# Patient Record
Sex: Female | Born: 1945 | Race: White | Hispanic: No | Smoking: Never smoker
Health system: Southern US, Community
[De-identification: ages and names within clinical notes are randomized; demographics above are authoritative.]

## PROBLEM LIST (undated history)

## (undated) DIAGNOSIS — M8440XA Pathological fracture, unspecified site, initial encounter for fracture: Secondary | ICD-10-CM

## (undated) DIAGNOSIS — C9000 Multiple myeloma not having achieved remission: Secondary | ICD-10-CM

## (undated) DIAGNOSIS — C9030 Solitary plasmacytoma not having achieved remission: Secondary | ICD-10-CM

## (undated) HISTORY — DX: Pathological fracture, unspecified site, initial encounter for fracture: M84.40XA

## (undated) HISTORY — PX: BREAST ENHANCEMENT SURGERY: SHX7

## (undated) HISTORY — DX: Solitary plasmacytoma not having achieved remission: C90.30

## (undated) HISTORY — DX: Multiple myeloma not having achieved remission: C90.00

## (undated) HISTORY — PX: BACK SURGERY: SHX140

## (undated) HISTORY — PX: LUMBAR FUSION: SHX111

---

## 2020-04-22 ENCOUNTER — Encounter: Payer: Self-pay | Admitting: *Deleted

## 2020-04-23 ENCOUNTER — Telehealth (INDEPENDENT_AMBULATORY_CARE_PROVIDER_SITE_OTHER): Payer: Medicare Other | Admitting: Registered Nurse

## 2020-04-23 ENCOUNTER — Encounter: Payer: Self-pay | Admitting: Registered Nurse

## 2020-04-23 ENCOUNTER — Other Ambulatory Visit: Payer: Self-pay

## 2020-04-23 NOTE — Patient Instructions (Signed)
° ° ° °  If you have lab work done today you will be contacted with your lab results within the next 2 weeks.  If you have not heard from us then please contact us. The fastest way to get your results is to register for My Chart. ° ° °IF you received an x-ray today, you will receive an invoice from [facility]. Please contact [facility] at [phone] with questions or concerns regarding your invoice.  ° °IF you received labwork today, you will receive an invoice from [facility]. Please contact [facility] at [phone] with questions or concerns regarding your invoice.  ° °Our billing staff will not be able to assist you with questions regarding bills from these companies. ° °You will be contacted with the lab results as soon as they are available. The fastest way to get your results is to activate your My Chart account. Instructions are located on the last page of this paperwork. If you have not heard from us regarding the results in 2 weeks, please contact this office. °  ° ° ° °

## 2020-05-01 ENCOUNTER — Other Ambulatory Visit: Payer: Self-pay

## 2020-05-01 ENCOUNTER — Ambulatory Visit: Payer: Medicare Other | Admitting: Registered Nurse

## 2020-05-01 ENCOUNTER — Encounter: Payer: Self-pay | Admitting: Registered Nurse

## 2020-05-01 VITALS — BP 170/80 | HR 104 | Temp 98.1°F | Ht 66.0 in | Wt 166.0 lb

## 2020-05-01 DIAGNOSIS — C9000 Multiple myeloma not having achieved remission: Secondary | ICD-10-CM

## 2020-05-01 DIAGNOSIS — R52 Pain, unspecified: Secondary | ICD-10-CM | POA: Diagnosis not present

## 2020-05-01 MED ORDER — GABAPENTIN 100 MG PO CAPS: 100.0000 mg | ORAL_CAPSULE | Freq: Three times a day (TID) | ORAL | 0 refills | Status: DC

## 2020-05-01 MED ORDER — MELOXICAM 15 MG PO TABS: 15.0000 mg | ORAL_TABLET | Freq: Every day | ORAL | 0 refills | Status: DC

## 2020-05-01 MED ORDER — HYDROCODONE-ACETAMINOPHEN 10-325 MG PO TABS: 1.0000 | ORAL_TABLET | ORAL | 0 refills | Status: DC | PRN

## 2020-05-01 NOTE — Progress Notes (Signed)
New Patient Office Visit  Subjective:  Patient ID: Lisa Lester, female    DOB: 04/11/1946  Age: 74 y.o. MRN: [id number]  CC:  Chief Complaint  Patient presents with  . Transitions Of Care    referral to Onocology and Pain    HPI Lisa Lester presents for visit to est care.  She is a pleasant 74 y.o. female who has recently moved to live with her family members for further support through her treatment of multiple myeloma.   Recent work up, in brief: 04/09/20: CT CAP notes posterior surgical fusion of L2 and L4 with bilateral transpedicular screws and bridging rods. Hardware intact. Underlying lytic lesion at L3 extending into L pedicle, consistent with known hx of plasmacytoma. Numerous lytic lesions noted in chest, abdomen, and pelvis. No pathologic fractures noted. T11 has stable mild superior endplate compression deformity.  04/09/20: bone survey also notes multiple small lytic lesions in skull, question of one in mandible, other findings correlated with CT CAP. Consistent with suspicion for MM.   04/10/20: labs show WBC 6.3, hgb 13.2, MCV 91, Plt 292, hypercalcemic to 11.6, normal renal function. IgG 1130, IgA dec at 48, IgM 40. SPEP with monoclonal abn peak at 0.8, serum immunofixation with IgG kappa monoclonal paraprotein, kappa lambda ratio elevated at 59.05.  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    At this time, patient is focused on two main concerns: getting established with Onc here in [place] to explore treatment options as well as getting established with Palliative Care and Pain management given the ongoing severe pain that is worst in her ribs and back. At this time she has been taking Norco 10-325 mg PO  q6h PRN, taking just about every 6 hours. Has been intermittently taking meloxicam 10m PO qd PRN and gabapentin 1071mPO tid PRN. Reports incomplete relief. Needs refills[place] prior to establishing with pain management.   Previously had been on adderall and alprazolam for controls. She has not been taking these recently. Family members report episodes of sedation and perhaps some psychosis occurring when she has taken alprazolam in the past. Pt states that most of her anxiety was surrounding recent move and ongoing pain - feels better now that she has made the move and will be setting up care.     Past Medical History:  Diagnosis Date  . [place] myel[place] ([facility] [place]  . Pathologic fracture    L3  . [facility]    Past Surgical History:  Procedure Laterality Date  . BACK SURGERY    . BREAST ENHANCEMENT SURGERY Bilateral   . LUMBAR FUSION     L2 - L4, hx of pathologic fx of L3    Family History  Problem Relation Age of Onset  . Cancer - Other Mother   . Cancer - Other Father     Social History   Socioeconomic History  . Marital status: Widowed    Spouse name: russell Gleed  . Number of children: 0  . Years of education: 1245. Highest education level: 12th grade  Occupational History  . Not on file  Tobacco Use  . Smoking status: Never Smoker  . Smokeless tobacco: Never Used  Vaping Use  .  Vaping Use: Never used  Substance and Sexual Activity  . Alcohol use: Never  . Drug use: Never  . Sexual activity: Not Currently  Other Topics Concern  . Not on file  Social History Narrative  . Not on file   Social Determinants of Health   Financial Resource Strain:   . Difficulty of Paying Living Expenses: Not on file  Food Insecurity:   . Worried About Charity fundraiser in the Last Year: Not on file  . Ran Out of Food in the Last Year: Not on file  Transportation Needs:   . Lack of Transportation (Medical): Not on  file  . Lack of Transportation (Non-Medical): Not on file  Physical Activity:   . Days of Exercise per Week: Not on file  . Minutes of Exercise per Session: Not on file  Stress:   . Feeling of Stress : Not on file  Social Connections:   . Frequency of Communication with Friends and Family: Not on file  . Frequency of Social Gatherings with Friends and Family: Not on file  . Attends Religious Services: Not on file  . Active Member of Clubs or Organizations: Not on file  . Attends Archivist Meetings: Not on file  . Marital Status: Not on file  Intimate Partner Violence:   . Fear of Current or Ex-Partner: Not on file  . Emotionally Abused: Not on file  . Physically Abused: Not on file  . Sexually Abused: Not on file    ROS Review of Systems  Constitutional: Negative.   HENT: Negative.   Eyes: Negative.   Respiratory: Negative.   Cardiovascular: Negative.   Gastrointestinal: Negative.   Genitourinary: Negative.   Musculoskeletal: Positive for arthralgias and back pain.  Skin: Negative.   Neurological: Negative.   Psychiatric/Behavioral: Negative.     Objective:   Today's Vitals: BP (!) 170/80   Pulse (!) 104   Temp 98.1 F (36.7 C) (Temporal)   Ht '5\' 6"'  (1.676 m)   Wt 166 lb (75.3 kg)   SpO2 97%   BMI 26.79 kg/m   Physical Exam Vitals and nursing note reviewed.  Constitutional:      Appearance: Normal appearance.  Cardiovascular:     Rate and Rhythm: Normal rate and regular rhythm.     Heart sounds: Normal heart sounds.  Pulmonary:     Effort: Pulmonary effort is normal. No respiratory distress.     Breath sounds: Normal breath sounds.  Musculoskeletal:     Cervical back: Normal range of motion and neck supple.  Skin:    General: Skin is warm and dry.     Capillary Refill: Capillary refill takes less than 2 seconds.  Neurological:     General: No focal deficit present.     Mental Status: She is alert and oriented to person, place, and time.  Mental status is at baseline.  Psychiatric:        Mood and Affect: Mood normal.        Behavior: Behavior normal.        Thought Content: Thought content normal.        Judgment: Judgment normal.     Assessment & Plan:   Problem List Items Addressed This Visit    None    Visit Diagnoses    Multiple myeloma not having achieved remission ([place])    -  Primary   Relevant Medications   meloxicam (MOBIC) 15 MG tablet   gabapentin (NEURONTIN) 100 MG capsule  HYDROcodone-acetaminophen (NORCO) 10-325 MG tablet   Other Relevant Orders   Ambulatory referral to Oncology   Ambulatory referral to Pain Clinic   Amb Referral to Palliative Care   Severe pain       Relevant Medications   meloxicam (MOBIC) 15 MG tablet   gabapentin (NEURONTIN) 100 MG capsule   HYDROcodone-acetaminophen (NORCO) 10-325 MG tablet   Other Relevant Orders   Ambulatory referral to Pain Clinic   Amb Referral to Palliative Care      Outpatient Encounter Medications as of 05/01/2020  Medication Sig  . alprazolam (XANAX) 2 MG tablet Take 2 mg by mouth at bedtime as needed for sleep.  Marland Kitchen HYDROcodone-acetaminophen (NORCO) 10-325 MG tablet Take 1 tablet by mouth every 4 (four) hours as needed.  . meloxicam (MOBIC) 15 MG tablet Take 1 tablet (15 mg total) by mouth daily.  . [DISCONTINUED] HYDROcodone-acetaminophen (NORCO) 10-325 MG tablet Take 1 tablet by mouth every 6 (six) hours as needed.  . [DISCONTINUED] meloxicam (MOBIC) 15 MG tablet Take 15 mg by mouth daily.  Marland Kitchen amphetamine-dextroamphetamine (ADDERALL XR) 20 MG 24 hr capsule Take 20 mg by mouth daily. (Patient not taking: Reported on 05/01/2020)  . carvedilol (COREG) 6.25 MG tablet Take 6.25 mg by mouth 2 (two) times daily with a meal. (Patient not taking: Reported on 04/23/2020)  . cyclobenzaprine (FLEXERIL) 10 MG tablet Take 10 mg by mouth 2 (two) times daily. (Patient not taking: Reported on 05/01/2020)  . gabapentin (NEURONTIN) 100 MG capsule Take 1 capsule (100  mg total) by mouth 3 (three) times daily.  . methocarbamol (ROBAXIN) 500 MG tablet Take 250 mg by mouth in the morning and at bedtime. (Patient not taking: Reported on 05/01/2020)  . [DISCONTINUED] gabapentin (NEURONTIN) 100 MG capsule Take 100 mg by mouth 3 (three) times daily. (Patient not taking: Reported on 05/01/2020)   No facility-administered encounter medications on file as of 05/01/2020.    Follow-up: No follow-ups on file.   PLAN  Refill Norco. May increase frequency to q4h but closely monitor for sedation. Please supplement with gabapentin 163m PO tid PRN, may take up to 3014mbefore bed if needed, and meloxicam 153mO qd. Discussed efficacy of pain management is difficult to achieve given the lytic lesions in the bones, which can be extremely painful. Pain medicine will be best able to suit her needs.  Refer to pain clinic and palliative care.  Refer to Onc. Patient is looking forward to establishing with [facility] and would be happy to explore all options for treatment, [facility] Patient encouraged to call clinic with any questions, comments, or concerns.  I spent 65 minutes with this patient, more than 50% of which was spent counseling and/or educating.  RicMaximiano CossP

## 2020-05-01 NOTE — Patient Instructions (Signed)
° ° ° °  If you have lab work done today you will be contacted with your lab results within the next 2 weeks.  If you have not heard from us then please contact us. The fastest way to get your results is to register for My Chart. ° ° °IF you received an x-ray today, you will receive an invoice from [facility]. Please contact [facility] at [phone] with questions or concerns regarding your invoice.  ° °IF you received labwork today, you will receive an invoice from [facility]. Please contact [facility] at [phone] with questions or concerns regarding your invoice.  ° °Our billing staff will not be able to assist you with questions regarding bills from these companies. ° °You will be contacted with the lab results as soon as they are available. The fastest way to get your results is to activate your My Chart account. Instructions are located on the last page of this paperwork. If you have not heard from us regarding the results in 2 weeks, please contact this office. °  ° ° ° °

## 2020-05-02 ENCOUNTER — Telehealth: Payer: Self-pay | Admitting: Hematology

## 2020-05-02 ENCOUNTER — Encounter: Payer: Self-pay | Admitting: Registered Nurse

## 2020-05-02 ENCOUNTER — Telehealth: Payer: Self-pay | Admitting: Hematology and Oncology

## 2020-05-02 NOTE — Telephone Encounter (Signed)
Received a new pt referral from Dr. Orland Mustard for multiple myeloma. Ms. Lisa Lester has been cld and scheduled to see Dr. Irene Limbo on 11/9 at 3pm. Pt aware to arrive 15 minutes early.

## 2020-05-02 NOTE — Telephone Encounter (Signed)
Error

## 2020-05-03 ENCOUNTER — Telehealth: Payer: Self-pay | Admitting: Hospice

## 2020-05-03 NOTE — Telephone Encounter (Signed)
Spoke with patient regarding Palliative referral/services, all questions were answered and she was in agreement with scheduling visit.  Patient wanted to call me back when her Niece, Leroy Kennedy, returned home to finish the scheduling process.  *Patient is living with nephew and niece*

## 2020-05-06 ENCOUNTER — Telehealth: Payer: Self-pay | Admitting: Hospice

## 2020-05-06 NOTE — Telephone Encounter (Signed)
Rec'd message late Friday that Leroy Kennedy (niece) had called me back about scheduling visit.  Returned call to niece and have scheduled an [place] for 05/17/20 @ 8:30 AM.

## 2020-05-07 ENCOUNTER — Inpatient Hospital Stay: Payer: Medicare Other | Attending: Hematology | Admitting: Hematology

## 2020-05-07 ENCOUNTER — Other Ambulatory Visit: Payer: Self-pay

## 2020-05-07 VITALS — BP 145/74 | HR 96 | Temp 98.8°F | Resp 17 | Ht 66.0 in | Wt 165.4 lb

## 2020-05-07 DIAGNOSIS — C9000 Multiple myeloma not having achieved remission: Secondary | ICD-10-CM | POA: Diagnosis not present

## 2020-05-07 DIAGNOSIS — Z79899 Other long term (current) drug therapy: Secondary | ICD-10-CM | POA: Insufficient documentation

## 2020-05-07 DIAGNOSIS — Z7189 Other specified counseling: Secondary | ICD-10-CM | POA: Diagnosis not present

## 2020-05-07 DIAGNOSIS — Z5111 Encounter for antineoplastic chemotherapy: Secondary | ICD-10-CM | POA: Insufficient documentation

## 2020-05-07 DIAGNOSIS — K5900 Constipation, unspecified: Secondary | ICD-10-CM | POA: Insufficient documentation

## 2020-05-07 MED ORDER — DEXAMETHASONE 4 MG PO TABS: 20.0000 mg | ORAL_TABLET | ORAL | 2 refills | Status: DC

## 2020-05-07 MED ORDER — DULOXETINE HCL 30 MG PO CPEP: 30.0000 mg | ORAL_CAPSULE | Freq: Every day | ORAL | 3 refills | Status: DC

## 2020-05-07 MED ORDER — SENNOSIDES-DOCUSATE SODIUM 8.6-50 MG PO TABS: 2.0000 | ORAL_TABLET | Freq: Every day | ORAL | 1 refills | Status: DC

## 2020-05-07 MED ORDER — POLYETHYLENE GLYCOL 3350 17 G PO PACK: 17.0000 g | PACK | Freq: Every day | ORAL | 1 refills | Status: AC

## 2020-05-07 NOTE — Progress Notes (Signed)
HEMATOLOGY/ONCOLOGY CONSULTATION NOTE  Date of Service: 05/07/2020  Patient Care Team: [name], NP as PCP - General (Adult Health Nurse Practitioner)  CHIEF COMPLAINTS/PURPOSE OF CONSULTATION:  Multiple Myeloma  HISTORY OF PRESENTING ILLNESS:  Lisa Lester is a wonderful 74 y.o. female who has been referred to [place] by Dr. 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 Pt is accompanied today by her nephew. The pt reports that she is doing well overall.   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 To treat this the pt had a Kyphoplasty and radiation therapy. Pt also had a BM Bx, urinalysis, and a bone scan completed at the time. There was no official diagnosis of MGUS or Multiple Myeloma at this time, but they continued to monitor.   A few weeks ago ptt began having significant pain in her chest, which was thought to be a fractured sternum. Pt describes the pain as "flesh being ripped off of her bones". She was also having left-sided neck and back discomfort at this time. Pt was taken to the hospital where her pain was treated and she received a right Iliac Bx, BM Bx, and CT Chest while admitted. They did not complete any whole body imaging at this time.   She had a root canal nearly 1.5 months ago. Pt discontinued taking Thyroid replacement 5-6 years ago. Pt is also no longer taking any HTN medications. She was asked to restart Gabapentin yesterday, which she was given for pain control, but felt ill after taking it. She continues taking Xanax and Hydrocodone every 4 hours. Pt reports some constipation and takes Colace occasionally.   Pt had COVID19 in July of 2020. Her husband also had the virus which caused his passing. Pt has received two doses of the COVID19 vaccine but has not received the booster.  Of note since the patient's last visit, pt has had CT Chest ([id number]) completed on  04/09/2020 with results revealing "1. Chronic posterior surgical fusion of L2 and L4 with bilateral transpedicular screws and bridging rods. Hardware intact appearing. 2. Status post vertebroplasty of L3. There is an underlying lytic lesion at the L3 vertebral body which extends into its left pedicle. These findings correlate with the provided history of a known plasmacytoma. 3. There are numerous lytic skeletal lesions at the chest, abdomen and pelvis, more numerous at the spine and ribs. 4. Multiple lytic lesions are present at the sternum, with interval development of a pathologic fracture at the sternum without significant displacement. 5. Stable mild superior endplate compression deformity at T11. 6. 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 Similar findings were present on prior study from 03/30/12, the degree of biliary ductal dilation has worsened as compared to the prior study. No choledochal stone identified.   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  Most recent lab results (04/11/2020) of CBC w/diff is as follows: all values are WNL except for Lymphs Rel at  18.5. 04/10/2020 CMP is as follows: all values are WNL except for CO2 at 33, Glucose at 120, Calcium at 11.6, Anion gap at 3.  On review of systems, pt reports constipation, chest pain, neck pain, back pain and denies dysuria and any other symptoms.   On PMHx the pt reports  Kyphoplasty, Plasmacytoma, HTN, Thyroid disorder. On Social Hx the pt reports that she is a non-smoker and only drinks alcohol socially.   MEDICAL HISTORY:  Past Medical History:  Diagnosis Date  . Multiple myeloma ([place])   . Pathologic fracture    L3  . Plasmacytoma ([place])     SURGICAL HISTORY: Past Surgical History:  Procedure Laterality Date  . BACK SURGERY    . BREAST ENHANCEMENT SURGERY Bilateral   . LUMBAR FUSION     L2 - L4, hx of pathologic fx of L3    SOCIAL HISTORY: Social History   Socioeconomic History  . Marital status: Widowed    Spouse name: russell Klink  . Number of children: 0  . Years of education: 70  . Highest education level: 12th grade  Occupational History  . Not on file  Tobacco Use  . Smoking status: Never Smoker  . Smokeless tobacco: Never Used  Vaping Use  . Vaping Use: Never used  Substance and Sexual Activity  . Alcohol use: Never  . Drug use: Never  . Sexual activity: Not Currently  Other Topics Concern  . Not on file  Social History Narrative  . Not on file   Social Determinants of Health   Financial Resource Strain:   . Difficulty of Paying Living Expenses: Not on file  Food Insecurity:   . Worried About Charity fundraiser in the Last Year: Not on file  . Ran Out of Food in the Last Year: Not on file  Transportation Needs:   . Lack of Transportation (Medical): Not on file  . Lack of Transportation (Non-Medical): Not on file  Physical Activity:   . Days of Exercise per Week: Not on file  . Minutes of Exercise per Session: Not on file  Stress:   . Feeling of Stress : Not on file  Social Connections:   . Frequency of Communication with Friends and Family: Not on file  . Frequency of Social Gatherings with Friends and Family: Not on file  . Attends Religious Services: Not on file  . Active Member of Clubs or Organizations: Not on file  . Attends Archivist Meetings: Not on file  . Marital Status: Not on file   Intimate Partner Violence:   . Fear of Current or Ex-Partner: Not on file  . Emotionally Abused: Not on file  . Physically Abused: Not on file  . Sexually Abused: Not on file    FAMILY HISTORY: Family History  Problem Relation Age of Onset  . Cancer - Other Mother   . Cancer - Other Father     ALLERGIES:  has No Known Allergies.  MEDICATIONS:  Current Outpatient Medications  Medication Sig Dispense Refill  . alprazolam (XANAX) 2 MG tablet Take 2 mg by mouth at bedtime as needed for sleep.    Marland Kitchen amphetamine-dextroamphetamine (ADDERALL XR) 20 MG 24 hr capsule Take 20 mg by mouth daily. (Patient not taking: Reported on 05/01/2020)    . carvedilol (COREG) 6.25 MG tablet Take 6.25 mg by mouth 2 (two) times daily with a meal. (Patient not taking: Reported on 04/23/2020)    . cyclobenzaprine (FLEXERIL) 10 MG tablet Take  10 mg by mouth 2 (two) times daily. (Patient not taking: Reported on 05/01/2020)    . gabapentin (NEURONTIN) 100 MG capsule Take 1 capsule (100 mg total) by mouth 3 (three) times daily. 180 capsule 0  . HYDROcodone-acetaminophen (NORCO) 10-325 MG tablet Take 1 tablet by mouth every 4 (four) hours as needed. 120 tablet 0  . meloxicam (MOBIC) 15 MG tablet Take 1 tablet (15 mg total) by mouth daily. 90 tablet 0  . methocarbamol (ROBAXIN) 500 MG tablet Take 250 mg by mouth in the morning and at bedtime. (Patient not taking: Reported on 05/01/2020)     No current facility-administered medications for this visit.    REVIEW OF SYSTEMS:    10 Point review of Systems was done is negative except as noted above.  PHYSICAL EXAMINATION: ECOG PERFORMANCE STATUS: 2 - Symptomatic, <50% confined to bed  . Vitals:   05/07/20 1519  BP: (!) 145/74  Pulse: 96  Resp: 17  Temp: 98.8 F (37.1 C)  SpO2: 98%   Filed Weights   05/07/20 1519  Weight: 165 lb 6.4 oz (75 kg)   .Body mass index is 26.7 kg/m.  GENERAL:alert, in no acute distress and comfortable SKIN: no acute rashes, no  significant lesions EYES: conjunctiva are pink and non-injected, sclera anicteric OROPHARYNX: MMM, no exudates, no oropharyngeal erythema or ulceration NECK: supple, no JVD LYMPH:  no palpable lymphadenopathy in the cervical, axillary or inguinal regions LUNGS: clear to auscultation b/l with normal respiratory effort HEART: regular rate & rhythm ABDOMEN:  normoactive bowel sounds , non tender, not distended. Extremity: no pedal edema PSYCH: alert & oriented x 3 with fluent speech NEURO: no focal motor/sensory deficits  LABORATORY DATA:  I have reviewed the data as listed  .No flowsheet data found.  .No flowsheet data found.   RADIOGRAPHIC STUDIES: I have personally reviewed the radiological images as listed and agreed with the findings in the report. No results found.  ASSESSMENT & PLAN:   74 yo with   1) Newly diagnosed multiple myeloma Previous h/o plasmacytomas PLAN: -Discussed patient's most recent labs from 04/11/2020 & 04/12/2020, no anemia, some hypercalcemia, no apparent renal dysfunction. 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  -Advised pt that this is not multiple plasmacytomas, but is active Multiple Myeloma.  -Advised pt that although Multiple Myeloma is not curable it is treatable.  -Advised pt that genetic studies are important for risk stratification in Multiple Myelomas.  -Recommend pt be cautious of increase sedation when taking Xanax with Opioids.  -Discussed CDC guidelines regarding the COVID19 booster. Recommend pt receive it soon. -Recommend pt receive the annual flu vaccine. Wait two weeks between vaccinations if possible. -Advised pt that we typically hold bisphosphonates for at least 2 months after extensive dental procedures. -Advised pt that we would prefer to complete a w/o that includes: labs, PET/CT scan, and a repeat BM Bx.  -Advised pt that we would prefer to start on Velcade + Revlimid, provided no  concerning genetic findings.  -Recommend pt take Miralax daily and 2 tables of Senna at night time.  -Plan to begin Zometa in 1-2 weeks.  -Rx Cymbalta, Senna w/stool softener, Dexamethasone -Will get BM Bx and labs  -Will get PET/CT    FOLLOW UP: Labs within 1-2 days PET/CT ASAP in 1 weeks CT bone marrow aspiration and biopsy in 1 week Chemo-counseling for VRD and zometa Schedule to start VRD+ Zometa in 2 weeks with labs and MD visit  All of the patients questions were answered with apparent satisfaction. The patient knows to call the clinic with any problems, questions or concerns.  I spent 45 mins counseling the patient face to face. The total time spent in the appointment was 80 minutes and more than 50% was on counseling and direct patient cares, co-ordinating cares, ordering tests and treatment.    Sullivan Lone MD [place] AAHIVMS [facility] Hematology/Oncology Physician [facility]  (Office):       [phone] (Work cell):  [phone] (Fax):           [phone]  05/07/2020 11:05 AM  I, Yevette Edwards, am acting as a scribe for Dr. Sullivan Lone.   .I have reviewed the above documentation for accuracy and completeness, and I agree with the above. Brunetta Genera MD

## 2020-05-13 ENCOUNTER — Telehealth: Payer: Self-pay | Admitting: *Deleted

## 2020-05-13 DIAGNOSIS — C9000 Multiple myeloma not having achieved remission: Secondary | ICD-10-CM | POA: Insufficient documentation

## 2020-05-13 DIAGNOSIS — Z7189 Other specified counseling: Secondary | ICD-10-CM | POA: Insufficient documentation

## 2020-05-13 MED ORDER — LENALIDOMIDE 15 MG PO CAPS: 15.0000 mg | ORAL_CAPSULE | Freq: Every day | ORAL | 0 refills | Status: DC

## 2020-05-13 MED ORDER — PROCHLORPERAZINE MALEATE 10 MG PO TABS: 10.0000 mg | ORAL_TABLET | Freq: Four times a day (QID) | ORAL | 1 refills | Status: DC | PRN

## 2020-05-13 MED ORDER — DEXAMETHASONE 4 MG PO TABS: ORAL_TABLET | ORAL | 5 refills | Status: DC

## 2020-05-13 MED ORDER — ASPIRIN EC 325 MG PO TBEC: 325.0000 mg | DELAYED_RELEASE_TABLET | Freq: Every day | ORAL | 5 refills | Status: DC

## 2020-05-13 MED ORDER — ONDANSETRON HCL 8 MG PO TABS: 8.0000 mg | ORAL_TABLET | Freq: Two times a day (BID) | ORAL | 1 refills | Status: DC | PRN

## 2020-05-13 MED ORDER — ACYCLOVIR 400 MG PO TABS: 400.0000 mg | ORAL_TABLET | Freq: Two times a day (BID) | ORAL | 3 refills | Status: DC

## 2020-05-13 NOTE — Telephone Encounter (Signed)
Niece Leroy Kennedy called on patient's behalf. Patient seen by Dr. Irene Limbo 11/9. Per discussion with Dr. Irene Limbo, patient to have PET, biopsy and lab work done.  When will that be scheduled? Dr. Irene Limbo informed.  Per Dr. Irene Limbo, labs to be done in next few days (schedule message sent). PET and biopsy to be scheduled by radiology once authorized. Contacted Ms. Greer with this information - left voice mail and asked that office be contacted by mid week if they have not been contacted by scheduling

## 2020-05-13 NOTE — Progress Notes (Signed)
START ON PATHWAY REGIMEN - Multiple Myeloma and Other Plasma Cell Dyscrasias     A cycle is every 21 days:     Bortezomib      Lenalidomide      Dexamethasone   **Always confirm dose/schedule in your pharmacy ordering system**  Patient Characteristics: Multiple Myeloma, Newly Diagnosed, Transplant Eligible, Unknown or Awaiting Test Results Disease Classification: Multiple Myeloma R-ISS Staging: Unknown Therapeutic Status: Newly Diagnosed Is Patient Eligible for Transplant<= Transplant Eligible Risk Status: Awaiting Test Results Intent of Therapy: Non-Curative / Palliative Intent, Discussed with Patient 

## 2020-05-15 ENCOUNTER — Telehealth: Payer: Self-pay

## 2020-05-15 ENCOUNTER — Other Ambulatory Visit: Payer: Self-pay

## 2020-05-15 ENCOUNTER — Inpatient Hospital Stay: Payer: Medicare Other

## 2020-05-15 ENCOUNTER — Telehealth: Payer: Self-pay | Admitting: Pharmacist

## 2020-05-15 ENCOUNTER — Encounter: Payer: Self-pay | Admitting: Hematology

## 2020-05-15 DIAGNOSIS — C9000 Multiple myeloma not having achieved remission: Secondary | ICD-10-CM | POA: Diagnosis not present

## 2020-05-15 DIAGNOSIS — K5900 Constipation, unspecified: Secondary | ICD-10-CM | POA: Diagnosis not present

## 2020-05-15 DIAGNOSIS — Z79899 Other long term (current) drug therapy: Secondary | ICD-10-CM | POA: Diagnosis not present

## 2020-05-15 DIAGNOSIS — Z5111 Encounter for antineoplastic chemotherapy: Secondary | ICD-10-CM | POA: Diagnosis not present

## 2020-05-15 DIAGNOSIS — Z7189 Other specified counseling: Secondary | ICD-10-CM

## 2020-05-15 LAB — CMP (CANCER CENTER ONLY)
ALT: 8 U/L (ref 0–44)
AST: 10 U/L — ABNORMAL LOW (ref 15–41)
Albumin: 3.7 g/dL (ref 3.5–5.0)
Alkaline Phosphatase: 106 U/L (ref 38–126)
Anion gap: 9 (ref 5–15)
BUN: 15 mg/dL (ref 8–23)
CO2: 29 mmol/L (ref 22–32)
Calcium: 9.6 mg/dL (ref 8.9–10.3)
Chloride: 100 mmol/L (ref 98–111)
Creatinine: 0.98 mg/dL (ref 0.44–1.00)
GFR, Estimated: >60 mL/min (ref >60)
Glucose, Bld: 108 mg/dL — ABNORMAL HIGH (ref 70–99)
Potassium: 4 mmol/L (ref 3.5–5.1)
Sodium: 138 mmol/L (ref 135–145)
Total Bilirubin: 0.4 mg/dL (ref 0.3–1.2)
Total Protein: 7.1 g/dL (ref 6.5–8.1)

## 2020-05-15 LAB — CBC WITH DIFFERENTIAL/PLATELET
Abs Immature Granulocytes: 0.05 10*3/uL (ref 0.00–0.07)
Basophils Absolute: 0.1 10*3/uL (ref 0.0–0.1)
Basophils Relative: 1 %
Eosinophils Absolute: 0.1 10*3/uL (ref 0.0–0.5)
Eosinophils Relative: 2 %
HCT: 36.7 % (ref 36.0–46.0)
Hemoglobin: 12.1 g/dL (ref 12.0–15.0)
Immature Granulocytes: 1 %
Lymphocytes Relative: 35 %
Lymphs Abs: 1.9 10*3/uL (ref 0.7–4.0)
MCH: 30.4 pg (ref 26.0–34.0)
MCHC: 33 g/dL (ref 30.0–36.0)
MCV: 92.2 fL (ref 80.0–100.0)
Monocytes Absolute: 0.5 10*3/uL (ref 0.1–1.0)
Monocytes Relative: 9 %
Neutro Abs: 2.9 10*3/uL (ref 1.7–7.7)
Neutrophils Relative %: 52 %
Platelets: 329 10*3/uL (ref 150–400)
RBC: 3.98 MIL/uL (ref 3.87–5.11)
RDW: 12.8 % (ref 11.5–15.5)
WBC: 5.6 10*3/uL (ref 4.0–10.5)
nRBC: 0 % (ref 0.0–0.2)

## 2020-05-15 LAB — HEPATITIS C ANTIBODY: HCV Ab: NONREACTIVE

## 2020-05-15 LAB — SAMPLE TO BLOOD BANK

## 2020-05-15 LAB — HEPATITIS B SURFACE ANTIGEN: Hepatitis B Surface Ag: NONREACTIVE

## 2020-05-15 LAB — HEPATITIS B CORE ANTIBODY, TOTAL: Hep B Core Total Ab: NONREACTIVE

## 2020-05-15 LAB — SEDIMENTATION RATE: Sed Rate: 27 mm/hr — ABNORMAL HIGH (ref 0–22)

## 2020-05-15 MED ORDER — LENALIDOMIDE 15 MG PO CAPS: 15.0000 mg | ORAL_CAPSULE | Freq: Every day | ORAL | 0 refills | Status: DC

## 2020-05-15 NOTE — Progress Notes (Signed)
Met with patient at registration to introduce myself as Financial Resource Specialist and to offer available resources.  Discussed one-time $1000 [employer] grant and qualifications to assist with personal expenses while going through treatment.  Gave her my card if interested in applying and for any additional financial questions or concerns.   

## 2020-05-15 NOTE — Telephone Encounter (Signed)
Oral Oncology Pharmacist Encounter  Received new prescription for Revlimid (lenalidomide) for the treatment of newly diagnosed multiple myeloma in conjunction with Velcade and dexamethasone, planned for duration of induction therapy or unacceptable drug toxicity.  CBC w/ Diff from 04/11/20 and BMP from 04/12/20 assessed, labs overall stable. Repeat baseline labs are ordered prior to therapy initiation. Patient also scheduled for PET/CT scan and repeat BM Bx prior to planned initiation of therapy.   Current medication list in Epic reviewed, no relevant/significant DDIs with Revlimid identified.  Evaluated chart and no patient barriers to medication adherence noted.   Patient will fill Revlimid through [facility] due to insurance requirements and given that Revlimid is a limited distribution medication.   Oral Oncology Clinic will continue to follow for insurance authorization, copayment issues, initial counseling and start date.  Lisa Lester, PharmD, BCPS Hematology/Oncology Clinical Pharmacist [facility] [phone] 05/15/2020 12:19 PM

## 2020-05-15 NOTE — Telephone Encounter (Signed)
**Note Lisa-Identified via Obfuscation** Oral Oncology Patient Advocate Encounter  Received notification from [facility] that prior authorization for Revlimid is required.  PA submitted on CoverMyMeds Key [id number] Status is pending  Oral Oncology Clinic will continue to follow.  Lisa Lester Patient [place] Phone [phone] Fax [phone] 05/15/2020 10:55 AM

## 2020-05-16 ENCOUNTER — Other Ambulatory Visit: Payer: Self-pay | Admitting: Hematology

## 2020-05-16 ENCOUNTER — Encounter: Payer: Self-pay | Admitting: Hematology

## 2020-05-16 LAB — MULTIPLE MYELOMA PANEL, SERUM
Albumin SerPl Elph-Mcnc: 3.5 g/dL (ref 2.9–4.4)
Albumin/Glob SerPl: 1.2 (ref 0.7–1.7)
Alpha 1: 0.3 g/dL (ref 0.0–0.4)
Alpha2 Glob SerPl Elph-Mcnc: 0.9 g/dL (ref 0.4–1.0)
B-Globulin SerPl Elph-Mcnc: 0.9 g/dL (ref 0.7–1.3)
Gamma Glob SerPl Elph-Mcnc: 1 g/dL (ref 0.4–1.8)
Globulin, Total: 3.1 g/dL (ref 2.2–3.9)
IgA: 36 mg/dL — ABNORMAL LOW (ref 64–422)
IgG (Immunoglobin G), Serum: 1000 mg/dL (ref 586–1602)
IgM (Immunoglobulin M), Srm: 35 mg/dL (ref 26–217)
M Protein SerPl Elph-Mcnc: 0.7 g/dL — ABNORMAL HIGH
Total Protein ELP: 6.6 g/dL (ref 6.0–8.5)

## 2020-05-16 LAB — BETA 2 MICROGLOBULIN, SERUM: Beta-2 Microglobulin: 3.1 mg/L — ABNORMAL HIGH (ref 0.6–2.4)

## 2020-05-16 LAB — KAPPA/LAMBDA LIGHT CHAINS
Kappa free light chain: 865.8 mg/L — ABNORMAL HIGH (ref 3.3–19.4)
Kappa, lambda light chain ratio: 105.59 — ABNORMAL HIGH (ref 0.26–1.65)
Lambda free light chains: 8.2 mg/L (ref 5.7–26.3)

## 2020-05-16 MED ORDER — FENTANYL 12 MCG/HR TD PT72: 1.0000 | MEDICATED_PATCH | TRANSDERMAL | 0 refills | Status: DC

## 2020-05-17 ENCOUNTER — Other Ambulatory Visit: Payer: Medicare Other | Admitting: Hospice

## 2020-05-17 ENCOUNTER — Other Ambulatory Visit: Payer: Self-pay

## 2020-05-17 DIAGNOSIS — Z515 Encounter for palliative care: Secondary | ICD-10-CM

## 2020-05-17 DIAGNOSIS — C9000 Multiple myeloma not having achieved remission: Secondary | ICD-10-CM | POA: Diagnosis not present

## 2020-05-17 NOTE — Progress Notes (Signed)
[facility] Consult Note Telephone: [phone]  Fax: [phone]  PATIENT NAME: Lisa Lester [address] [place] [address] [phone] (home)  DOB: [birth year] MRN: [id number]  PRIMARY CARE PROVIDER:    [name], NP,  [place] [address] [phone]  REFERRING PROVIDER:   [name], NP [place],  [place] [address] [phone]  RESPONSIBLE PARTY: Self HCPOA/ Extended Emergency Contact Information Primary Emergency Contact: greer,doug Mobile Phone: [phone] Relation: Nephew Preferred language: English Interpreter needed? No Secondary Emergency Contact: greer,julie Mobile Phone: [phone] Relation: Niece Preferred language: English Interpreter needed? No  [name] is a Therapist, sports  I met face to face with patient and family in home/facility.  RECOMMENDATIONS/PLAN:    Visit at the request of [name], NP  for palliative consult. Visit consisted of building trust and discussions on Palliative Medicine as specialized medical care for people living with serious illness, aimed at facilitating better quality of life through symptoms relief, assisting with advance care plan and establishing goals of care.  Patient relocated from [place] to [place] to be with family as she goes through treatment for multiple myeloma.  Her niece [name] was present during visit. Visit consisted of counseling and education dealing with the complex and emotionally intense issues of symptom management and palliative care in the setting of serious and potentially life-threatening illness.  She mentioned a few things that help her to cope: Her dog Ahlee, letting go of her emotions, talking about her challenges, her spirituality, among others.  Encouraged her to draw on these coping mechanisms as she undergoes her treatment.  Therapeutic listening and ample emotional support provided.  Palliative care team  will continue to support patient, patient's family, and medical team.  Discussion on the difference between Palliative and Hospice care. Palliative care and hospice have similar goals of managing symptoms, promoting comfort, improving quality of life, and maintaining a person's dignity. However, palliative care may be offered during any phase of a serious illness, while hospice care is usually offered when a person is expected to live for 6 months or less.  Advance Care Planning: Our advance care planning conversation included a discussion about:    The value and importance of advance care planning  Exploration of goals of care in the event of a sudden injury or illness  Identification and preparation of a healthcare agent  Review and updating or creation of an  advance directive document  CODE STATUS: Ramifications and implications of CODE STATUS discussed.  Patient affirmed she is a full code.   GOALS OF CARE: Goals of care include to maximize quality of life and symptom management.  Follow up Palliative Care Visit: Palliative care will continue to follow for goals of care clarification and symptom management.  Patient requested follow-up after the holidays-in 6 weeks/as needed  Functional decline/Symptom Management:  Pain: Generalized body pain.  Continue hydrocodone, Cymbalta as ordered.  Effective.  Patient denied pain during visit, in no respiratory distress. Chemo planned to commence 11/30.2021, PET scan planned for 05/24/2020.  Education provided on likely side effects of chemo and how to manage dose.  Also, encouraged balance of rest and performance activity.  Balanced diet rich in fruits and vegetables encouraged. Anxiety: Encouraged deep breathing, meditation.  Continue Xanax as ordered. She is ambulatory without assistive device.  Palliative will continue to monitor for symptom management/decline and make recommendations as needed.   Family /Caregiver/Community Supports: Strong  family support system identified.  Patient moved from [place] to [place] for treatment.  She lives at home with her nephew [name] and his wife [name].  They are involved in patient's care. ° °I spent 1 hour and twenty-five minutes providing this initial consultation; time includes time spent with patient/family, chart review, provider coordination,  and documentation. More than 50% of the time in this consultation was spent on coordinating communication ° °CHIEF COMPLAIN/HISTORY OF PRESENT ILLNESS:  Lisa Lester is a 74 y.o. female with multiple medical problems including multiple myeloma, generalized body pain, anxiety. Palliative Care was asked to follow this patient by consultation request of Morrow, Richard, NP to help address advance care planning and goals of care. ° °CODE STATUS: Full code ° °PPS: 60% ° °HOSPICE ELIGIBILITY/DIAGNOSIS: TBD ° °PAST MEDICAL HISTORY:  °Past Medical History:  °Diagnosis Date  °• Multiple myeloma (HCC)   °• Pathologic fracture   ° L3  °• Plasmacytoma (HCC)   °  °SOCIAL HX:  °Social History  ° °Tobacco Use  °• Smoking status: Never Smoker  °• Smokeless tobacco: Never Used  °Substance Use Topics  °• Alcohol use: Never  ° °FAMILY HX:  °Family History  °Problem Relation Age of Onset  °• Cancer - Other Mother   °• Cancer - Other Father   ° ° °ALLERGIES: No Known Allergies   °PERTINENT MEDICATIONS:  °Outpatient Encounter Medications as of 05/17/2020  °Medication Sig  °• acyclovir (ZOVIRAX) 400 MG tablet Take 1 tablet (400 mg total) by mouth 2 (two) times daily.  °• alprazolam (XANAX) 2 MG tablet Take 2 mg by mouth at bedtime as needed for sleep.  °• amphetamine-dextroamphetamine (ADDERALL XR) 20 MG 24 hr capsule Take 20 mg by mouth daily. (Patient not taking: Reported on 05/01/2020)  °• aspirin EC 325 MG tablet Take 1 tablet (325 mg total) by mouth daily.  °• carvedilol (COREG) 6.25 MG tablet Take 6.25 mg by mouth 2 (two) times daily with a meal. (Patient not taking: Reported on  04/23/2020)  °• cyclobenzaprine (FLEXERIL) 10 MG tablet Take 10 mg by mouth 2 (two) times daily. (Patient not taking: Reported on 05/01/2020)  °• dexamethasone (DECADRON) 4 MG tablet Take 5 tablets (20 mg total) by mouth once a week.  °• dexamethasone (DECADRON) 4 MG tablet Take 5 tablets (20 mg) on D15 of each cycle. Take with breakfast  °• DULoxetine (CYMBALTA) 30 MG capsule Take 1 capsule (30 mg total) by mouth daily.  °• fentaNYL (DURAGESIC) 12 MCG/HR Place 1 patch onto the skin every 3 (three) days.  °• gabapentin (NEURONTIN) 100 MG capsule Take 1 capsule (100 mg total) by mouth 3 (three) times daily. (Patient not taking: Reported on 05/07/2020)  °• HYDROcodone-acetaminophen (NORCO) 10-325 MG tablet Take 1 tablet by mouth every 4 (four) hours as needed.  °• lenalidomide (REVLIMID) 15 MG capsule Take 1 capsule (15 mg total) by mouth daily. Take for 14 days on, 7 days off. Repeat every 21 days. [employer] Auth# [id number]; Date obtained 05/15/20  °• meloxicam (MOBIC) 15 MG tablet Take 1 tablet (15 mg total) by mouth daily.  °• methocarbamol (ROBAXIN) 500 MG tablet Take 250 mg by mouth in the morning and at bedtime. (Patient not taking: Reported on 05/01/2020)  °• ondansetron (ZOFRAN) 8 MG tablet Take 1 tablet (8 mg total) by mouth 2 (two) times daily as needed (Nausea or vomiting).  °• polyethylene glycol (MIRALAX) 17 g packet Take 17 g by mouth daily.  °• prochlorperazine (COMPAZINE) 10 MG tablet Take 1 tablet (10   mg total) by mouth every 6 (six) hours as needed (Nausea or vomiting).   senna-docusate (SENNA S) 8.6-50 MG tablet Take 2 tablets by mouth at bedtime.   No facility-administered encounter medications on file as of 05/17/2020.    PHYSICAL EXAM / ROS: BP 132/82 P 92 02 99% R 18 Height:    5 feet 5 inches  Weight: 155Lbs General: In no acute distress, pleasant, cooperative Cardiovascular: regular rate and rhythm, no chest pain reported Pulmonary: no cough, no shortness of breath, clear ant/post fields,  normal respiratory effort Abdomen: soft, non tender, positive bowel sounds in all quadrants GU: denies dysuria, no suprapubic tenderness Extremities: no edema, no joint deformities Skin: no rashes to exposed skin Neurological: Weakness but otherwise non focal  Teodoro Spray, NP

## 2020-05-20 ENCOUNTER — Other Ambulatory Visit: Payer: Self-pay | Admitting: Radiology

## 2020-05-20 ENCOUNTER — Encounter: Payer: Self-pay | Admitting: Hematology

## 2020-05-20 NOTE — Telephone Encounter (Signed)
Oral Chemotherapy Pharmacist Encounter  I spoke with patient for overview of: Revlimid for the treatment of newly diagnosed multiple myeloma in conjunction with Velcade and dexamethasone, planned for duration of induction therapy or unacceptable drug toxicity   Counseled patient on administration, dosing, side effects, monitoring, drug-food interactions, safe handling, storage, and disposal.  Patient will take Revlimid 65m capsules, 1 capsule by mouth once daily, without regard to food, with a full glass of water.  Revlimid will be given 14 days on, 7 days off, repeat every 21 days.  Patient will take dexamethasone 443mtablets, 5 tablets (2063mby mouth once weekly with breakfast.  Revlimid start date: 05/28/20  Adverse effects of Revlimid include but are not limited to: nausea, constipation, diarrhea, abdominal pain, rash, fatigue, drug fever, and decreased blood counts.    Reviewed with patient importance of keeping a medication schedule and plan for any missed doses. No barriers to medication adherence identified.  Medication reconciliation performed and medication/allergy list updated.  Patient has picked up acyclovir and dexamethasone prescriptions. Patient stated she started dexamethasone on 05/07/20 - MD notified.  Patient counseled on importance of daily aspirin 325m43mr VTE prophylaxis.  Insurance authorization for Revlimid has been obtained.  [facility] as it is a limited distribution medication. Patient provided phone number to set up shipment with their pharmacy (phone number: [phone] #3). Patient verbalized understanding that she will not start taking the Revlimid until 05/28/20.  All questions answered.  Ms. LuecMclucasced understanding and appreciation.   Medication education handout placed in mail for patient. Patient knows to call the office with questions or concerns. Oral Chemotherapy Clinic  phone number provided to patient.   RebeLeron CroakarmD, BCPS Hematology/Oncology Clinical Pharmacist [facility]-[phone] 511222/2021 2:15 PM

## 2020-05-21 ENCOUNTER — Telehealth: Payer: Self-pay | Admitting: *Deleted

## 2020-05-21 ENCOUNTER — Other Ambulatory Visit: Payer: Self-pay | Admitting: Hematology

## 2020-05-21 LAB — UPEP/UIFE/LIGHT CHAINS/TP, 24-HR UR
% BETA, Urine: 0 %
ALPHA 1 URINE: 0 %
Albumin, U: 0 %
Alpha 2, Urine: 0 %
Free Kappa Lt Chains,Ur: 25.51 mg/L (ref 0.63–113.79)
Free Kappa/Lambda Ratio: 16.04 (ref 1.03–31.76)
Free Lambda Lt Chains,Ur: 1.59 mg/L (ref 0.47–11.77)
GAMMA GLOBULIN URINE: 0 %
Total Protein, Urine-Ur/day: 75 mg/24 hr (ref 30–150)
Total Protein, Urine: 5.8 mg/dL
Total Volume: 1300

## 2020-05-21 MED ORDER — OXYCODONE HCL 5 MG PO TABS
5.0000 mg | ORAL_TABLET | ORAL | 0 refills | Status: DC | PRN
Start: 2020-05-21 — End: 2020-05-28

## 2020-05-21 NOTE — Progress Notes (Signed)
Pharmacist Chemotherapy Monitoring - Initial Assessment    Anticipated start date: 05/28/2020   Regimen:  . Are orders appropriate based on the patient's diagnosis, regimen, and cycle? Yes . Does the plan date match the patient's scheduled date? Yes . Is the sequencing of drugs appropriate? Yes . Are the premedications appropriate for the patient's regimen? Yes . Prior Authorization for treatment is: Approved o If applicable, is the correct biosimilar selected based on the patient's insurance? not applicable  Organ Function and Labs: Marland Kitchen Are dose adjustments needed based on the patient's renal function, hepatic function, or hematologic function? Yes . Are appropriate labs ordered prior to the start of patient's treatment? Yes . Other organ system assessment, if indicated: N/A . The following baseline labs, if indicated, have been ordered: N/A  Dose Assessment: . Are the drug doses appropriate? Yes . Are the following correct: o Drug concentrations Yes o IV fluid compatible with drug Yes o Administration routes Yes o Timing of therapy Yes . If applicable, does the patient have documented access for treatment and/or plans for port-a-cath placement? not applicable . If applicable, have lifetime cumulative doses been properly documented and assessed? not applicable Lifetime Dose Tracking  No doses have been documented on this patient for the following tracked chemicals: Doxorubicin, Epirubicin, Idarubicin, Daunorubicin, Mitoxantrone, Bleomycin, Oxaliplatin, Carboplatin, Liposomal Doxorubicin  o   Toxicity Monitoring/Prevention: . The patient has the following take home antiemetics prescribed: Ondansetron, Prochlorperazine and Dexamethasone . The patient has the following take home medications prescribed: N/A . Medication allergies and previous infusion related reactions, if applicable, have been reviewed and addressed. No . The patient's current medication list has been assessed for  drug-drug interactions with their chemotherapy regimen. no significant drug-drug interactions were identified on review.  Order Review: . Are the treatment plan orders signed? Yes . Is the patient scheduled to see a provider prior to their treatment? Yes  I verify that I have reviewed each item in the above checklist and answered each question accordingly.  Kammi Hechler D 05/21/2020 4:18 PM

## 2020-05-21 NOTE — Telephone Encounter (Signed)
Wearing Fentanyl patch, says it does not decrease pain very much. Still 8/10. Ran out of hydrocodone (Norco 10-324 q4 PRN), was taking for breakthrough pain. Norco was initally prescribed by primary provider. Dr. Irene Limbo informed Contacted patient to let her know Dr. Irene Limbo sent in prescription for oxycodone IR for breakthrough pain. Advised patient to update office on effectiveness of pain medicine. Patient verbalized understanding.

## 2020-05-24 ENCOUNTER — Ambulatory Visit (HOSPITAL_COMMUNITY)
Admission: RE | Admit: 2020-05-24 | Discharge: 2020-05-24 | Disposition: A | Discharge status: Home / Self Care | Payer: Medicare Other | Source: Ambulatory Visit | Attending: Hematology | Admitting: Hematology

## 2020-05-24 ENCOUNTER — Other Ambulatory Visit: Payer: Self-pay

## 2020-05-24 DIAGNOSIS — C9000 Multiple myeloma not having achieved remission: Secondary | ICD-10-CM | POA: Insufficient documentation

## 2020-05-24 LAB — GLUCOSE, CAPILLARY: Glucose-Capillary: 110 mg/dL — ABNORMAL HIGH (ref 70–99)

## 2020-05-24 MED ORDER — FLUDEOXYGLUCOSE F - 18 (FDG) INJECTION
7.8000 | Freq: Once | INTRAVENOUS | Status: AC | PRN
  Administered 2020-05-24: 7.8 via INTRAVENOUS

## 2020-05-27 ENCOUNTER — Other Ambulatory Visit: Payer: Self-pay

## 2020-05-27 ENCOUNTER — Ambulatory Visit (HOSPITAL_COMMUNITY)
Admission: RE | Admit: 2020-05-27 | Discharge: 2020-05-27 | Disposition: A | Discharge status: Home / Self Care | Payer: Medicare Other | Source: Ambulatory Visit | Attending: Hematology | Admitting: Hematology

## 2020-05-27 ENCOUNTER — Other Ambulatory Visit: Payer: Self-pay | Admitting: Hematology

## 2020-05-27 ENCOUNTER — Encounter (HOSPITAL_COMMUNITY): Payer: Self-pay

## 2020-05-27 DIAGNOSIS — C9000 Multiple myeloma not having achieved remission: Secondary | ICD-10-CM

## 2020-05-27 DIAGNOSIS — Z79899 Other long term (current) drug therapy: Secondary | ICD-10-CM | POA: Diagnosis not present

## 2020-05-27 DIAGNOSIS — Z7901 Long term (current) use of anticoagulants: Secondary | ICD-10-CM | POA: Insufficient documentation

## 2020-05-27 LAB — PROTIME-INR
INR: 0.9 (ref 0.8–1.2)
Prothrombin Time: 11.9 seconds (ref 11.4–15.2)

## 2020-05-27 LAB — CBC WITH DIFFERENTIAL/PLATELET
Abs Immature Granulocytes: 0.02 10*3/uL (ref 0.00–0.07)
Basophils Absolute: 0 10*3/uL (ref 0.0–0.1)
Basophils Relative: 1 %
Eosinophils Absolute: 0.1 10*3/uL (ref 0.0–0.5)
Eosinophils Relative: 2 %
HCT: 38.8 % (ref 36.0–46.0)
Hemoglobin: 12.7 g/dL (ref 12.0–15.0)
Immature Granulocytes: 0 %
Lymphocytes Relative: 28 %
Lymphs Abs: 1.4 10*3/uL (ref 0.7–4.0)
MCH: 30.5 pg (ref 26.0–34.0)
MCHC: 32.7 g/dL (ref 30.0–36.0)
MCV: 93 fL (ref 80.0–100.0)
Monocytes Absolute: 0.4 10*3/uL (ref 0.1–1.0)
Monocytes Relative: 8 %
Neutro Abs: 2.9 10*3/uL (ref 1.7–7.7)
Neutrophils Relative %: 61 %
Platelets: 271 10*3/uL (ref 150–400)
RBC: 4.17 MIL/uL (ref 3.87–5.11)
RDW: 13.2 % (ref 11.5–15.5)
WBC: 4.8 10*3/uL (ref 4.0–10.5)
nRBC: 0 % (ref 0.0–0.2)

## 2020-05-27 MED ORDER — MIDAZOLAM HCL 2 MG/2ML IJ SOLN
INTRAMUSCULAR | Status: AC
  Filled 2020-05-27: qty 4

## 2020-05-27 MED ORDER — DIPHENHYDRAMINE HCL 50 MG/ML IJ SOLN
INTRAMUSCULAR | Status: AC
  Filled 2020-05-27: qty 1

## 2020-05-27 MED ORDER — FENTANYL CITRATE (PF) 100 MCG/2ML IJ SOLN
INTRAMUSCULAR | Status: AC
  Filled 2020-05-27: qty 2

## 2020-05-27 MED ORDER — DIPHENHYDRAMINE HCL 50 MG/ML IJ SOLN
INTRAMUSCULAR | Status: AC | PRN
  Administered 2020-05-27: 25 mg via INTRAVENOUS

## 2020-05-27 MED ORDER — SODIUM CHLORIDE 0.9 % IV SOLN: INTRAVENOUS | Status: DC

## 2020-05-27 MED ORDER — FENTANYL CITRATE (PF) 100 MCG/2ML IJ SOLN
INTRAMUSCULAR | Status: AC | PRN
Start: 2020-05-27 — End: 2020-05-27
  Administered 2020-05-27 (×2): 50 ug via INTRAVENOUS

## 2020-05-27 MED ORDER — MIDAZOLAM HCL 2 MG/2ML IJ SOLN
INTRAMUSCULAR | Status: AC | PRN
  Administered 2020-05-27 (×2): 1 mg via INTRAVENOUS

## 2020-05-27 MED ORDER — FLUMAZENIL 0.5 MG/5ML IV SOLN
INTRAVENOUS | Status: AC
  Filled 2020-05-27: qty 5

## 2020-05-27 MED ORDER — LIDOCAINE-EPINEPHRINE (PF) 1 %-1:200000 IJ SOLN
INTRAMUSCULAR | Status: AC | PRN
  Administered 2020-05-27: 10 mL via INTRADERMAL

## 2020-05-27 MED ORDER — NALOXONE HCL 0.4 MG/ML IJ SOLN
INTRAMUSCULAR | Status: AC
  Filled 2020-05-27: qty 1

## 2020-05-27 NOTE — Discharge Instructions (Addendum)
Please call Interventional Radiology clinic [phone] with any questions or concerns.  You may remove your dressing and shower tomorrow.   Bone Marrow Aspiration and Bone Marrow Biopsy, Adult, Care After This sheet gives you information about how to care for yourself after your procedure. Your health care provider may also give you more specific instructions. If you have problems or questions, contact your health care provider. What can I expect after the procedure? After the procedure, it is common to have:  Mild pain and tenderness.  Swelling.  Bruising. Follow these instructions at home: Puncture site care   Follow instructions from your health care provider about how to take care of the puncture site. Make sure you: ? Wash your hands with soap and water before and after you change your bandage (dressing). If soap and water are not available, use hand sanitizer. ? Change your dressing as told by your health care provider.  Check your puncture site every day for signs of infection. Check for: ? More redness, swelling, or pain. ? Fluid or blood. ? Warmth. ? Pus or a bad smell. Activity  Return to your normal activities as told by your health care provider. Ask your health care provider what activities are safe for you.  Do not lift anything that is heavier than 10 lb (4.5 kg), or the limit that you are told, until your health care provider says that it is safe.  Do not drive for 24 hours if you were given a sedative during your procedure. General instructions   Take over-the-counter and prescription medicines only as told by your health care provider.  Do not take baths, swim, or use a hot tub until your health care provider approves. Ask your health care provider if you may take showers. You may only be allowed to take sponge baths.  If directed, put ice on the affected area. To do this: ? Put ice in a plastic bag. ? Place a towel between your skin and the  bag. ? Leave the ice on for 20 minutes, 2-3 times a day.  Keep all follow-up visits as told by your health care provider. This is important. Contact a health care provider if:  Your pain is not controlled with medicine.  You have a fever.  You have more redness, swelling, or pain around the puncture site.  You have fluid or blood coming from the puncture site.  Your puncture site feels warm to the touch.  You have pus or a bad smell coming from the puncture site. Summary  After the procedure, it is common to have mild pain, tenderness, swelling, and bruising.  Follow instructions from your health care provider about how to take care of the puncture site and what activities are safe for you.  Take over-the-counter and prescription medicines only as told by your health care provider.  Contact a health care provider if you have any signs of infection, such as fluid or blood coming from the puncture site. This information is not intended to replace advice given to you by your health care provider. Make sure you discuss any questions you have with your health care provider. Document Revised: 11/01/2018 Document Reviewed: 11/01/2018 Elsevier Patient Education  2020 [employer]   Moderate Conscious Sedation, Adult, Care After These instructions provide you with information about caring for yourself after your procedure. Your health care provider may also give you more specific instructions. Your treatment has been planned according to current medical practices, but problems sometimes occur. Call   your health care provider if you have any problems or questions after your procedure. What can I expect after the procedure? After your procedure, it is common:  To feel sleepy for several hours.  To feel clumsy and have poor balance for several hours.  To have poor judgment for several hours.  To vomit if you eat too soon. Follow these instructions at home: For at least 24 hours after  the procedure:   Do not: ? Participate in activities where you could fall or become injured. ? Drive. ? Use heavy machinery. ? Drink alcohol. ? Take sleeping pills or medicines that cause drowsiness. ? Make important decisions or sign legal documents. ? Take care of children on your own.  Rest. Eating and drinking  Follow the diet recommended by your health care provider.  If you vomit: ? Drink water, juice, or soup when you can drink without vomiting. ? Make sure you have little or no nausea before eating solid foods. General instructions  Have a responsible adult stay with you until you are awake and alert.  Take over-the-counter and prescription medicines only as told by your health care provider.  If you smoke, do not smoke without supervision.  Keep all follow-up visits as told by your health care provider. This is important. Contact a health care provider if:  You keep feeling nauseous or you keep vomiting.  You feel light-headed.  You develop a rash.  You have a fever. Get help right away if:  You have trouble breathing. This information is not intended to replace advice given to you by your health care provider. Make sure you discuss any questions you have with your health care provider. Document Revised: 05/28/2017 Document Reviewed: 10/05/2015 Elsevier Patient Education  2020 [employer].  

## 2020-05-27 NOTE — Progress Notes (Signed)
HEMATOLOGY/ONCOLOGY CONSULTATION NOTE  Date of Service: 05/28/2020  Patient Care Team: [name], NP as PCP - General (Adult Health Nurse Practitioner)  CHIEF COMPLAINTS/PURPOSE OF CONSULTATION:  Multiple Myeloma  HISTORY OF PRESENTING ILLNESS:  Lisa Lester is a wonderful 74 y.o. female who has been referred to [place] by Dr. 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 Pt is accompanied today by her nephew. The pt reports that she is doing well overall.   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 To treat this the pt had a Kyphoplasty and radiation therapy. Pt also had a BM Bx, urinalysis, and a bone scan completed at the time. There was no official diagnosis of MGUS or Multiple Myeloma at this time, but they continued to monitor.   A few weeks ago ptt began having significant pain in her chest, which was thought to be a fractured sternum. Pt describes the pain as "flesh being ripped off of her bones". She was also having left-sided neck and back discomfort at this time. Pt was taken to the hospital where her pain was treated and she received a right Iliac Bx, BM Bx, and CT Chest while admitted. They did not complete any whole body imaging at this time.   She had a root canal nearly 1.5 months ago. Pt discontinued taking Thyroid replacement 5-6 years ago. Pt is also no longer taking any HTN medications. She was asked to restart Gabapentin yesterday, which she was given for pain control, but felt ill after taking it. She continues taking Xanax and Hydrocodone every 4 hours. Pt reports some constipation and takes Colace occasionally.   Pt had COVID19 in July of 2020. Her husband also had the virus which caused his passing. Pt has received two doses of the COVID19 vaccine but has not received the booster.  Of note since the patient's last visit, pt has had CT Chest ([id number]) completed on  04/09/2020 with results revealing "1. Chronic posterior surgical fusion of L2 and L4 with bilateral transpedicular screws and bridging rods. Hardware intact appearing. 2. Status post vertebroplasty of L3. There is an underlying lytic lesion at the L3 vertebral body which extends into its left pedicle. These findings correlate with the provided history of a known plasmacytoma. 3. There are numerous lytic skeletal lesions at the chest, abdomen and pelvis, more numerous at the spine and ribs. 4. Multiple lytic lesions are present at the sternum, with interval development of a pathologic fracture at the sternum without significant displacement. 5. Stable mild superior endplate compression deformity at T11. 6. 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 Similar findings were present on prior study from 03/30/12, the degree of biliary ductal dilation has worsened as compared to the prior study. No choledochal stone identified.   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  Most recent lab results (04/11/2020) of CBC w/diff is as follows: all values are WNL except for Lymphs Rel at  18.5. 04/10/2020 CMP is as follows: all values are WNL except for CO2 at 33, Glucose at 120, Calcium at 11.6, Anion gap at 3.  On review of systems, pt reports constipation, chest pain, neck pain, back pain and denies dysuria and any other symptoms.   On PMHx the pt reports  Kyphoplasty, Plasmacytoma, HTN, Thyroid disorder. On Social Hx the pt reports that she is a non-smoker and only drinks alcohol socially.   INTERVAL HISTORY: Lisa DASSOW is a wonderful 74 y.o. 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 The patient's last visit with [place] was on 05/07/2020. The pt reports that she is doing well overall.  The pt reports that she has continues to experience left-sided neck pain, as well as some discomfort over her left shoulder. The pt has received her Revlimid, but has not started this or Dexamethasone. Pt has started taking Aspirin daily and Acyclovir twice per day.   Of note since the patient's last visit, pt has had PET/CT ([id number]) completed on 05/24/2020 with results revealing "1. There are multiple hypermetabolic lytic osseous lesions consistent with multiple myeloma. The most prominent lesion is within the left lateral mass of C1 and this likely involves the foramen transversarium. Probable T3 and sternal pathologic fractures. 2. No evidence of solid visceral organ involvement. 3. Small hypermetabolic subcutaneous nodule anteriorly in the mid right thigh may reflect an incidental soft tissue neoplasm."  Lab results today (05/27/20) of CBC w/diff and CMP is as follows: all values are WNL except for Creatinine at 1.12, Calcium at 10.8, AST at 13, GFR Est at 52. 05/28/2020 K/L light chains is in progress 05/28/2020 MMP is in progress  On review of systems, pt reports neck pain, left shoulder pain and denies abdominal pain, leg swelling and any other symptoms.   MEDICAL HISTORY:  Past Medical History:  Diagnosis Date  . Multiple myeloma ([place])   . Pathologic fracture    L3  . Plasmacytoma ([place])     SURGICAL HISTORY: Past Surgical History:  Procedure Laterality Date  . BACK SURGERY    . BREAST ENHANCEMENT SURGERY Bilateral   . LUMBAR FUSION     L2 - L4, hx of pathologic fx of L3    SOCIAL HISTORY: Social History    Socioeconomic History  . Marital status: Widowed    Spouse name: russell Danner  . Number of children: 0  . Years of education: 65  . Highest education level: 12th grade  Occupational History  . Not on file  Tobacco Use  . Smoking status: Never Smoker  . Smokeless tobacco: Never Used  Vaping Use  . Vaping Use: Never used  Substance and Sexual Activity  . Alcohol use: Never  . Drug use: Never  . Sexual activity: Not Currently  Other Topics Concern  . Not on file  Social History Narrative  . Not on file   Social Determinants of Health   Financial Resource Strain:   . Difficulty of Paying Living Expenses: Not on file  Food Insecurity:   . Worried About Charity fundraiser in the Last Year: Not on file  . Ran Out of Food in the Last Year: Not on file  Transportation Needs:   . Lack of Transportation (Medical): Not on file  . Lack of Transportation (Non-Medical): Not on file  Physical Activity:   . Days of Exercise per Week: Not on file  . Minutes of Exercise per Session: Not on file  Stress:   .  Feeling of Stress : Not on file  Social Connections:   . Frequency of Communication with Friends and Family: Not on file  . Frequency of Social Gatherings with Friends and Family: Not on file  . Attends Religious Services: Not on file  . Active Member of Clubs or Organizations: Not on file  . Attends Archivist Meetings: Not on file  . Marital Status: Not on file  Intimate Partner Violence:   . Fear of Current or Ex-Partner: Not on file  . Emotionally Abused: Not on file  . Physically Abused: Not on file  . Sexually Abused: Not on file    FAMILY HISTORY: Family History  Problem Relation Age of Onset  . Cancer - Other Mother   . Cancer - Other Father     ALLERGIES:  has No Known Allergies.  MEDICATIONS:  Current Outpatient Medications  Medication Sig Dispense Refill  . acyclovir (ZOVIRAX) 400 MG tablet Take 1 tablet (400 mg total) by mouth 2 (two) times  daily. 60 tablet 3  . alprazolam (XANAX) 2 MG tablet Take 2 mg by mouth at bedtime as needed for sleep.    Marland Kitchen aspirin EC 325 MG tablet Take 1 tablet (325 mg total) by mouth daily. 30 tablet 5  . carvedilol (COREG) 6.25 MG tablet Take 6.25 mg by mouth 2 (two) times daily with a meal. (Patient not taking: Reported on 04/23/2020)    . dexamethasone (DECADRON) 4 MG tablet Take 5 tablets (20 mg total) by mouth once a week. 20 tablet 2  . dexamethasone (DECADRON) 4 MG tablet Take 5 tablets (20 mg) on D15 of each cycle. Take with breakfast 40 tablet 5  . DULoxetine (CYMBALTA) 30 MG capsule Take 1 capsule (30 mg total) by mouth daily. 30 capsule 3  . gabapentin (NEURONTIN) 100 MG capsule Take 1 capsule (100 mg total) by mouth 3 (three) times daily. (Patient not taking: Reported on 05/07/2020) 180 capsule 0  . lenalidomide (REVLIMID) 15 MG capsule Take 1 capsule (15 mg total) by mouth daily. Take for 14 days on, 7 days off. Repeat every 21 days. [employer] Auth# [id number]; Date obtained 05/15/20 14 capsule 0  . ondansetron (ZOFRAN) 8 MG tablet Take 1 tablet (8 mg total) by mouth 2 (two) times daily as needed (Nausea or vomiting). 30 tablet 1  . polyethylene glycol (MIRALAX) 17 g packet Take 17 g by mouth daily. 30 each 1  . prochlorperazine (COMPAZINE) 10 MG tablet Take 1 tablet (10 mg total) by mouth every 6 (six) hours as needed (Nausea or vomiting). 30 tablet 1  . senna-docusate (SENNA S) 8.6-50 MG tablet Take 2 tablets by mouth at bedtime. 60 tablet 1   No current facility-administered medications for this visit.    REVIEW OF SYSTEMS:   A 10+ POINT REVIEW OF SYSTEMS WAS OBTAINED including neurology, dermatology, psychiatry, cardiac, respiratory, lymph, extremities, GI, GU, Musculoskeletal, constitutional, breasts, reproductive, HEENT.  All pertinent positives are noted in the HPI.  All others are negative.   PHYSICAL EXAMINATION: ECOG PERFORMANCE STATUS: 2 - Symptomatic, <50% confined to bed  . Vitals:    05/28/20 0920  BP: (!) 146/66  Pulse: 96  Resp: 18  Temp: (!) 97.3 F (36.3 C)  SpO2: 97%   Filed Weights   05/28/20 0920  Weight: 158 lb 14.4 oz (72.1 kg)   .Body mass index is 25.65 kg/m.  Exam was given in a chair   GENERAL:alert, in no acute distress and comfortable SKIN: no acute  rashes, no significant lesions EYES: conjunctiva are pink and non-injected, sclera anicteric OROPHARYNX: MMM, no exudates, no oropharyngeal erythema or ulceration NECK: no JVD LYMPH:  no palpable lymphadenopathy in the cervical, axillary or inguinal regions LUNGS: clear to auscultation b/l with normal respiratory effort HEART: regular rate & rhythm ABDOMEN:  normoactive bowel sounds , non tender, not distended. No palpable hepatosplenomegaly.  Extremity: no pedal edema PSYCH: alert & oriented x 3 with fluent speech NEURO: no focal motor/sensory deficits  LABORATORY DATA:  I have reviewed the data as listed  . CBC Latest Ref Rng & Units 05/28/2020 05/27/2020 05/15/2020  WBC 4.0 - 10.5 K/uL 5.2 4.8 5.6  Hemoglobin 12.0 - 15.0 g/dL 12.3 12.7 12.1  Hematocrit 36 - 46 % 38.4 38.8 36.7  Platelets 150 - 400 K/uL 265 271 329    . CMP Latest Ref Rng & Units 05/28/2020 05/15/2020  Glucose 70 - 99 mg/dL 90 108(H)  BUN 8 - 23 mg/dL 19 15  Creatinine 0.44 - 1.00 mg/dL 1.12(H) 0.98  Sodium 135 - 145 mmol/L 140 138  Potassium 3.5 - 5.1 mmol/L 4.2 4.0  Chloride 98 - 111 mmol/L 105 100  CO2 22 - 32 mmol/L 26 29  Calcium 8.9 - 10.3 mg/dL 10.8(H) 9.6  Total Protein 6.5 - 8.1 g/dL 7.0 7.1  Total Bilirubin 0.3 - 1.2 mg/dL 0.4 0.4  Alkaline Phos 38 - 126 U/L 104 106  AST 15 - 41 U/L 13(L) 10(L)  ALT 0 - 44 U/L 16 8     RADIOGRAPHIC STUDIES: I have personally reviewed the radiological images as listed and agreed with the findings in the report. NM PET Image Initial (PI) Whole Body  Result Date: 05/24/2020 CLINICAL DATA:  Initial treatment strategy for multiple myeloma. EXAM: NUCLEAR MEDICINE  PET WHOLE BODY TECHNIQUE: 7.8 mCi F-18 FDG was injected intravenously. Full-ring PET imaging was performed from the head to foot after the radiotracer. CT data was obtained and used for attenuation correction and anatomic localization. Fasting blood glucose: 110 mg/dl COMPARISON:  None. FINDINGS: Mediastinal blood pool activity: SUV max 2.9 HEAD/ NECK: No hypermetabolic cervical lymph nodes are identified.There are no lesions of the pharyngeal mucosal space. Incidental CT findings: none CHEST: There are no hypermetabolic mediastinal, hilar or axillary lymph nodes. No hypermetabolic pulmonary activity or suspicious pulmonary nodularity. Incidental CT findings: Mild dependent atelectasis or scarring in both lungs. Mild atherosclerosis of the aorta, great vessels and coronary arteries. Bilateral breast implants are noted. ABDOMEN/PELVIS: There is no hypermetabolic activity within the liver, adrenal glands, spleen or pancreas. There is no hypermetabolic nodal activity. Incidental CT findings: Central 1.9 cm cyst in the right hepatic lobe. Mild aortic and branch vessel atherosclerosis. SKELETON: There are multiple hypermetabolic lytic osseous lesions within the axial skeleton. Within the left lateral mass of C1, there is a 2.2 cm lytic lesion involving the foramen transversarium which is significantly hypermetabolic (SUV max 23.7). There is a lytic lesion in the left aspect of the C4 vertebral body with an SUV max of 6.4. Multiple small lesions are present within the thoracolumbar spine. There are multiple hypermetabolic rib and sternal lesions, including a lower sternal lesion with an SUV max of 6.7 which is associated with an anteriorly displaced fracture, and a lesion involving the left 5th rib laterally with an SUV max of 9.4. Relatively limited involvement of the bony pelvis. Incidental CT findings: Status post L3 corpectomy and L2-4 posterior fusion. Probable T3 pathologic fracture. EXTREMITIES: There are multiple  small hypermetabolic lesions  within the scapula and clavicle bilaterally. In the distal right humeral shaft, there is a hypermetabolic lesion within SUV max of 3.9. There is a possible lesion in the anterior intertrochanteric region of the left femur versus adjacent soft tissue activity. No other significant osseous lesions in the lower extremities. There is a small hypermetabolic soft tissue mass anteriorly in the subcutaneous fat of the mid right thigh. This measures 1.3 x 1.0 cm on image 239/4 and has an SUV max of 3.4. Incidental CT findings: none IMPRESSION: 1. There are multiple hypermetabolic lytic osseous lesions consistent with multiple myeloma. The most prominent lesion is within the left lateral mass of C1 and this likely involves the foramen transversarium. Probable T3 and sternal pathologic fractures. 2. No evidence of solid visceral organ involvement. 3. Small hypermetabolic subcutaneous nodule anteriorly in the mid right thigh may reflect an incidental soft tissue neoplasm. Electronically Signed   By: Richardean Sale M.D.   On: 05/24/2020 09:22   CT Biopsy  Result Date: 05/27/2020 INDICATION: Multiple myeloma please perform CT-guided bone marrow biopsy for tissue diagnostic purposes. EXAM: CT-GUIDED BONE MARROW BIOPSY AND ASPIRATION MEDICATIONS: None ANESTHESIA/SEDATION: Fentanyl 100 mcg IV; Versed 2 mg IV Sedation Time: 10 Minutes; The patient was continuously monitored during the procedure by the interventional radiology nurse under my direct supervision. COMPLICATIONS: None immediate. PROCEDURE: Informed consent was obtained from the patient following an explanation of the procedure, risks, benefits and alternatives. The patient understands, agrees and consents for the procedure. All questions were addressed. A time out was performed prior to the initiation of the procedure. The patient was positioned prone and non-contrast localization CT was performed of the pelvis to demonstrate the iliac  marrow spaces. The operative site was prepped and draped in the usual sterile fashion. Under sterile conditions and local anesthesia, a 22 gauge spinal needle was utilized for procedural planning. Next, an 11 gauge coaxial bone biopsy needle was advanced into the left iliac marrow space. Needle position was confirmed with CT imaging. Initially, a bone marrow aspiration was performed. Next, a bone marrow biopsy was obtained with the 11 gauge outer bone marrow device. Samples were prepared with the cytotechnologist and deemed adequate. The needle was removed and superficial hemostasis was obtained with manual compression. A dressing was applied. The patient tolerated the procedure well without immediate post procedural complication. IMPRESSION: Successful CT guided left iliac bone marrow aspiration and core biopsy. Electronically Signed   By: Sandi Mariscal M.D.   On: 05/27/2020 11:31   CT BONE MARROW BIOPSY & ASPIRATION  Result Date: 05/27/2020 INDICATION: Multiple myeloma please perform CT-guided bone marrow biopsy for tissue diagnostic purposes. EXAM: CT-GUIDED BONE MARROW BIOPSY AND ASPIRATION MEDICATIONS: None ANESTHESIA/SEDATION: Fentanyl 100 mcg IV; Versed 2 mg IV Sedation Time: 10 Minutes; The patient was continuously monitored during the procedure by the interventional radiology nurse under my direct supervision. COMPLICATIONS: None immediate. PROCEDURE: Informed consent was obtained from the patient following an explanation of the procedure, risks, benefits and alternatives. The patient understands, agrees and consents for the procedure. All questions were addressed. A time out was performed prior to the initiation of the procedure. The patient was positioned prone and non-contrast localization CT was performed of the pelvis to demonstrate the iliac marrow spaces. The operative site was prepped and draped in the usual sterile fashion. Under sterile conditions and local anesthesia, a 22 gauge spinal needle  was utilized for procedural planning. Next, an 11 gauge coaxial bone biopsy needle was advanced into the left  iliac marrow space. Needle position was confirmed with CT imaging. Initially, a bone marrow aspiration was performed. Next, a bone marrow biopsy was obtained with the 11 gauge outer bone marrow device. Samples were prepared with the cytotechnologist and deemed adequate. The needle was removed and superficial hemostasis was obtained with manual compression. A dressing was applied. The patient tolerated the procedure well without immediate post procedural complication. IMPRESSION: Successful CT guided left iliac bone marrow aspiration and core biopsy. Electronically Signed   By: Sandi Mariscal M.D.   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  Previous h/o plasmacytomas PLAN: -Discussed pt labwork today, 05/27/20; no anemia, hypercalcemia, kidney numbers are up slightly, K/L light chains & MMP are in progress. -Discussed 05/15/2020 MMP shows M Protein at 0.7 g/dL, K/L light chain ratio at 105.59, Beta-2 Microglobulin at 3.1, Sed Rate at 27. -Discussed 05/24/2020 PET/CT ([id number]) which revealed "1. There are multiple hypermetabolic lytic osseous lesions consistent with multiple myeloma. The most prominent lesion is within the left lateral mass of C1 and this likely involves the foramen transversarium. Probable T3 and sternal pathologic fractures." -Currently awaiting results of 05/27/20 BM Bx. -Advised pt again that myeloma is treatable, but is not curable. -Advised pt that genetic studies are important for risk stratification in Multiple Myelomas. -Advised pt that she will need an urgent referral to RadOnc for C1 lesion as it is concerning.  -The pt has no prohibitive toxicities from beginning VRD at this time. -Advised pt to begin Revlimid two weeks on/one week off and  Dexamethasone on D1,8,15. -Advised pt that use of bisphosphonates can increase the risk of osteonecrosis of the jaw in patients with significant dental issues.  -The pt denies any current significant dental issues.   -Recommend soft collar neck brace for neck pain. -Will begin Zometa today, continue qweeks -Will increase Fentanyl Patch to 25 mcg/hr -Advised pt to not exceed 8 Norco per day. -Will increase Hydrocodone content in Norco - cancel Oxycodone -Continue daily baby ASA and Acyclovir BID -Will see back in 10 days for a toxicity check  -Urgent referral to RadOnc   FOLLOW UP: -Urgent referral to radiation oncology for consideration of palliative radiation for painful cervical spine and other bone lesions for patient with newly diagnosed multiple myeloma. -Please schedule remaining cycle 1 of VRD and also all of cycle 2 of VRD as per orders. -Labs on day 1 and day 8 of each cycle. -MD visit on cycle 1 day 11 for toxicity check -MD visit on cycle 2-day 1. -Zometa starting today and every 4 weeks please schedule next 4 cycles.   The total time spent in the appt was 30 minutes and more than 50% was on counseling and direct patient cares.  All of the patient's questions were answered with apparent satisfaction. The patient knows to call the clinic with any problems, questions or concerns.    Sullivan Lone MD [place] [facility]AHIVMS [facility] Hematology/Oncology Physician [facility]  (Office):       [phone] (Work cell):  [phone] (Fax):           [phone]  05/28/2020 10:19 AM  I, Yevette Edwards, am acting as a scribe for Dr. Sullivan Lone.   .I have reviewed the above documentation for accuracy and completeness, and I agree with the above. Brunetta Genera MD

## 2020-05-27 NOTE — Consult Note (Signed)
Chief Complaint: Patient was seen in consultation today for CT guided bone marrow biopsy  Referring Physician(s): Brunetta Genera  Supervising Physician: Sandi Mariscal  Patient Status: [facility] - Out-pt  History of Present Illness: Lisa Lester is a 74 y.o. female with history of recently diagnosed multiple myeloma who presents today for CT-guided bone marrow biopsy for further evaluation.   Past Medical History:  Diagnosis Date  . Multiple myeloma ([place])   . Pathologic fracture    L3  . 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 BACK SURGERY    . BREAST ENHANCEMENT SURGERY Bilateral   . LUMBAR FUSION     L2 - L4, hx of pathologic fx of L3    Allergies: Patient has no known allergies.  Medications: Prior to Admission medications   Medication Sig Start Date End Date Taking? Authorizing Provider  alprazolam Duanne Moron) 2 MG tablet Take 2 mg by mouth at bedtime as needed for sleep.   Yes [provider]  aspirin EC 325 MG tablet Take 1 tablet (325 mg total) by mouth daily. 05/13/20  Yes Brunetta Genera, MD  DULoxetine (CYMBALTA) 30 MG capsule Take 1 capsule (30 mg total) by mouth daily. 05/07/20  Yes Brunetta Genera, MD  HYDROcodone-acetaminophen (NORCO/VICODIN) 5-325 MG tablet Take 1-2 tablets by mouth every 6 (six) hours as needed for moderate pain.   Yes [provider]  ondansetron (ZOFRAN) 8 MG tablet Take 1 tablet (8 mg total) by mouth 2 (two) times daily as needed (Nausea or vomiting). 05/13/20  Yes Brunetta Genera, MD  oxyCODONE (OXY IR/ROXICODONE) 5 MG immediate release tablet Take 1-2 tablets (5-10 mg total) by mouth every 4 (four) hours as needed for severe pain. 05/21/20  Yes Brunetta Genera, MD  polyethylene glycol (MIRALAX) 17 g packet Take 17 g by mouth daily. 05/07/20  Yes Brunetta Genera, MD  prochlorperazine (COMPAZINE) 10 MG tablet Take 1 tablet (10 mg total) by mouth every 6 (six)  hours as needed (Nausea or vomiting). 05/13/20  Yes Brunetta Genera, MD  senna-docusate (SENNA S) 8.6-50 MG tablet Take 2 tablets by mouth at bedtime. 05/07/20  Yes Brunetta Genera, MD  acyclovir (ZOVIRAX) 400 MG tablet Take 1 tablet (400 mg total) by mouth 2 (two) times daily. 05/13/20   Brunetta Genera, MD  carvedilol (COREG) 6.25 MG tablet Take 6.25 mg by mouth 2 (two) times daily with a meal. Patient not taking: Reported on 04/23/2020    [provider]  dexamethasone (DECADRON) 4 MG tablet Take 5 tablets (20 mg total) by mouth once a week. 05/07/20   Brunetta Genera, MD  dexamethasone (DECADRON) 4 MG tablet Take 5 tablets (20 mg) on D15 of each cycle. Take with breakfast 05/13/20   Brunetta Genera, MD  fentaNYL (DURAGESIC) 12 MCG/HR Place 1 patch onto the skin every 3 (three) days. 05/16/20   Brunetta Genera, MD  gabapentin (NEURONTIN) 100 MG capsule Take 1 capsule (100 mg total) by mouth 3 (three) times daily. Patient not taking: Reported on 05/07/2020 05/01/20   [name], NP  lenalidomide (REVLIMID) 15 MG capsule Take 1 capsule (15 mg total) by mouth daily. Take for 14 days on, 7 days off. Repeat every 21 days. [employer] Auth# [id number]; Date obtained 05/15/20 05/15/20   Brunetta Genera, MD     Family History  Problem Relation Age of Onset  . Cancer - Other Mother   .  Cancer - Other Father     Social History   Socioeconomic History  . Marital status: Widowed    Spouse name: russell Jernigan  . Number of children: 0  . Years of education: 55  . Highest education level: 12th grade  Occupational History  . Not on file  Tobacco Use  . Smoking status: Never Smoker  . Smokeless tobacco: Never Used  Vaping Use  . Vaping Use: Never used  Substance and Sexual Activity  . Alcohol use: Never  . Drug use: Never  . Sexual activity: Not Currently  Other Topics Concern  . Not on file  Social History Narrative  . Not on file   Social  Determinants of Health   Financial Resource Strain:   . Difficulty of Paying Living Expenses: Not on file  Food Insecurity:   . Worried About Charity fundraiser in the Last Year: Not on file  . Ran Out of Food in the Last Year: Not on file  Transportation Needs:   . Lack of Transportation (Medical): Not on file  . Lack of Transportation (Non-Medical): Not on file  Physical Activity:   . Days of Exercise per Week: Not on file  . Minutes of Exercise per Session: Not on file  Stress:   . Feeling of Stress : Not on file  Social Connections:   . Frequency of Communication with Friends and Family: Not on file  . Frequency of Social Gatherings with Friends and Family: Not on file  . Attends Religious Services: Not on file  . Active Member of Clubs or Organizations: Not on file  . Attends Archivist Meetings: Not on file  . Marital Status: Not on file      Review of Systems currently denies fever, headache, chest pain, dyspnea, cough, abdominal pain, nausea, vomiting or bleeding.  She does have scapular and neck pain  Vital Signs: BP 134/65   Pulse 89   Temp 98.9 F (37.2 C) (Oral)   Resp 16   SpO2 96%   Physical Exam awake, alert.  Chest with slightly diminished breath sounds bases.  Heart with regular rate and rhythm.  Abdomen soft, positive bowel sounds, nontender.  No significant lower extremity edema.  Imaging: NM PET Image Initial (PI) Whole Body  Result Date: 05/24/2020 CLINICAL DATA:  Initial treatment strategy for multiple myeloma. EXAM: NUCLEAR MEDICINE PET WHOLE BODY TECHNIQUE: 7.8 mCi F-18 FDG was injected intravenously. Full-ring PET imaging was performed from the head to foot after the radiotracer. CT data was obtained and used for attenuation correction and anatomic localization. Fasting blood glucose: 110 mg/dl COMPARISON:  None. FINDINGS: Mediastinal blood pool activity: SUV max 2.9 HEAD/ NECK: No hypermetabolic cervical lymph nodes are identified.There  are no lesions of the pharyngeal mucosal space. Incidental CT findings: none CHEST: There are no hypermetabolic mediastinal, hilar or axillary lymph nodes. No hypermetabolic pulmonary activity or suspicious pulmonary nodularity. Incidental CT findings: Mild dependent atelectasis or scarring in both lungs. Mild atherosclerosis of the aorta, great vessels and coronary arteries. Bilateral breast implants are noted. ABDOMEN/PELVIS: There is no hypermetabolic activity within the liver, adrenal glands, spleen or pancreas. There is no hypermetabolic nodal activity. Incidental CT findings: Central 1.9 cm cyst in the right hepatic lobe. Mild aortic and branch vessel atherosclerosis. SKELETON: There are multiple hypermetabolic lytic osseous lesions within the axial skeleton. Within the left lateral mass of C1, there is a 2.2 cm lytic lesion involving the foramen transversarium which is significantly hypermetabolic (SUV  max 15.6). There is a lytic lesion in the left aspect of the C4 vertebral body with an SUV max of 6.4. Multiple small lesions are present within the thoracolumbar spine. There are multiple hypermetabolic rib and sternal lesions, including a lower sternal lesion with an SUV max of 6.7 which is associated with an anteriorly displaced fracture, and a lesion involving the left 5th rib laterally with an SUV max of 9.4. Relatively limited involvement of the bony pelvis. Incidental CT findings: Status post L3 corpectomy and L2-4 posterior fusion. Probable T3 pathologic fracture. EXTREMITIES: There are multiple small hypermetabolic lesions within the scapula and clavicle bilaterally. In the distal right humeral shaft, there is a hypermetabolic lesion within SUV max of 3.9. There is a possible lesion in the anterior intertrochanteric region of the left femur versus adjacent soft tissue activity. No other significant osseous lesions in the lower extremities. There is a small hypermetabolic soft tissue mass anteriorly in  the subcutaneous fat of the mid right thigh. This measures 1.3 x 1.0 cm on image 239/4 and has an SUV max of 3.4. Incidental CT findings: none IMPRESSION: 1. There are multiple hypermetabolic lytic osseous lesions consistent with multiple myeloma. The most prominent lesion is within the left lateral mass of C1 and this likely involves the foramen transversarium. Probable T3 and sternal pathologic fractures. 2. No evidence of solid visceral organ involvement. 3. Small hypermetabolic subcutaneous nodule anteriorly in the mid right thigh may reflect an incidental soft tissue neoplasm. Electronically Signed   By: Richardean Sale M.D.   On: 05/24/2020 09:22    Labs:  CBC: Recent Labs    05/15/20 1122 05/27/20 0730  WBC 5.6 4.8  HGB 12.1 12.7  HCT 36.7 38.8  PLT 329 271    COAGS: Recent Labs    05/27/20 0730  INR 0.9    BMP: Recent Labs    05/15/20 1122  NA 138  K 4.0  CL 100  CO2 29  GLUCOSE 108*  BUN 15  CALCIUM 9.6  CREATININE 0.98  GFRNONAA >60    LIVER FUNCTION TESTS: Recent Labs    05/15/20 1122  BILITOT 0.4  AST 10*  ALT 8  ALKPHOS 106  PROT 7.1  ALBUMIN 3.7    TUMOR MARKERS: No results for input(s): AFPTM, CEA, CA199, CHROMGRNA in the last 8760 hours.  Assessment and Plan: 74 y.o. female with history of recently diagnosed multiple myeloma who presents today for CT-guided bone marrow biopsy for further evaluation.Risks and benefits of procedure was discussed with the patient  including, but not limited to bleeding, infection, damage to adjacent structures or low yield requiring additional tests.  All of the questions were answered and there is agreement to proceed.  Consent signed and in chart.     Thank you for this interesting consult.  I greatly enjoyed meeting Lisa Lester and look forward to participating in their care.  A copy of this report was sent to the requesting provider on this date.  Electronically Signed: D. Rowe Robert,  PA-C 05/27/2020, 8:25 AM   I spent a total of 20 minutes   in face to face in clinical consultation, greater than 50% of which was counseling/coordinating care for CT guided bone marrow biopsy

## 2020-05-27 NOTE — Procedures (Signed)
Pre-procedure Diagnosis: Multiple myeloma Post-procedure Diagnosis: Same  Technically successful CT guided bone marrow aspiration and biopsy of left iliac crest.   Complications: None Immediate  EBL: None  Signed: Sandi Mariscal Pager: [phone] 05/27/2020, 9:50 AM

## 2020-05-28 ENCOUNTER — Other Ambulatory Visit: Payer: Self-pay

## 2020-05-28 ENCOUNTER — Inpatient Hospital Stay: Payer: Medicare Other

## 2020-05-28 ENCOUNTER — Inpatient Hospital Stay (HOSPITAL_BASED_OUTPATIENT_CLINIC_OR_DEPARTMENT_OTHER): Payer: Medicare Other | Admitting: Hematology

## 2020-05-28 VITALS — BP 146/66 | HR 96 | Temp 97.3°F | Resp 18 | Ht 66.0 in | Wt 158.9 lb

## 2020-05-28 DIAGNOSIS — C9000 Multiple myeloma not having achieved remission: Secondary | ICD-10-CM

## 2020-05-28 DIAGNOSIS — Z7189 Other specified counseling: Secondary | ICD-10-CM

## 2020-05-28 LAB — CMP (CANCER CENTER ONLY)
ALT: 16 U/L (ref 0–44)
AST: 13 U/L — ABNORMAL LOW (ref 15–41)
Albumin: 3.8 g/dL (ref 3.5–5.0)
Alkaline Phosphatase: 104 U/L (ref 38–126)
Anion gap: 9 (ref 5–15)
BUN: 19 mg/dL (ref 8–23)
CO2: 26 mmol/L (ref 22–32)
Calcium: 10.8 mg/dL — ABNORMAL HIGH (ref 8.9–10.3)
Chloride: 105 mmol/L (ref 98–111)
Creatinine: 1.12 mg/dL — ABNORMAL HIGH (ref 0.44–1.00)
GFR, Estimated: 52 mL/min — ABNORMAL LOW (ref >60)
Glucose, Bld: 90 mg/dL (ref 70–99)
Potassium: 4.2 mmol/L (ref 3.5–5.1)
Sodium: 140 mmol/L (ref 135–145)
Total Bilirubin: 0.4 mg/dL (ref 0.3–1.2)
Total Protein: 7 g/dL (ref 6.5–8.1)

## 2020-05-28 LAB — CBC WITH DIFFERENTIAL/PLATELET
Abs Immature Granulocytes: 0.02 10*3/uL (ref 0.00–0.07)
Basophils Absolute: 0.1 10*3/uL (ref 0.0–0.1)
Basophils Relative: 1 %
Eosinophils Absolute: 0.1 10*3/uL (ref 0.0–0.5)
Eosinophils Relative: 2 %
HCT: 38.4 % (ref 36.0–46.0)
Hemoglobin: 12.3 g/dL (ref 12.0–15.0)
Immature Granulocytes: 0 %
Lymphocytes Relative: 33 %
Lymphs Abs: 1.7 10*3/uL (ref 0.7–4.0)
MCH: 30.1 pg (ref 26.0–34.0)
MCHC: 32 g/dL (ref 30.0–36.0)
MCV: 93.9 fL (ref 80.0–100.0)
Monocytes Absolute: 0.5 10*3/uL (ref 0.1–1.0)
Monocytes Relative: 9 %
Neutro Abs: 2.8 10*3/uL (ref 1.7–7.7)
Neutrophils Relative %: 55 %
Platelets: 265 10*3/uL (ref 150–400)
RBC: 4.09 MIL/uL (ref 3.87–5.11)
RDW: 13.2 % (ref 11.5–15.5)
WBC: 5.2 10*3/uL (ref 4.0–10.5)
nRBC: 0 % (ref 0.0–0.2)

## 2020-05-28 MED ORDER — DEXAMETHASONE 4 MG PO TABS
ORAL_TABLET | ORAL | Status: AC
  Filled 2020-05-28: qty 5

## 2020-05-28 MED ORDER — ZOLEDRONIC ACID 4 MG/100ML IV SOLN
4.0000 mg | Freq: Once | INTRAVENOUS | Status: AC
  Administered 2020-05-28: 4 mg via INTRAVENOUS

## 2020-05-28 MED ORDER — ZOLEDRONIC ACID 4 MG/100ML IV SOLN
INTRAVENOUS | Status: AC
  Filled 2020-05-28: qty 100

## 2020-05-28 MED ORDER — BORTEZOMIB CHEMO SQ INJECTION 3.5 MG (2.5MG/ML)
1.3000 mg/m2 | Freq: Once | INTRAMUSCULAR | Status: AC
  Administered 2020-05-28: 2.5 mg via SUBCUTANEOUS
  Filled 2020-05-28: qty 1

## 2020-05-28 MED ORDER — DEXAMETHASONE 4 MG PO TABS
20.0000 mg | ORAL_TABLET | Freq: Once | ORAL | Status: AC
  Administered 2020-05-28: 20 mg via ORAL

## 2020-05-28 MED ORDER — HYDROCODONE-ACETAMINOPHEN 10-325 MG PO TABS
1.0000 | ORAL_TABLET | Freq: Four times a day (QID) | ORAL | 0 refills | Status: DC | PRN
Start: 2020-05-28 — End: 2020-06-18

## 2020-05-28 MED ORDER — SODIUM CHLORIDE 0.9 % IV SOLN
Freq: Once | INTRAVENOUS | Status: AC
  Filled 2020-05-28: qty 250

## 2020-05-28 MED ORDER — FENTANYL 25 MCG/HR TD PT72
1.0000 | MEDICATED_PATCH | TRANSDERMAL | 0 refills | Status: DC
Start: 2020-05-28 — End: 2020-11-08

## 2020-05-28 NOTE — Patient Instructions (Addendum)
[place] Discharge Instructions for Patients Receiving Chemotherapy  Today you received the following chemotherapy agents velcade/zometa   To help prevent nausea and vomiting after your treatment, we encourage you to take your nausea medication as directed   If you develop nausea and vomiting that is not controlled by your nausea medication, call the clinic.   BELOW ARE SYMPTOMS THAT SHOULD BE REPORTED IMMEDIATELY:  *FEVER GREATER THAN 100.5 F  *CHILLS WITH OR WITHOUT FEVER  NAUSEA AND VOMITING THAT IS NOT CONTROLLED WITH YOUR NAUSEA MEDICATION  *UNUSUAL SHORTNESS OF BREATH  *UNUSUAL BRUISING OR BLEEDING  TENDERNESS IN MOUTH AND THROAT WITH OR WITHOUT PRESENCE OF ULCERS  *URINARY PROBLEMS  *BOWEL PROBLEMS  UNUSUAL RASH Items with * indicate a potential emergency and should be followed up as soon as possible.  Feel free to call the clinic you have any questions or concerns. The clinic phone number is [phone].  Bortezomib injection What is this medicine? BORTEZOMIB (bor TEZ oh mib) is a medicine that targets proteins in cancer cells and stops the cancer cells from growing. It is used to treat multiple myeloma and mantle-cell lymphoma. This medicine may be used for other purposes; ask your health care provider or pharmacist if you have questions. COMMON BRAND NAME(S): Velcade What should I tell my health care provider before I take this medicine? They need to know if you have any of these conditions:  diabetes  heart disease  irregular heartbeat  liver disease  on hemodialysis  low blood counts, like low white blood cells, platelets, or hemoglobin  peripheral neuropathy  taking medicine for blood pressure  an unusual or allergic reaction to bortezomib, mannitol, boron, other medicines, foods, dyes, or preservatives  pregnant or trying to get pregnant  breast-feeding How should I use this medicine? This medicine is for injection into a  vein or for injection under the skin. It is given by a health care professional in a hospital or clinic setting. Talk to your pediatrician regarding the use of this medicine in children. Special care may be needed. Overdosage: If you think you have taken too much of this medicine contact a poison control center or emergency room at once. NOTE: This medicine is only for you. Do not share this medicine with others. What if I miss a dose? It is important not to miss your dose. Call your doctor or health care professional if you are unable to keep an appointment. What may interact with this medicine? This medicine may interact with the following medications:  ketoconazole  rifampin  ritonavir  St. John's Wort This list may not describe all possible interactions. Give your health care provider a list of all the medicines, herbs, non-prescription drugs, or dietary supplements you use. Also tell them if you smoke, drink alcohol, or use illegal drugs. Some items may interact with your medicine. What should I watch for while using this medicine? You may get drowsy or dizzy. Do not drive, use machinery, or do anything that needs mental alertness until you know how this medicine affects you. Do not stand or sit up quickly, especially if you are an older patient. This reduces the risk of dizzy or fainting spells. In some cases, you may be given additional medicines to help with side effects. Follow all directions for their use. Call your doctor or health care professional for advice if you get a fever, chills or sore throat, or other symptoms of a cold or flu. Do not treat yourself.  This drug decreases your body's ability to fight infections. Try to avoid being around people who are sick. This medicine may increase your risk to bruise or bleed. Call your doctor or health care professional if you notice any unusual bleeding. You may need blood work done while you are taking this medicine. In some patients,  this medicine may cause a serious brain infection that may cause death. If you have any problems seeing, thinking, speaking, walking, or standing, tell your doctor right away. If you cannot reach your doctor, urgently seek other source of medical care. Check with your doctor or health care professional if you get an attack of severe diarrhea, nausea and vomiting, or if you sweat a lot. The loss of too much body fluid can make it dangerous for you to take this medicine. Do not become pregnant while taking this medicine or for at least 7 months after stopping it. Women should inform their doctor if they wish to become pregnant or think they might be pregnant. Men should not father a child while taking this medicine and for at least 4 months after stopping it. There is a potential for serious side effects to an unborn child. Talk to your health care professional or pharmacist for more information. Do not breast-feed an infant while taking this medicine or for 2 months after stopping it. This medicine may interfere with the ability to have a child. You should talk with your doctor or health care professional if you are concerned about your fertility. What side effects may I notice from receiving this medicine? Side effects that you should report to your doctor or health care professional as soon as possible:  allergic reactions like skin rash, itching or hives, swelling of the face, lips, or tongue  breathing problems  changes in hearing  changes in vision  fast, irregular heartbeat  feeling faint or lightheaded, falls  pain, tingling, numbness in the hands or feet  right upper belly pain  seizures  swelling of the ankles, feet, hands  unusual bleeding or bruising  unusually weak or tired  vomiting  yellowing of the eyes or skin Side effects that usually do not require medical attention (report to your doctor or health care professional if they continue or are bothersome):  changes in  emotions or moods  constipation  diarrhea  loss of appetite  headache  irritation at site where injected  nausea This list may not describe all possible side effects. Call your doctor for medical advice about side effects. You may report side effects to FDA at 1-800-FDA-1088. Where should I keep my medicine? This drug is given in a hospital or clinic and will not be stored at home. NOTE: This sheet is a summary. It may not cover all possible information. If you have questions about this medicine, talk to your doctor, pharmacist, or health care provider.  2020 Elsevier/Gold Standard (2017-10-25 16:29:31)  Zoledronic Acid injection (Hypercalcemia, Oncology) What is this medicine? ZOLEDRONIC ACID (ZOE le dron ik AS id) lowers the amount of calcium loss from bone. It is used to treat too much calcium in your blood from cancer. It is also used to prevent complications of cancer that has spread to the bone. This medicine may be used for other purposes; ask your health care provider or pharmacist if you have questions. COMMON BRAND NAME(S): Zometa What should I tell my health care provider before I take this medicine? They need to know if you have any of these conditions:  aspirin-sensitive asthma  cancer, especially if you are receiving medicines used to treat cancer  dental disease or wear dentures  infection  kidney disease  receiving corticosteroids like dexamethasone or prednisone  an unusual or allergic reaction to zoledronic acid, other medicines, foods, dyes, or preservatives  pregnant or trying to get pregnant  breast-feeding How should I use this medicine? This medicine is for infusion into a vein. It is given by a health care professional in a hospital or clinic setting. Talk to your pediatrician regarding the use of this medicine in children. Special care may be needed. Overdosage: If you think you have taken too much of this medicine contact a poison control  center or emergency room at once. NOTE: This medicine is only for you. Do not share this medicine with others. What if I miss a dose? It is important not to miss your dose. Call your doctor or health care professional if you are unable to keep an appointment. What may interact with this medicine?  certain antibiotics given by injection  NSAIDs, medicines for pain and inflammation, like ibuprofen or naproxen  some diuretics like bumetanide, furosemide  teriparatide  thalidomide This list may not describe all possible interactions. Give your health care provider a list of all the medicines, herbs, non-prescription drugs, or dietary supplements you use. Also tell them if you smoke, drink alcohol, or use illegal drugs. Some items may interact with your medicine. What should I watch for while using this medicine? Visit your doctor or health care professional for regular checkups. It may be some time before you see the benefit from this medicine. Do not stop taking your medicine unless your doctor tells you to. Your doctor may order blood tests or other tests to see how you are doing. Women should inform their doctor if they wish to become pregnant or think they might be pregnant. There is a potential for serious side effects to an unborn child. Talk to your health care professional or pharmacist for more information. You should make sure that you get enough calcium and vitamin D while you are taking this medicine. Discuss the foods you eat and the vitamins you take with your health care professional. Some people who take this medicine have severe bone, joint, and/or muscle pain. This medicine may also increase your risk for jaw problems or a broken thigh bone. Tell your doctor right away if you have severe pain in your jaw, bones, joints, or muscles. Tell your doctor if you have any pain that does not go away or that gets worse. Tell your dentist and dental surgeon that you are taking this medicine.  You should not have major dental surgery while on this medicine. See your dentist to have a dental exam and fix any dental problems before starting this medicine. Take good care of your teeth while on this medicine. Make sure you see your dentist for regular follow-up appointments. What side effects may I notice from receiving this medicine? Side effects that you should report to your doctor or health care professional as soon as possible:  allergic reactions like skin rash, itching or hives, swelling of the face, lips, or tongue  anxiety, confusion, or depression  breathing problems  changes in vision  eye pain  feeling faint or lightheaded, falls  jaw pain, especially after dental work  mouth sores  muscle cramps, stiffness, or weakness  redness, blistering, peeling or loosening of the skin, including inside the mouth  trouble passing urine or change  in the amount of urine Side effects that usually do not require medical attention (report to your doctor or health care professional if they continue or are bothersome):  bone, joint, or muscle pain  constipation  diarrhea  fever  hair loss  irritation at site where injected  loss of appetite  nausea, vomiting  stomach upset  trouble sleeping  trouble swallowing  weak or tired This list may not describe all possible side effects. Call your doctor for medical advice about side effects. You may report side effects to FDA at 1-800-FDA-1088. Where should I keep my medicine? This drug is given in a hospital or clinic and will not be stored at home. NOTE: This sheet is a summary. It may not cover all possible information. If you have questions about this medicine, talk to your doctor, pharmacist, or health care provider.  2020 Elsevier/Gold Standard (2013-11-11 14:19:39)

## 2020-05-28 NOTE — Progress Notes (Signed)
Pt stable at discharge. Ambulatory to lobby  

## 2020-05-29 LAB — KAPPA/LAMBDA LIGHT CHAINS
Kappa free light chain: 720.8 mg/L — ABNORMAL HIGH (ref 3.3–19.4)
Kappa, lambda light chain ratio: 97.41 — ABNORMAL HIGH (ref 0.26–1.65)
Lambda free light chains: 7.4 mg/L (ref 5.7–26.3)

## 2020-05-29 LAB — SURGICAL PATHOLOGY

## 2020-05-30 ENCOUNTER — Other Ambulatory Visit: Payer: Self-pay | Admitting: Hematology

## 2020-05-30 LAB — MULTIPLE MYELOMA PANEL, SERUM
Albumin SerPl Elph-Mcnc: 3.7 g/dL (ref 2.9–4.4)
Albumin/Glob SerPl: 1.4 (ref 0.7–1.7)
Alpha 1: 0.2 g/dL (ref 0.0–0.4)
Alpha2 Glob SerPl Elph-Mcnc: 0.8 g/dL (ref 0.4–1.0)
B-Globulin SerPl Elph-Mcnc: 0.9 g/dL (ref 0.7–1.3)
Gamma Glob SerPl Elph-Mcnc: 0.9 g/dL (ref 0.4–1.8)
Globulin, Total: 2.7 g/dL (ref 2.2–3.9)
IgA: 35 mg/dL — ABNORMAL LOW (ref 64–422)
IgG (Immunoglobin G), Serum: 987 mg/dL (ref 586–1602)
IgM (Immunoglobulin M), Srm: 31 mg/dL (ref 26–217)
M Protein SerPl Elph-Mcnc: 0.6 g/dL — ABNORMAL HIGH
Total Protein ELP: 6.4 g/dL (ref 6.0–8.5)

## 2020-05-30 NOTE — Telephone Encounter (Signed)
Please review request for 90 day prescription

## 2020-05-31 ENCOUNTER — Inpatient Hospital Stay: Payer: Medicare Other | Attending: Hematology

## 2020-05-31 ENCOUNTER — Other Ambulatory Visit: Payer: Self-pay

## 2020-05-31 VITALS — BP 115/59 | HR 82 | Temp 98.5°F | Resp 16 | Wt 161.0 lb

## 2020-05-31 DIAGNOSIS — Z5111 Encounter for antineoplastic chemotherapy: Secondary | ICD-10-CM | POA: Diagnosis not present

## 2020-05-31 DIAGNOSIS — C9000 Multiple myeloma not having achieved remission: Secondary | ICD-10-CM | POA: Diagnosis present

## 2020-05-31 DIAGNOSIS — Z7189 Other specified counseling: Secondary | ICD-10-CM

## 2020-05-31 MED ORDER — DEXAMETHASONE 4 MG PO TABS
20.0000 mg | ORAL_TABLET | Freq: Once | ORAL | Status: AC
  Administered 2020-05-31: 20 mg via ORAL

## 2020-05-31 MED ORDER — DEXAMETHASONE 4 MG PO TABS
ORAL_TABLET | ORAL | Status: AC
  Filled 2020-05-31: qty 5

## 2020-05-31 MED ORDER — BORTEZOMIB CHEMO SQ INJECTION 3.5 MG (2.5MG/ML)
1.3000 mg/m2 | Freq: Once | INTRAMUSCULAR | Status: AC
  Administered 2020-05-31: 2.5 mg via SUBCUTANEOUS
  Filled 2020-05-31: qty 1

## 2020-05-31 NOTE — Patient Instructions (Signed)
[facility] Discharge Instructions for Patients Receiving Chemotherapy  Today you received the following chemotherapy agent: Bortezomib (Velcade)  To help prevent nausea and vomiting after your treatment, we encourage you to take your nausea medication as directed by your MD.   If you develop nausea and vomiting that is not controlled by your nausea medication, call the clinic.   BELOW ARE SYMPTOMS THAT SHOULD BE REPORTED IMMEDIATELY:  *FEVER GREATER THAN 100.5 F  *CHILLS WITH OR WITHOUT FEVER  NAUSEA AND VOMITING THAT IS NOT CONTROLLED WITH YOUR NAUSEA MEDICATION  *UNUSUAL SHORTNESS OF BREATH  *UNUSUAL BRUISING OR BLEEDING  TENDERNESS IN MOUTH AND THROAT WITH OR WITHOUT PRESENCE OF ULCERS  *URINARY PROBLEMS  *BOWEL PROBLEMS  UNUSUAL RASH Items with * indicate a potential emergency and should be followed up as soon as possible.  Feel free to call the clinic should you have any questions or concerns. The clinic phone number is [phone].  Please show the CHEMO ALERT CARD at check-in to the Emergency Department and triage nurse.   

## 2020-05-31 NOTE — Progress Notes (Signed)
Pt. stable for discharge. Left via ambulation, no respiratory distress noted. 

## 2020-06-03 ENCOUNTER — Other Ambulatory Visit: Payer: Self-pay | Admitting: *Deleted

## 2020-06-03 ENCOUNTER — Encounter (HOSPITAL_COMMUNITY): Payer: Self-pay

## 2020-06-03 ENCOUNTER — Inpatient Hospital Stay: Payer: Medicare Other

## 2020-06-03 ENCOUNTER — Other Ambulatory Visit: Payer: Self-pay | Admitting: Medical

## 2020-06-03 ENCOUNTER — Telehealth: Payer: Self-pay | Admitting: *Deleted

## 2020-06-03 ENCOUNTER — Emergency Department (HOSPITAL_COMMUNITY)
Admission: EM | Admit: 2020-06-03 | Discharge: 2020-06-03 | Disposition: A | Discharge status: Home / Self Care | Payer: Medicare Other | Source: Home / Self Care

## 2020-06-03 ENCOUNTER — Other Ambulatory Visit: Payer: Self-pay

## 2020-06-03 ENCOUNTER — Encounter (HOSPITAL_COMMUNITY): Payer: Self-pay | Admitting: Hematology

## 2020-06-03 VITALS — BP 153/77 | HR 89 | Temp 98.4°F | Resp 18

## 2020-06-03 DIAGNOSIS — R410 Disorientation, unspecified: Secondary | ICD-10-CM | POA: Diagnosis not present

## 2020-06-03 DIAGNOSIS — C9000 Multiple myeloma not having achieved remission: Secondary | ICD-10-CM

## 2020-06-03 DIAGNOSIS — R112 Nausea with vomiting, unspecified: Secondary | ICD-10-CM | POA: Insufficient documentation

## 2020-06-03 DIAGNOSIS — F23 Brief psychotic disorder: Secondary | ICD-10-CM | POA: Diagnosis not present

## 2020-06-03 DIAGNOSIS — R110 Nausea: Secondary | ICD-10-CM

## 2020-06-03 DIAGNOSIS — Z5321 Procedure and treatment not carried out due to patient leaving prior to being seen by health care provider: Secondary | ICD-10-CM | POA: Insufficient documentation

## 2020-06-03 DIAGNOSIS — Z7189 Other specified counseling: Secondary | ICD-10-CM

## 2020-06-03 LAB — CBC WITH DIFFERENTIAL (CANCER CENTER ONLY)
Abs Immature Granulocytes: 0.15 10*3/uL — ABNORMAL HIGH (ref 0.00–0.07)
Basophils Absolute: 0.1 10*3/uL (ref 0.0–0.1)
Basophils Relative: 1 %
Eosinophils Absolute: 0 10*3/uL (ref 0.0–0.5)
Eosinophils Relative: 0 %
HCT: 38.1 % (ref 36.0–46.0)
Hemoglobin: 12.7 g/dL (ref 12.0–15.0)
Immature Granulocytes: 3 %
Lymphocytes Relative: 9 %
Lymphs Abs: 0.5 10*3/uL — ABNORMAL LOW (ref 0.7–4.0)
MCH: 30.2 pg (ref 26.0–34.0)
MCHC: 33.3 g/dL (ref 30.0–36.0)
MCV: 90.5 fL (ref 80.0–100.0)
Monocytes Absolute: 0.5 10*3/uL (ref 0.1–1.0)
Monocytes Relative: 8 %
Neutro Abs: 4.2 10*3/uL (ref 1.7–7.7)
Neutrophils Relative %: 79 %
Platelet Count: 229 10*3/uL (ref 150–400)
RBC: 4.21 MIL/uL (ref 3.87–5.11)
RDW: 13.7 % (ref 11.5–15.5)
WBC Count: 5.3 10*3/uL (ref 4.0–10.5)
nRBC: 0 % (ref 0.0–0.2)

## 2020-06-03 LAB — CMP (CANCER CENTER ONLY)
ALT: 15 U/L (ref 0–44)
AST: 12 U/L — ABNORMAL LOW (ref 15–41)
Albumin: 4 g/dL (ref 3.5–5.0)
Alkaline Phosphatase: 89 U/L (ref 38–126)
Anion gap: 9 (ref 5–15)
BUN: 6 mg/dL — ABNORMAL LOW (ref 8–23)
CO2: 27 mmol/L (ref 22–32)
Calcium: 9.2 mg/dL (ref 8.9–10.3)
Chloride: 103 mmol/L (ref 98–111)
Creatinine: 0.92 mg/dL (ref 0.44–1.00)
GFR, Estimated: >60 mL/min (ref >60)
Glucose, Bld: 127 mg/dL — ABNORMAL HIGH (ref 70–99)
Potassium: 4.6 mmol/L (ref 3.5–5.1)
Sodium: 139 mmol/L (ref 135–145)
Total Bilirubin: 0.3 mg/dL (ref 0.3–1.2)
Total Protein: 7.2 g/dL (ref 6.5–8.1)

## 2020-06-03 MED ORDER — BORTEZOMIB CHEMO SQ INJECTION 3.5 MG (2.5MG/ML): 1.3000 mg/m2 | Freq: Once | INTRAMUSCULAR | Status: DC

## 2020-06-03 MED ORDER — ONDANSETRON HCL 4 MG/2ML IJ SOLN: 8.0000 mg | Freq: Once | INTRAMUSCULAR | Status: DC

## 2020-06-03 MED ORDER — DEXAMETHASONE 4 MG PO TABS
ORAL_TABLET | ORAL | Status: AC
  Filled 2020-06-03: qty 5

## 2020-06-03 MED ORDER — ONDANSETRON HCL 8 MG PO TABS: 8.0000 mg | ORAL_TABLET | Freq: Once | ORAL | Status: DC

## 2020-06-03 MED ORDER — ONDANSETRON HCL 8 MG PO TABS
ORAL_TABLET | ORAL | Status: AC
  Filled 2020-06-03: qty 1

## 2020-06-03 MED ORDER — DEXAMETHASONE 4 MG PO TABS: 20.0000 mg | ORAL_TABLET | Freq: Once | ORAL | Status: DC

## 2020-06-03 NOTE — Progress Notes (Signed)
Labs not drawn prior to treatment as indicated by the LOS. When it was discovered that labs were needed, patient had already been waiting over an hour since arrival and declined to wait any longer for labs to result before starting treatment. Pt agreed to have labs drawn before discharge and patient will return tomorrow morning for treatment. MD aware.

## 2020-06-03 NOTE — Progress Notes (Signed)
Patient stable upon discharge. Ambulated to exit independently.

## 2020-06-03 NOTE — ED Triage Notes (Signed)
Patient's nephew states that the patient was talking about playing pickle ball prior to her oncology appointment. Once at the appointment, patient told staff that she drove herself and didn't, talking about people like they were in close proximity, but actually are in [place]. Nephew says, "making odd statements." Patient c/o nausea yesterday and emesis x 1 today. No deficits. No slurred speech.

## 2020-06-03 NOTE — Telephone Encounter (Signed)
Notified by Janifer Adie, RN that patient was in lobby - she had spoken with patient at request of staff who stated patient seemed confused.  Patient informed her that she had treatment and was waiting on family. It was confirmed that patient did not receive any medications or chemotherapy in infusion area this morning. After checking in to infusion, she had labs drawn - only. She did not want to wait for labs to result (lab appt was delayed) and rescheduled her chemotherapy for tomorrow. This Probation officer went to lobby and spoke with patient - patient's niece also present. Niece reported she had come in to [place] looking for Ms. Fosberg when she did not call for ride home. Niece [name]) reported patient seemed very confused. Patient oriented to self and knew her neice, stated she was at [facility] clinic in [place]. She stated she had completed treatment and was waiting. Patient said they had given her medication this morning in infusion - she thought it was benadryl. Reine Just, RN from infusion room. She came to lobby to speak with patient and to inform patient that no treatment or medication was given in infusion this morning. Patient confirmed with Benjie Karvonen that she knew her infusion/chemotherapy appt had been rescheduled for 10am tomorrow. Niece very concerned as she stated patient was not confused earlier today.  Dr. Irene Limbo informed of all events - he advised that patient should go to ED for evaluation. Patient's niece in agreement. Patient stated that she did not think it was necessary, but once niece expressed her concerns to her, she agreed to go. Niece asked for wheelchair to take aunt to ED.

## 2020-06-04 ENCOUNTER — Other Ambulatory Visit: Payer: Self-pay | Admitting: Registered Nurse

## 2020-06-04 ENCOUNTER — Emergency Department (HOSPITAL_COMMUNITY): Payer: Medicare Other

## 2020-06-04 ENCOUNTER — Ambulatory Visit (HOSPITAL_BASED_OUTPATIENT_CLINIC_OR_DEPARTMENT_OTHER): Payer: Medicare Other | Admitting: Medical

## 2020-06-04 ENCOUNTER — Inpatient Hospital Stay: Payer: Medicare Other

## 2020-06-04 ENCOUNTER — Other Ambulatory Visit: Payer: Self-pay

## 2020-06-04 ENCOUNTER — Encounter (HOSPITAL_COMMUNITY): Payer: Self-pay | Admitting: Student

## 2020-06-04 ENCOUNTER — Inpatient Hospital Stay (HOSPITAL_COMMUNITY)
Admission: EM | Admit: 2020-06-04 | Discharge: 2020-06-18 | DRG: 885 | Disposition: A | Discharge status: Home / Self Care | Payer: Medicare Other | Attending: Internal Medicine | Admitting: Internal Medicine

## 2020-06-04 VITALS — BP 149/66 | HR 90 | Temp 98.7°F | Resp 18

## 2020-06-04 DIAGNOSIS — Z79899 Other long term (current) drug therapy: Secondary | ICD-10-CM

## 2020-06-04 DIAGNOSIS — Z8616 Personal history of COVID-19: Secondary | ICD-10-CM

## 2020-06-04 DIAGNOSIS — T380X5A Adverse effect of glucocorticoids and synthetic analogues, initial encounter: Secondary | ICD-10-CM | POA: Diagnosis present

## 2020-06-04 DIAGNOSIS — T148XXA Other injury of unspecified body region, initial encounter: Secondary | ICD-10-CM

## 2020-06-04 DIAGNOSIS — Z7189 Other specified counseling: Secondary | ICD-10-CM

## 2020-06-04 DIAGNOSIS — C9000 Multiple myeloma not having achieved remission: Secondary | ICD-10-CM | POA: Diagnosis present

## 2020-06-04 DIAGNOSIS — Z981 Arthrodesis status: Secondary | ICD-10-CM

## 2020-06-04 DIAGNOSIS — R410 Disorientation, unspecified: Secondary | ICD-10-CM

## 2020-06-04 DIAGNOSIS — E876 Hypokalemia: Secondary | ICD-10-CM | POA: Diagnosis not present

## 2020-06-04 DIAGNOSIS — R4182 Altered mental status, unspecified: Secondary | ICD-10-CM | POA: Diagnosis not present

## 2020-06-04 DIAGNOSIS — Z20822 Contact with and (suspected) exposure to covid-19: Secondary | ICD-10-CM | POA: Diagnosis present

## 2020-06-04 DIAGNOSIS — G3184 Mild cognitive impairment, so stated: Secondary | ICD-10-CM | POA: Diagnosis present

## 2020-06-04 DIAGNOSIS — F23 Brief psychotic disorder: Principal | ICD-10-CM | POA: Diagnosis present

## 2020-06-04 DIAGNOSIS — G9340 Encephalopathy, unspecified: Secondary | ICD-10-CM | POA: Diagnosis present

## 2020-06-04 DIAGNOSIS — M542 Cervicalgia: Secondary | ICD-10-CM | POA: Diagnosis present

## 2020-06-04 LAB — CBG MONITORING, ED: Glucose-Capillary: 108 mg/dL — ABNORMAL HIGH (ref 70–99)

## 2020-06-04 LAB — URINALYSIS, COMPLETE (UACMP) WITH MICROSCOPIC
Bacteria, UA: NONE SEEN
Bilirubin Urine: NEGATIVE
Glucose, UA: NEGATIVE mg/dL
Hgb urine dipstick: NEGATIVE
Ketones, ur: NEGATIVE mg/dL
Leukocytes,Ua: NEGATIVE
Nitrite: NEGATIVE
Protein, ur: NEGATIVE mg/dL
Specific Gravity, Urine: 1.003 — ABNORMAL LOW (ref 1.005–1.030)
pH: 6 (ref 5.0–8.0)

## 2020-06-04 LAB — TSH: TSH: 0.531 u[IU]/mL (ref 0.350–4.500)

## 2020-06-04 LAB — COMPREHENSIVE METABOLIC PANEL
ALT: 18 U/L (ref 0–44)
AST: 16 U/L (ref 15–41)
Albumin: 4.5 g/dL (ref 3.5–5.0)
Alkaline Phosphatase: 87 U/L (ref 38–126)
Anion gap: 8 (ref 5–15)
BUN: 5 mg/dL — ABNORMAL LOW (ref 8–23)
CO2: 26 mmol/L (ref 22–32)
Calcium: 8.9 mg/dL (ref 8.9–10.3)
Chloride: 100 mmol/L (ref 98–111)
Creatinine, Ser: 0.87 mg/dL (ref 0.44–1.00)
GFR, Estimated: >60 mL/min (ref >60)
Glucose, Bld: 106 mg/dL — ABNORMAL HIGH (ref 70–99)
Potassium: 3.5 mmol/L (ref 3.5–5.1)
Sodium: 134 mmol/L — ABNORMAL LOW (ref 135–145)
Total Bilirubin: 0.7 mg/dL (ref 0.3–1.2)
Total Protein: 7.3 g/dL (ref 6.5–8.1)

## 2020-06-04 LAB — CBC WITH DIFFERENTIAL/PLATELET
Abs Immature Granulocytes: 0.22 10*3/uL — ABNORMAL HIGH (ref 0.00–0.07)
Basophils Absolute: 0.1 10*3/uL (ref 0.0–0.1)
Basophils Relative: 1 %
Eosinophils Absolute: 0.1 10*3/uL (ref 0.0–0.5)
Eosinophils Relative: 1 %
HCT: 38.7 % (ref 36.0–46.0)
Hemoglobin: 13 g/dL (ref 12.0–15.0)
Immature Granulocytes: 4 %
Lymphocytes Relative: 12 %
Lymphs Abs: 0.7 10*3/uL (ref 0.7–4.0)
MCH: 30.9 pg (ref 26.0–34.0)
MCHC: 33.6 g/dL (ref 30.0–36.0)
MCV: 91.9 fL (ref 80.0–100.0)
Monocytes Absolute: 0.6 10*3/uL (ref 0.1–1.0)
Monocytes Relative: 10 %
Neutro Abs: 4.1 10*3/uL (ref 1.7–7.7)
Neutrophils Relative %: 72 %
Platelets: 239 10*3/uL (ref 150–400)
RBC: 4.21 MIL/uL (ref 3.87–5.11)
RDW: 13.8 % (ref 11.5–15.5)
WBC: 5.7 10*3/uL (ref 4.0–10.5)
nRBC: 0 % (ref 0.0–0.2)

## 2020-06-04 LAB — RAPID URINE DRUG SCREEN, HOSP PERFORMED
Amphetamines: NOT DETECTED
Barbiturates: NOT DETECTED
Benzodiazepines: NOT DETECTED
Cocaine: NOT DETECTED
Opiates: POSITIVE — AB
Tetrahydrocannabinol: NOT DETECTED

## 2020-06-04 LAB — RESP PANEL BY RT-PCR (FLU A&B, COVID) ARPGX2
Influenza A by PCR: NEGATIVE
Influenza B by PCR: NEGATIVE
SARS Coronavirus 2 by RT PCR: NEGATIVE

## 2020-06-04 LAB — VITAMIN B12: Vitamin B-12: 198 pg/mL (ref 180–914)

## 2020-06-04 LAB — AMMONIA: Ammonia: 29 umol/L (ref 9–35)

## 2020-06-04 MED ORDER — SODIUM CHLORIDE 0.9 % IV SOLN: INTRAVENOUS | Status: AC

## 2020-06-04 MED ORDER — ACETAMINOPHEN 650 MG RE SUPP: 650.0000 mg | Freq: Four times a day (QID) | RECTAL | Status: DC | PRN

## 2020-06-04 MED ORDER — ONDANSETRON HCL 4 MG PO TABS
4.0000 mg | ORAL_TABLET | Freq: Four times a day (QID) | ORAL | Status: DC | PRN
  Administered 2020-06-17: 15:00:00 4 mg via ORAL
  Filled 2020-06-04: qty 1

## 2020-06-04 MED ORDER — ACETAMINOPHEN 325 MG PO TABS
650.0000 mg | ORAL_TABLET | Freq: Four times a day (QID) | ORAL | Status: DC | PRN
  Administered 2020-06-05 – 2020-06-11 (×13): 650 mg via ORAL
  Filled 2020-06-04 (×14): qty 2

## 2020-06-04 MED ORDER — ONDANSETRON HCL 4 MG/2ML IJ SOLN
4.0000 mg | Freq: Four times a day (QID) | INTRAMUSCULAR | Status: DC | PRN
  Administered 2020-06-14 – 2020-06-18 (×8): 4 mg via INTRAVENOUS
  Filled 2020-06-04 (×8): qty 2

## 2020-06-04 MED ORDER — ENOXAPARIN SODIUM 40 MG/0.4ML ~~LOC~~ SOLN
40.0000 mg | SUBCUTANEOUS | Status: DC
  Administered 2020-06-06 – 2020-06-16 (×10): 40 mg via SUBCUTANEOUS
  Filled 2020-06-04 (×12): qty 0.4

## 2020-06-04 MED ORDER — DEXAMETHASONE 4 MG PO TABS
ORAL_TABLET | ORAL | Status: AC
  Filled 2020-06-04: qty 5

## 2020-06-04 MED ORDER — BORTEZOMIB CHEMO SQ INJECTION 3.5 MG (2.5MG/ML): 1.3000 mg/m2 | Freq: Once | INTRAMUSCULAR | Status: DC

## 2020-06-04 MED ORDER — DEXAMETHASONE 4 MG PO TABS: 20.0000 mg | ORAL_TABLET | Freq: Once | ORAL | Status: DC

## 2020-06-04 NOTE — H&P (Signed)
History and Physical    Lisa Lester WCB:[id number] DOB: 11/01/1945 DOA: 06/04/2020  PCP: [name], NP   Patient coming from: Home  Chief Complaint  Patient presents with  . Altered Mental Status      HPI: Lisa Lester is a 74 y.o. female with medical history significant for Multiple Myeloma currently getting infusion, coming from Home with altered mental status for 3 days worse today. Patient is a poor historian.  No family at the bedside.  As per the report she is having hallucination, seeing dog in bathroom, increasing agitation. She has hs of Covid in 01/22/19 and her husband died from Covid and she has received 2 days of Covid vaccine.  She was newly diagnosed with multiple myeloma 04/11/2020 seen by Dr. Irene Limbo on 05/28/20,started on Revlimid 2 weeks on/one week off and dexamethasone on day 1, 8, 15, Zometa Q 4 weeks starting 05/28/20. Also her pain management increased with increasing fentanyl, hydrocodone referral to radio once was made for her cervical pain. Patient had a second infusion of her multiple myeloma, she has been having dizziness and having increased confusion onset about 3 days ago.  Is on multiple pain medication including fentanyl and opiates.    She had PET scan- 05/24/20 "1. There are multiple hypermetabolic lytic osseous lesions consistent with multiple myeloma. The most prominent lesion is within the left lateral mass of C1 and this likely involves the foramen transversarium. Probable T3 and sternal pathologic fractures. 2. No evidence of solid visceral organ involvement. 3. Small hypermetabolic subcutaneous nodule anteriorly in the mid right thigh may reflect an incidental soft tissue neoplasm."  ED Course:  VSS, Negative FOR UTI, CT head UDS and COVID pending.  Patient is alert awake oriented but reports that" something is going in my vein in this hospital" one-to-one sitter at the bedside.  She is able to interact follows commands appropriately, nonfocal, no  neck stiffness.  Review of Systems: All systems were reviewed and were negative except as mentioned in HPI above. Negative for fever Negative for chest pain Negative for shortness of breath  Past Medical History:  Diagnosis Date  . Multiple myeloma ([facility])   . Pathologic fracture    L3  . 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 BACK SURGERY    . BREAST ENHANCEMENT SURGERY Bilateral   . LUMBAR FUSION     L2 - L4, hx of pathologic fx of L3     reports that she has never smoked. She has never used smokeless tobacco. She reports that she does not drink alcohol and does not use drugs.  No Known Allergies  Family History  Problem Relation Age of Onset  . Cancer - Other Mother   . Cancer - Other Father      Prior to Admission medications   Medication Sig Start Date End Date Taking? Authorizing Provider  alprazolam Duanne Moron) 2 MG tablet Take 2 mg by mouth at bedtime as needed for sleep.    [provider]  carvedilol (COREG) 6.25 MG tablet Take 6.25 mg by mouth 2 (two) times daily with a meal. Patient not taking: Reported on 04/23/2020    [provider]  dexamethasone (DECADRON) 4 MG tablet Take 5 tablets (20 mg total) by mouth once a week. 05/07/20   Brunetta Genera, MD  DULoxetine (CYMBALTA) 30 MG capsule Take 1 capsule (30 mg total) by mouth daily. 05/07/20   Brunetta Genera, MD  fentaNYL (DURAGESIC) 25 MCG/HR Place 1 patch onto the skin every 3 (three) days. 05/28/20   Brunetta Genera, MD  gabapentin (NEURONTIN) 100 MG capsule Take 1 capsule (100 mg total) by mouth 3 (three) times daily. Patient not taking: Reported on 05/07/2020 05/01/20   [name], NP  HYDROcodone-acetaminophen [facility]) 10-325 MG tablet Take 1-2 tablets by mouth every 6 (six) hours as needed. For moderate to severe pain.  Do not exceed 8 tablets a day. 05/28/20   Brunetta Genera, MD  polyethylene glycol (MIRALAX) 17 g packet Take 17 g  by mouth daily. 05/07/20   Brunetta Genera, MD  senna-docusate (SENNA S) 8.6-50 MG tablet Take 2 tablets by mouth at bedtime. 05/07/20   Brunetta Genera, MD  prochlorperazine (COMPAZINE) 10 MG tablet Take 1 tablet (10 mg total) by mouth every 6 (six) hours as needed (Nausea or vomiting). 05/13/20 06/04/20  Brunetta Genera, MD    Physical Exam: Vitals:   06/04/20 1430 06/04/20 1445 06/04/20 1500 06/04/20 1730  BP: (!) 167/82  (!) 160/137 (!) 148/76  Pulse: 97 97 88 (!) 106  Resp:   18 20  Temp:      TempSrc:      SpO2: 98% 99% 97% 96%  Weight:      Height:        General exam: AAO to place, current month year, current president,weak appearing. HEENT:Oral mucosa moist, Ear/Nose WNL grossly, dentition normal. Respiratory system: bilaterally clear,no wheezing or crackles,no use of accessory muscle Cardiovascular system: S1 & S2 +, No JVD,. Gastrointestinal system: Abdomen soft, NT,ND, BS+ Nervous System:Alert, awake, moving extremities and grossly nonfocal, no neck stiffness Extremities: No edema, distal peripheral pulses palpable.  Skin: No rashes,no icterus. MSK: Normal muscle bulk,tone, power   Labs on Admission: I have personally reviewed following labs and imaging studies  CBC: Recent Labs  Lab 06/03/20 1035 06/04/20 1234  WBC 5.3 5.7  NEUTROABS 4.2 4.1  HGB 12.7 13.0  HCT 38.1 38.7  MCV 90.5 91.9  PLT 229 109   Basic Metabolic Panel: Recent Labs  Lab 06/03/20 1035 06/04/20 1234  NA 139 134*  K 4.6 3.5  CL 103 100  CO2 27 26  GLUCOSE 127* 106*  BUN 6* 5*  CREATININE 0.92 0.87  CALCIUM 9.2 8.9   GFR: Estimated Creatinine Clearance: 59.3 mL/min (by C-G formula based on SCr of 0.87 mg/dL). Liver Function Tests: Recent Labs  Lab 06/03/20 1035 06/04/20 1234  AST 12* 16  ALT 15 18  ALKPHOS 89 87  BILITOT 0.3 0.7  PROT 7.2 7.3  ALBUMIN 4.0 4.5   No results for input(s): LIPASE, AMYLASE in the last 168 hours. Recent Labs  Lab 06/04/20 1224   AMMONIA 29   Coagulation Profile: No results for input(s): INR, PROTIME in the last 168 hours. Cardiac Enzymes: No results for input(s): CKTOTAL, CKMB, CKMBINDEX, TROPONINI in the last 168 hours. BNP (last 3 results) No results for input(s): PROBNP in the last 8760 hours. HbA1C: No results for input(s): HGBA1C in the last 72 hours. CBG: Recent Labs  Lab 06/04/20 1256  GLUCAP 108*   Lipid Profile: No results for input(s): CHOL, HDL, LDLCALC, TRIG, CHOLHDL, LDLDIRECT in the last 72 hours. Thyroid Function Tests: No results for input(s): TSH, T4TOTAL, FREET4, T3FREE, THYROIDAB in the last 72 hours. Anemia Panel: No results for input(s): VITAMINB12, FOLATE, FERRITIN, TIBC, IRON, RETICCTPCT in the last 72 hours. Urine analysis:    Component Value Date/Time   COLORURINE  STRAW (A) 06/04/2020 1311   APPEARANCEUR CLEAR 06/04/2020 1311   LABSPEC 1.003 (L) 06/04/2020 1311   PHURINE 6.0 06/04/2020 1311   GLUCOSEU NEGATIVE 06/04/2020 1311   HGBUR NEGATIVE 06/04/2020 1311   BILIRUBINUR NEGATIVE 06/04/2020 1311   KETONESUR NEGATIVE 06/04/2020 1311   PROTEINUR NEGATIVE 06/04/2020 1311   NITRITE NEGATIVE 06/04/2020 1311   LEUKOCYTESUR NEGATIVE 06/04/2020 1311    Radiological Exams on Admission: CT HEAD WO CONTRAST  Result Date: 06/04/2020 CLINICAL DATA:  Delirium, increased confusion for 2 days. Hallucinations. EXAM: CT HEAD WITHOUT CONTRAST TECHNIQUE: Contiguous axial images were obtained from the base of the skull through the vertex without intravenous contrast. COMPARISON:  None. FINDINGS: Brain: No evidence of large-territorial acute infarction. No parenchymal hemorrhage. No mass lesion. No extra-axial collection. No mass effect or midline shift. No hydrocephalus. Basilar cisterns are patent. Vascular: No hyperdense vessel. Skull: No acute fracture or focal lesion. Sinuses/Orbits: Paranasal sinuses and mastoid air cells are clear. The orbits are unremarkable. Other: None. IMPRESSION: No  acute intracranial abnormality. Electronically Signed   By: Iven Finn M.D.   On: 06/04/2020 15:57     Assessment/Plan  Acute encephalopathy, likely polypharmacy, rule out other etiology.  No evidence of infection or UTI, CT head no acute finding.  Ammonia level  normal.?  Secondary to steroid use/multiple psychotropic medication versus benzo withdrawal , ? psych Issues. She was placed on decadron too. Also on benzo and opiates-recently increased,nephew found empty bottle of ?lorazepam. Apparently on benzo prn but not taking it since 6 days-patient reports her psychiatrist abruptly stopped it. We will placed under observation PT OT eval, continue one-to-one sitter, cont on fall precaution, frequent reorientation.hold psychotropic medications.  We'll hydrate overnight.  Check B12, TSH. if doesn't improve consider neuro/psych evaluation  Multiple myeloma recently diagnosed, has received second infusion.  Seen by Dr. Irene Limbo on 05/28/20 he started on Revlimid 2 weeks on/one week off and dexamethasone on day 1, 8, 15, Zometa Q 4 weeks starting 05/28/20  Polypharmacy with benzo/opiates. Hold meds, reassess in am.  Body mass index is 25.5 kg/m.   Severity of Illness:  * I certify that at the point of admission it is my clinical judgment that the patient will require observation level of care spanning less than 2 midnights from the point of admission due to high intensity of service, high risk for further deterioration and high frequency of surveillance required.*   DVT prophylaxis: lovenox Code Status:   Code Status: Full Code  Family Communication: Admission, patients condition and plan of care including tests being ordered have been discussed with the patient and ED provider.  Consults called: None  Antonieta Pert MD [facility]  If 7PM-7AM, please contact night-coverage [email or web address]  06/04/2020, 6:19 PM

## 2020-06-04 NOTE — ED Notes (Signed)
Per Lucianne Lei, states history of multiple myeloma-family states patient becoming confused and disoriented-came to ED yesterday for eval but left due to wait-followed up at cancer center this am-states she needs to be evaluated

## 2020-06-04 NOTE — Progress Notes (Signed)
No treatment today Per Sandi Mealy, PA and MD Irene Limbo. Pt taken to symptom management to wait for bed in ED

## 2020-06-04 NOTE — ED Notes (Signed)
Patient's nephew at bedside.

## 2020-06-04 NOTE — Progress Notes (Signed)
Tx cancelled today per Doristine Section, RN. Pt seen in infusion by Sandi Mealy, PA.  Kennith Center, Pharm.D., CPP 06/04/2020@11 :02 AM

## 2020-06-04 NOTE — ED Notes (Signed)
Ambulated patient to bathroom slow steady gate

## 2020-06-04 NOTE — ED Notes (Signed)
Door open

## 2020-06-04 NOTE — ED Notes (Signed)
Patient trying to get out of bed

## 2020-06-04 NOTE — ED Notes (Signed)
Patient paranoid and hallucinating at times.
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
## 2020-06-04 NOTE — ED Notes (Signed)
Pt trying to leave bed and hallucinating. [facility] EMT pulled from the floor to sit with her.

## 2020-06-04 NOTE — ED Notes (Signed)
Patient stated as she came out of the bathroom that she saw a dog on the other side of the room

## 2020-06-04 NOTE — Progress Notes (Signed)
Received report from [place], [place] from the Ed at [phone] and pt arrived to unit at 1854, noted.

## 2020-06-04 NOTE — Plan of Care (Signed)
  Problem: Education: Goal: Knowledge of General Education information will improve Description: Including pain rating scale, medication(s)/side effects and non-pharmacologic comfort measures 06/04/2020 2308 by Vivianne Spence, RN Outcome: Progressing 06/04/2020 2308 by Vivianne Spence, RN Outcome: Progressing   Problem: Health Behavior/Discharge Planning: Goal: Ability to manage health-related needs will improve 06/04/2020 2308 by Vivianne Spence, RN Outcome: Progressing 06/04/2020 2308 by Vivianne Spence, RN Outcome: Progressing   Problem: Clinical Measurements: Goal: Ability to maintain clinical measurements within normal limits will improve 06/04/2020 2308 by Vivianne Spence, RN Outcome: Progressing 06/04/2020 2308 by Vivianne Spence, RN Outcome: Progressing Goal: Will remain free from infection 06/04/2020 2308 by Vivianne Spence, RN Outcome: Progressing 06/04/2020 2308 by Vivianne Spence, RN Outcome: Progressing Goal: Diagnostic test results will improve 06/04/2020 2308 by Vivianne Spence, RN Outcome: Progressing 06/04/2020 2308 by Vivianne Spence, RN Outcome: Progressing Goal: Cardiovascular complication will be avoided 06/04/2020 2308 by Vivianne Spence, RN Outcome: Progressing 06/04/2020 2308 by Vivianne Spence, RN Outcome: Progressing   Problem: Coping: Goal: Level of anxiety will decrease 06/04/2020 2308 by Vivianne Spence, RN Outcome: Progressing 06/04/2020 2308 by Vivianne Spence, RN Outcome: Progressing   Problem: Safety: Goal: Ability to remain free from injury will improve 06/04/2020 2308 by Vivianne Spence, RN Outcome: Progressing 06/04/2020 2308 by Vivianne Spence, RN Outcome: Progressing

## 2020-06-04 NOTE — ED Provider Notes (Signed)
[facility] DEPT Provider Note   CSN: [id number] Arrival date & time: 06/04/20  1159     History Chief Complaint  Patient presents with  . Altered Mental Status    Lisa Lester is a 74 y.o. female with pertinent past medical history of multiple myeloma, currently being treated with chemo that presents emergency department today for altered mental status.  Nephew is in the room and is able to provide supplemental history.  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 Recently started chemo infusions for multiple myeloma 2 weeks ago. Per patient she states that she thinks she is here because she is having some dizziness, states that it is not truly dizziness but she feels as if her head is" wavy."  Nephew states that she had her second infusion for her multiple myeloma that she was diagnosed with multiple months ago last week.  He states that for the past couple of days she has been having increased confusion, states she has been coming up with stories that are never occurred with increased agitation.  Nephew states that this is new, for the past 3 days. Patient is on pain medication, opioids for her lytic lesions, also on Benzos, however when speaking to nephew alone, as he states that he thinks that she hasn't taken her benzos in the past 7 days. Nephew states that she has been making innpaorirate statements, saying that she is going to play pickle ball, however he states that she never did play pickle ball.  Nursing states that she came out of the bathroom and she states that she saw her dog on the other side of the room.  No head trauma.  Patient not complaining of anything.  No drug or alcohol use.  No pain anywhere.  No chest pain, shortness of breath, nausea.  1 episode of vomiting yesterday, patient states that she is unsure why this happened.  Not nauseous today.  No fevers or chills at home.  No dysuria or hematuria.  No  abdominal pain.  No sick contacts, no URI symptoms.  No other complaints.   HPI     Past Medical History:  Diagnosis Date  . Multiple myeloma ([place])   . Pathologic fracture    L3  . Plasmacytoma [facility])     Patient Active Problem List   Diagnosis Date Noted  . Altered mental status, unspecified 06/04/2020  . Encephalopathy acute 06/04/2020  . Multiple myeloma not having achieved remission ([place]) 05/13/2020  . Counseling regarding advance care planning and goals of care 05/13/2020    Past Surgical History:  Procedure Laterality Date  . BACK SURGERY    . BREAST ENHANCEMENT SURGERY Bilateral   . LUMBAR FUSION     L2 - L4, hx of pathologic fx of L3     OB History   No obstetric history on file.     Family History  Problem Relation Age of Onset  . Cancer - Other Mother   . Cancer - Other Father     Social History   Tobacco Use  . Smoking status: Never Smoker  . Smokeless tobacco: Never Used  Vaping Use  . Vaping Use: Never used  Substance Use Topics  . Alcohol use: Never  . Drug use: Never    Home Medications Prior to Admission medications   Medication Sig Start Date End Date Taking? Authorizing Provider  alprazolam Duanne Moron) 2 MG tablet Take 2 mg by mouth at bedtime  as needed for sleep.    [provider]  carvedilol (COREG) 6.25 MG tablet Take 6.25 mg by mouth 2 (two) times daily with a meal. Patient not taking: Reported on 04/23/2020    [provider]  dexamethasone (DECADRON) 4 MG tablet Take 5 tablets (20 mg total) by mouth once a week. 05/07/20   Brunetta Genera, MD  DULoxetine (CYMBALTA) 30 MG capsule Take 1 capsule (30 mg total) by mouth daily. 05/07/20   Brunetta Genera, MD  fentaNYL (DURAGESIC) 25 MCG/HR Place 1 patch onto the skin every 3 (three) days. 05/28/20   Brunetta Genera, MD  gabapentin (NEURONTIN) 100 MG capsule Take 1 capsule (100 mg total) by mouth 3 (three) times daily. Patient not taking: Reported on  05/07/2020 05/01/20   [name], NP  HYDROcodone-acetaminophen [facility]) 10-325 MG tablet Take 1-2 tablets by mouth every 6 (six) hours as needed. For moderate to severe pain.  Do not exceed 8 tablets a day. 05/28/20   Brunetta Genera, MD  polyethylene glycol (MIRALAX) 17 g packet Take 17 g by mouth daily. 05/07/20   Brunetta Genera, MD  senna-docusate (SENNA S) 8.6-50 MG tablet Take 2 tablets by mouth at bedtime. 05/07/20   Brunetta Genera, MD  prochlorperazine (COMPAZINE) 10 MG tablet Take 1 tablet (10 mg total) by mouth every 6 (six) hours as needed (Nausea or vomiting). 05/13/20 06/04/20  Brunetta Genera, MD    Allergies    Patient has no known allergies.  Review of Systems   Review of Systems  Constitutional: Negative for chills, diaphoresis, fatigue and fever.  HENT: Negative for congestion, sore throat and trouble swallowing.   Eyes: Negative for pain and visual disturbance.  Respiratory: Negative for cough, shortness of breath and wheezing.   Cardiovascular: Negative for chest pain, palpitations and leg swelling.  Gastrointestinal: Negative for abdominal distention, abdominal pain, diarrhea, nausea and vomiting.  Genitourinary: Negative for difficulty urinating.  Musculoskeletal: Negative for back pain, neck pain and neck stiffness.  Skin: Negative for pallor.  Neurological: Positive for light-headedness. Negative for dizziness, speech difficulty, weakness and headaches.  Psychiatric/Behavioral: Positive for agitation and confusion. Negative for behavioral problems, hallucinations, self-injury, sleep disturbance and suicidal ideas. The patient is not nervous/anxious and is not hyperactive.     Physical Exam Updated Vital Signs BP (!) 148/76   Pulse (!) 106   Temp 98.7 F (37.1 C) (Oral)   Resp 20   Ht _0  (1.676 m)   Wt 71.7 kg   SpO2 96%   BMI 25.50 kg/m   Physical Exam Constitutional:      General: She is not in acute distress.    Appearance:  Normal appearance. She is not ill-appearing, toxic-appearing or diaphoretic.     Comments: Pleasant 74 year old in no acute distress.  HENT:     Head: Normocephalic and atraumatic.     Mouth/Throat:     Mouth: Mucous membranes are moist.     Pharynx: Oropharynx is clear.  Eyes:     General: No scleral icterus.    Extraocular Movements: Extraocular movements intact.     Pupils: Pupils are equal, round, and reactive to light.  Cardiovascular:     Rate and Rhythm: Normal rate and regular rhythm.     Pulses: Normal pulses.     Heart sounds: Normal heart sounds.  Pulmonary:     Effort: Pulmonary effort is normal. No respiratory distress.     Breath sounds: Normal breath sounds. No  stridor. No wheezing, rhonchi or rales.  Chest:     Chest wall: No tenderness.  Abdominal:     General: Abdomen is flat. There is no distension.     Palpations: Abdomen is soft.     Tenderness: There is no abdominal tenderness. There is no guarding or rebound.  Musculoskeletal:        General: No swelling or tenderness. Normal range of motion.     Cervical back: Normal range of motion and neck supple. No rigidity or tenderness.     Right lower leg: No edema.     Left lower leg: No edema.  Skin:    General: Skin is warm and dry.     Capillary Refill: Capillary refill takes less than 2 seconds.     Coloration: Skin is not pale.  Neurological:     General: No focal deficit present.     Mental Status: She is alert and oriented to person, place, and time.     Cranial Nerves: No cranial nerve deficit.     Sensory: No sensory deficit.     Motor: No weakness.     Coordination: Coordination normal.     Gait: Gait normal.     Comments: Alert and oriented x3, patient is oriented to person, time and situation, however thinks that she is in [place] currently.. Clear speech. No facial droop. CNIII-XII grossly intact. Bilateral upper and lower extremities' sensation grossly intact. 5/5 symmetric strength with  grip strength and with plantar and dorsi flexion bilaterally. PNormal finger to nose bilaterally. Negative pronator drift. Negative Romberg sign. Gait is steady and intact.   Psychiatric:        Mood and Affect: Mood normal.        Behavior: Behavior normal.     ED Results / Procedures / Treatments   Labs (all labs ordered are listed, but only abnormal results are displayed) Labs Reviewed  COMPREHENSIVE METABOLIC PANEL - Abnormal; Notable for the following components:      Result Value   Sodium 134 (*)    Glucose, Bld 106 (*)    BUN 5 (*)    All other components within normal limits  CBC WITH DIFFERENTIAL/PLATELET - Abnormal; Notable for the following components:   Abs Immature Granulocytes 0.22 (*)    All other components within normal limits  URINALYSIS, COMPLETE (UACMP) WITH MICROSCOPIC - Abnormal; Notable for the following components:   Color, Urine STRAW (*)    Specific Gravity, Urine 1.003 (*)    All other components within normal limits  CBG MONITORING, ED - Abnormal; Notable for the following components:   Glucose-Capillary 108 (*)    All other components within normal limits  RESP PANEL BY RT-PCR (FLU A&B, COVID) ARPGX2  AMMONIA  RAPID URINE DRUG SCREEN, HOSP PERFORMED  CBC  CREATININE, SERUM  COMPREHENSIVE METABOLIC PANEL  CBC  TSH  VITAMIN B12    EKG EKG Interpretation  Date/Time:  Tuesday June 04 2020 16:32:46 EST Ventricular Rate:  92 PR Interval:    QRS Duration: 86 QT Interval:  350 QTC Calculation: 433 R Axis:   71 Text Interpretation: Sinus rhythm Nonspecific T abnormalities, anterior leads No old tracing to compare Confirmed by Calvert Cantor [phone]) on 06/04/2020 4:35:48 PM   Radiology CT HEAD WO CONTRAST  Result Date: 06/04/2020 CLINICAL DATA:  Delirium, increased confusion for 2 days. Hallucinations. EXAM: CT HEAD WITHOUT CONTRAST TECHNIQUE: Contiguous axial images were obtained from the base of the skull through the vertex without  intravenous contrast. COMPARISON:  None. FINDINGS: Brain: No evidence of large-territorial acute infarction. No parenchymal hemorrhage. No mass lesion. No extra-axial collection. No mass effect or midline shift. No hydrocephalus. Basilar cisterns are patent. Vascular: No hyperdense vessel. Skull: No acute fracture or focal lesion. Sinuses/Orbits: Paranasal sinuses and mastoid air cells are clear. The orbits are unremarkable. Other: None. IMPRESSION: No acute intracranial abnormality. Electronically Signed   By: Iven Finn M.D.   On: 06/04/2020 15:57    Procedures Procedures (including critical care time)  Medications Ordered in ED Medications  enoxaparin (LOVENOX) injection 40 mg (has no administration in time range)  0.9 %  sodium chloride infusion (has no administration in time range)  acetaminophen (TYLENOL) tablet 650 mg (has no administration in time range)    Or  acetaminophen (TYLENOL) suppository 650 mg (has no administration in time range)  ondansetron (ZOFRAN) tablet 4 mg (has no administration in time range)    Or  ondansetron (ZOFRAN) injection 4 mg (has no administration in time range)    ED Course  I have reviewed the triage vital signs and the nursing notes.  Pertinent labs & imaging results that were available during my care of the patient were reviewed by me and considered in my medical decision making (see chart for details).    MDM Rules/Calculators/A&P                          Lisa Lester is a 74 y.o. female with pertinent past medical history of multiple myeloma, currently being treated with chemo infusions that presents emergency department today for altered mental status.  Patient does often hallucinate with accusations and paranoia while I'm in the room.  No known diagnosis of dementia.  Patient has been acting this way for the past 3 days. Differential diagnoses considered include metabolic encephalopathy, intracranial structural lesion, acidosis,  polypharmacy..  Labs unremarkable, urine clean.  Head CT without any acute intracranial abnormality.  Upon reassessment patient is still actively hallucinating, stating that there is a dog and she can hear him barking.  Shared decision making with nephew, we discussed that it is most likely beneficial for her to be admitted at this time for increased delirium.  UDS pending.  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  I did message oncologist, Dr. Irene Limbo so he is aware. 544: Spoke to Dr. Lupita Leash, hospitalist who accept the patient.  Was notified by nursing staff that she had to have sitter because patient is hallucinating and trying to leave bed.  The patient appears reasonably stabilized for admission considering the current resources, flow, and capabilities available in the ED at this time, and I doubt any other [facility] requiring further screening and/or treatment in the ED prior to admission. I discussed this case with my attending physician who cosigned this note including patient's presenting symptoms, physical exam, and planned diagnostics and interventions. Attending physician stated agreement with plan or made changes to plan which were implemented.   Attending physician assessed patient at bedside.  Final Clinical Impression(s) / ED Diagnoses Final diagnoses:  Delirium    Rx / DC Orders ED Discharge Orders    None       Alfredia Client, PA-C 06/04/20 1821    Truddie Hidden, MD 06/05/20 1400

## 2020-06-04 NOTE — ED Notes (Signed)
Fall Bundle: Fall risk armband Yellow socks Bed alarm on Fall risk sign outside door

## 2020-06-04 NOTE — Plan of Care (Signed)
  Problem: Education: Goal: Knowledge of General Education information will improve Description: Including pain rating scale, medication(s)/side effects and non-pharmacologic comfort measures Outcome: Progressing   Problem: Health Behavior/Discharge Planning: Goal: Ability to manage health-related needs will improve Outcome: Progressing   Problem: Clinical Measurements: Goal: Ability to maintain clinical measurements within normal limits will improve Outcome: Progressing Goal: Will remain free from infection Outcome: Progressing Goal: Diagnostic test results will improve Outcome: Progressing Goal: Cardiovascular complication will be avoided Outcome: Progressing   Problem: Coping: Goal: Level of anxiety will decrease Outcome: Progressing   Problem: Safety: Goal: Ability to remain free from injury will improve Outcome: Progressing

## 2020-06-04 NOTE — Progress Notes (Signed)
[facility] Progress Note   Lisa Lester [id number] June 22, 1946 74 y.o.  Lisa Lester is managed by Dr. Sullivan Lone  Actively treated with chemotherapy/immunotherapy/hormonal therapy: yes  Current therapy: Velcade and Decadron  Last treated: 05/31/2020 (cycle #1, day #4)  Next scheduled appointment with provider: 06/17/2020  Assessment: Plan:    Multiple myeloma not having achieved remission ([place])  Altered mental status, unspecified altered mental status type   Multiple myeloma: Lisa Lester is followed by Dr. Irene Limbo and is status post cycle #1, day #4 of  Velcade and Decadron.  She is scheduled to see Dr. Sullivan Lone next on 06/17/2020.  Altered mental status: The patient was directed to go to the emergency room yesterday for evaluation of her altered mental status.  She however left without being seen as she did not want to wait.  She continues to have changes in her mental status which she has had since Sunday.  A CBC and a chemistry panel returned stable today with no issues that would proved to be an etiology of her changes in her mental status.  She was taken to the emergency room for evaluation and management.  Please see After Visit Summary for patient specific instructions.  Future Appointments  Date Time Provider [place]  06/06/2020  9:30 AM [facility] INFUSION [facility] None  06/17/2020  1:30 PM [facility] LAB [facility] None  06/17/2020  2:00 PM Brunetta Genera, MD [facility] None  06/17/2020  3:00 PM [facility] INFUSION [facility] None  06/20/2020  1:15 PM [facility] INFUSION [facility] None  06/24/2020  2:00 PM [facility] LAB [facility] None  06/24/2020  3:00 PM [facility] INFUSION [facility] None  06/27/2020  3:15 PM [facility] INFUSION [facility] None    No orders of the defined types were placed in this encounter.      Subjective:   Patient ID:  Lisa Lester is a 74 y.o. (DOB [birth year])  female.  Chief Complaint: No chief complaint on file.   HPI Lisa Lester is a 74 y.o. female with a diagnosis of a multiple myeloma: She is followed by Dr. Irene Limbo and is status post cycle #1, day #4 of  Velcade and Decadron.  She is scheduled to see Dr. Sullivan Lone next on 06/17/2020.  She was noted to have altered mental status yesterday as she was reporting that she was living in [place] had commented that she had been playing pickle ball.  She was directed to go to the emergency room for evaluation yesterday but left without being seen as she did not want to wait.  She again was noted to have some changes in her mental status today.  According to her nephew whom she lives with, she had been playing with the radio in his car on the way over to her visit today acting as though she thought it was a phone.  When evaluated and the infusion room, she reported that she was currently in [place].  She did state that the president was a Environmental health practitioner but that she could not remember his name.  She did know that she was in a hospital or [facility] and that it was 2021.  She did think that it was Monday and that it was November.  She also became rather animated in the infusion room and was demanding that her pocketbook be brought to her immediately.  She stated that she would not talk to this provider anymore until her pocketbook was brought to her.  She  also accused her nurse of stealing her pocketbook.  Her nephew was keeping it for her.  She was initially not agreeable to be seen in the emergency room and had to be talked to at great length until she was willing to be seen there.  Dr. Sullivan Lone reports that she is on Vicodin, fentanyl patches, Cymbalta, and Xanax.  Medications: I have reviewed the patient's current medications.  Allergies: No Known Allergies  Past Medical History:  Diagnosis Date  . Multiple myeloma ([place])   . Pathologic fracture    L3  . 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 BACK SURGERY    . BREAST ENHANCEMENT SURGERY Bilateral   . LUMBAR FUSION     L2 - L4, hx of pathologic fx of L3    Family History  Problem Relation Age of Onset  . Cancer - Other Mother   . Cancer - Other Father     Social History   Socioeconomic History  . Marital status: Widowed    Spouse name: russell Degante  . Number of children: 0  . Years of education: 33  . Highest education level: 12th grade  Occupational History  . Not on file  Tobacco Use  . Smoking status: Never Smoker  . Smokeless tobacco: Never Used  Vaping Use  . Vaping Use: Never used  Substance and Sexual Activity  . Alcohol use: Never  . Drug use: Never  . Sexual activity: Not Currently  Other Topics Concern  . Not on file  Social History Narrative  . Not on file   Social Determinants of Health   Financial Resource Strain:   . Difficulty of Paying Living Expenses: Not on file  Food Insecurity:   . Worried About Charity fundraiser in the Last Year: Not on file  . Ran Out of Food in the Last Year: Not on file  Transportation Needs:   . Lack of Transportation (Medical): Not on file  . Lack of Transportation (Non-Medical): Not on file  Physical Activity:   . Days of Exercise per Week: Not on file  . Minutes of Exercise per Session: Not on file  Stress:   . Feeling of Stress : Not on file  Social Connections:   . Frequency of Communication with Friends and Family: Not on file  . Frequency of Social Gatherings with Friends and Family: Not on file  . Attends Religious Services: Not on file  . Active Member of Clubs or Organizations: Not on file  . Attends Archivist Meetings: Not on file  . Marital Status: Not on file  Intimate Partner Violence:   . Fear of Current or Ex-Partner: Not on file  . Emotionally Abused: Not on file  . Physically Abused: Not on file  . Sexually Abused: Not on file    Past Medical History, Surgical history, Social  history, and Family history were reviewed and updated as appropriate.   Please see review of systems for further details on the patient's review from today.   Review of Systems:  Review of Systems  Constitutional: Negative for chills, diaphoresis and fever.  HENT: Negative for trouble swallowing and voice change.   Respiratory: Negative for cough, chest tightness, shortness of breath and wheezing.   Cardiovascular: Negative for chest pain and palpitations.  Gastrointestinal: Negative for abdominal pain, constipation, diarrhea, nausea and vomiting.  Musculoskeletal: Negative for back pain and myalgias.  Neurological: Negative for dizziness, light-headedness  and headaches.  Psychiatric/Behavioral: Positive for agitation and confusion.    Objective:   Physical Exam:  There were no vitals taken for this visit. ECOG: 1  Physical Exam Constitutional:      General: She is not in acute distress.    Appearance: She is not diaphoretic.     Comments: Patient was intermittently agitated.  HENT:     Head: Normocephalic and atraumatic.  Cardiovascular:     Rate and Rhythm: Normal rate and regular rhythm.     Heart sounds: Normal heart sounds. No murmur heard.  No friction rub. No gallop.   Pulmonary:     Effort: Pulmonary effort is normal. No respiratory distress.     Breath sounds: Normal breath sounds. No wheezing or rales.  Skin:    General: Skin is warm and dry.     Findings: No erythema or rash.     Lab Review:     Component Value Date/Time   NA 134 (L) 06/04/2020 1234   K 3.5 06/04/2020 1234   CL 100 06/04/2020 1234   CO2 26 06/04/2020 1234   GLUCOSE 106 (H) 06/04/2020 1234   BUN 5 (L) 06/04/2020 1234   CREATININE 0.87 06/04/2020 1234   CREATININE 0.92 06/03/2020 1035   CALCIUM 8.9 06/04/2020 1234   PROT 7.3 06/04/2020 1234   ALBUMIN 4.5 06/04/2020 1234   AST 16 06/04/2020 1234   AST 12 (L) 06/03/2020 1035   ALT 18 06/04/2020 1234   ALT 15 06/03/2020 1035   ALKPHOS  87 06/04/2020 1234   BILITOT 0.7 06/04/2020 1234   BILITOT 0.3 06/03/2020 1035   GFRNONAA >60 06/04/2020 1234   GFRNONAA >60 06/03/2020 1035       Component Value Date/Time   WBC 5.7 06/04/2020 1234   RBC 4.21 06/04/2020 1234   HGB 13.0 06/04/2020 1234   HGB 12.7 06/03/2020 1035   HCT 38.7 06/04/2020 1234   PLT 239 06/04/2020 1234   PLT 229 06/03/2020 1035   MCV 91.9 06/04/2020 1234   MCH 30.9 06/04/2020 1234   MCHC 33.6 06/04/2020 1234   RDW 13.8 06/04/2020 1234   LYMPHSABS 0.7 06/04/2020 1234   MONOABS 0.6 06/04/2020 1234   EOSABS 0.1 06/04/2020 1234   BASOSABS 0.1 06/04/2020 1234   -------------------------------  Imaging from last 24 hours (if applicable):  Radiology interpretation: CT HEAD WO CONTRAST  Result Date: 06/04/2020 CLINICAL DATA:  Delirium, increased confusion for 2 days. Hallucinations. EXAM: CT HEAD WITHOUT CONTRAST TECHNIQUE: Contiguous axial images were obtained from the base of the skull through the vertex without intravenous contrast. COMPARISON:  None. FINDINGS: Brain: No evidence of large-territorial acute infarction. No parenchymal hemorrhage. No mass lesion. No extra-axial collection. No mass effect or midline shift. No hydrocephalus. Basilar cisterns are patent. Vascular: No hyperdense vessel. Skull: No acute fracture or focal lesion. Sinuses/Orbits: Paranasal sinuses and mastoid air cells are clear. The orbits are unremarkable. Other: None. IMPRESSION: No acute intracranial abnormality. Electronically Signed   By: Iven Finn M.D.   On: 06/04/2020 15:57   NM PET Image Initial (PI) Whole Body  Result Date: 05/24/2020 CLINICAL DATA:  Initial treatment strategy for multiple myeloma. EXAM: NUCLEAR MEDICINE PET WHOLE BODY TECHNIQUE: 7.8 mCi F-18 FDG was injected intravenously. Full-ring PET imaging was performed from the head to foot after the radiotracer. CT data was obtained and used for attenuation correction and anatomic localization. Fasting blood  glucose: 110 mg/dl COMPARISON:  None. FINDINGS: Mediastinal blood pool activity: SUV max 2.9 HEAD/ NECK:  No hypermetabolic cervical lymph nodes are identified.There are no lesions of the pharyngeal mucosal space. Incidental CT findings: none CHEST: There are no hypermetabolic mediastinal, hilar or axillary lymph nodes. No hypermetabolic pulmonary activity or suspicious pulmonary nodularity. Incidental CT findings: Mild dependent atelectasis or scarring in both lungs. Mild atherosclerosis of the aorta, great vessels and coronary arteries. Bilateral breast implants are noted. ABDOMEN/PELVIS: There is no hypermetabolic activity within the liver, adrenal glands, spleen or pancreas. There is no hypermetabolic nodal activity. Incidental CT findings: Central 1.9 cm cyst in the right hepatic lobe. Mild aortic and branch vessel atherosclerosis. SKELETON: There are multiple hypermetabolic lytic osseous lesions within the axial skeleton. Within the left lateral mass of C1, there is a 2.2 cm lytic lesion involving the foramen transversarium which is significantly hypermetabolic (SUV max 50.3). There is a lytic lesion in the left aspect of the C4 vertebral body with an SUV max of 6.4. Multiple small lesions are present within the thoracolumbar spine. There are multiple hypermetabolic rib and sternal lesions, including a lower sternal lesion with an SUV max of 6.7 which is associated with an anteriorly displaced fracture, and a lesion involving the left 5th rib laterally with an SUV max of 9.4. Relatively limited involvement of the bony pelvis. Incidental CT findings: Status post L3 corpectomy and L2-4 posterior fusion. Probable T3 pathologic fracture. EXTREMITIES: There are multiple small hypermetabolic lesions within the scapula and clavicle bilaterally. In the distal right humeral shaft, there is a hypermetabolic lesion within SUV max of 3.9. There is a possible lesion in the anterior intertrochanteric region of the left femur  versus adjacent soft tissue activity. No other significant osseous lesions in the lower extremities. There is a small hypermetabolic soft tissue mass anteriorly in the subcutaneous fat of the mid right thigh. This measures 1.3 x 1.0 cm on image 239/4 and has an SUV max of 3.4. Incidental CT findings: none IMPRESSION: 1. There are multiple hypermetabolic lytic osseous lesions consistent with multiple myeloma. The most prominent lesion is within the left lateral mass of C1 and this likely involves the foramen transversarium. Probable T3 and sternal pathologic fractures. 2. No evidence of solid visceral organ involvement. 3. Small hypermetabolic subcutaneous nodule anteriorly in the mid right thigh may reflect an incidental soft tissue neoplasm. Electronically Signed   By: Richardean Sale M.D.   On: 05/24/2020 09:22   CT Biopsy  Result Date: 05/27/2020 INDICATION: Multiple myeloma please perform CT-guided bone marrow biopsy for tissue diagnostic purposes. EXAM: CT-GUIDED BONE MARROW BIOPSY AND ASPIRATION MEDICATIONS: None ANESTHESIA/SEDATION: Fentanyl 100 mcg IV; Versed 2 mg IV Sedation Time: 10 Minutes; The patient was continuously monitored during the procedure by the interventional radiology nurse under my direct supervision. COMPLICATIONS: None immediate. PROCEDURE: Informed consent was obtained from the patient following an explanation of the procedure, risks, benefits and alternatives. The patient understands, agrees and consents for the procedure. All questions were addressed. A time out was performed prior to the initiation of the procedure. The patient was positioned prone and non-contrast localization CT was performed of the pelvis to demonstrate the iliac marrow spaces. The operative site was prepped and draped in the usual sterile fashion. Under sterile conditions and local anesthesia, a 22 gauge spinal needle was utilized for procedural planning. Next, an 11 gauge coaxial bone biopsy needle was  advanced into the left iliac marrow space. Needle position was confirmed with CT imaging. Initially, a bone marrow aspiration was performed. Next, a bone marrow biopsy was obtained with the  11 gauge outer bone marrow device. Samples were prepared with the cytotechnologist and deemed adequate. The needle was removed and superficial hemostasis was obtained with manual compression. A dressing was applied. The patient tolerated the procedure well without immediate post procedural complication. IMPRESSION: Successful CT guided left iliac bone marrow aspiration and core biopsy. Electronically Signed   By: Sandi Mariscal M.D.   On: 05/27/2020 11:31   CT BONE MARROW BIOPSY & ASPIRATION  Result Date: 05/27/2020 INDICATION: Multiple myeloma please perform CT-guided bone marrow biopsy for tissue diagnostic purposes. EXAM: CT-GUIDED BONE MARROW BIOPSY AND ASPIRATION MEDICATIONS: None ANESTHESIA/SEDATION: Fentanyl 100 mcg IV; Versed 2 mg IV Sedation Time: 10 Minutes; The patient was continuously monitored during the procedure by the interventional radiology nurse under my direct supervision. COMPLICATIONS: None immediate. PROCEDURE: Informed consent was obtained from the patient following an explanation of the procedure, risks, benefits and alternatives. The patient understands, agrees and consents for the procedure. All questions were addressed. A time out was performed prior to the initiation of the procedure. The patient was positioned prone and non-contrast localization CT was performed of the pelvis to demonstrate the iliac marrow spaces. The operative site was prepped and draped in the usual sterile fashion. Under sterile conditions and local anesthesia, a 22 gauge spinal needle was utilized for procedural planning. Next, an 11 gauge coaxial bone biopsy needle was advanced into the left iliac marrow space. Needle position was confirmed with CT imaging. Initially, a bone marrow aspiration was performed. Next, a bone  marrow biopsy was obtained with the 11 gauge outer bone marrow device. Samples were prepared with the cytotechnologist and deemed adequate. The needle was removed and superficial hemostasis was obtained with manual compression. A dressing was applied. The patient tolerated the procedure well without immediate post procedural complication. IMPRESSION: Successful CT guided left iliac bone marrow aspiration and core biopsy. Electronically Signed   By: Sandi Mariscal M.D.   On: 05/27/2020 11:31        This case was discussed with Dr. Irene Limbo. He expresses agreement with my management of this patient.

## 2020-06-05 DIAGNOSIS — M542 Cervicalgia: Secondary | ICD-10-CM | POA: Diagnosis not present

## 2020-06-05 DIAGNOSIS — E876 Hypokalemia: Secondary | ICD-10-CM | POA: Diagnosis not present

## 2020-06-05 DIAGNOSIS — F22 Delusional disorders: Secondary | ICD-10-CM | POA: Diagnosis not present

## 2020-06-05 DIAGNOSIS — T380X5A Adverse effect of glucocorticoids and synthetic analogues, initial encounter: Secondary | ICD-10-CM | POA: Diagnosis present

## 2020-06-05 DIAGNOSIS — R410 Disorientation, unspecified: Secondary | ICD-10-CM | POA: Diagnosis present

## 2020-06-05 DIAGNOSIS — G3184 Mild cognitive impairment, so stated: Secondary | ICD-10-CM | POA: Diagnosis present

## 2020-06-05 DIAGNOSIS — Z981 Arthrodesis status: Secondary | ICD-10-CM | POA: Diagnosis not present

## 2020-06-05 DIAGNOSIS — G9340 Encephalopathy, unspecified: Secondary | ICD-10-CM

## 2020-06-05 DIAGNOSIS — F29 Unspecified psychosis not due to a substance or known physiological condition: Secondary | ICD-10-CM | POA: Diagnosis not present

## 2020-06-05 DIAGNOSIS — R4182 Altered mental status, unspecified: Secondary | ICD-10-CM | POA: Diagnosis present

## 2020-06-05 DIAGNOSIS — Z8616 Personal history of COVID-19: Secondary | ICD-10-CM | POA: Diagnosis not present

## 2020-06-05 DIAGNOSIS — I700 Atherosclerosis of aorta: Secondary | ICD-10-CM | POA: Diagnosis not present

## 2020-06-05 DIAGNOSIS — C9000 Multiple myeloma not having achieved remission: Secondary | ICD-10-CM | POA: Diagnosis present

## 2020-06-05 DIAGNOSIS — R451 Restlessness and agitation: Secondary | ICD-10-CM | POA: Diagnosis not present

## 2020-06-05 DIAGNOSIS — Z20822 Contact with and (suspected) exposure to covid-19: Secondary | ICD-10-CM | POA: Diagnosis present

## 2020-06-05 DIAGNOSIS — Z79899 Other long term (current) drug therapy: Secondary | ICD-10-CM | POA: Diagnosis not present

## 2020-06-05 DIAGNOSIS — F23 Brief psychotic disorder: Secondary | ICD-10-CM | POA: Diagnosis present

## 2020-06-05 DIAGNOSIS — R918 Other nonspecific abnormal finding of lung field: Secondary | ICD-10-CM | POA: Diagnosis not present

## 2020-06-05 DIAGNOSIS — R441 Visual hallucinations: Secondary | ICD-10-CM

## 2020-06-05 MED ORDER — HALOPERIDOL 2 MG PO TABS
2.0000 mg | ORAL_TABLET | Freq: Two times a day (BID) | ORAL | Status: DC
  Administered 2020-06-05 – 2020-06-18 (×23): 2 mg via ORAL
  Filled 2020-06-05 (×25): qty 1

## 2020-06-05 MED ORDER — TRAMADOL HCL 50 MG PO TABS
50.0000 mg | ORAL_TABLET | Freq: Four times a day (QID) | ORAL | Status: DC | PRN
  Administered 2020-06-05 – 2020-06-08 (×7): 50 mg via ORAL
  Filled 2020-06-05 (×8): qty 1

## 2020-06-05 NOTE — Progress Notes (Addendum)
[facility]  PROGRESS NOTE  Lisa Lester ZOX:[id number] DOB: 31-Aug-1945 DOA: 06/04/2020 PCP: [name], NP Admit date - 06/04/2020   Admitting Physician Antonieta Pert, MD  Outpatient Primary MD for the patient is [name], NP  LOS - 0 Brief Narrative   Lisa Lester is a 74 y.o. year old female with medical history significant for  is a 74 year old female with medical history significant for multiple myeloma (diagnosed 03/2020)currently undergoing outpatient chemotherapy who presented on 12/7 with 3 days worsening confusion, agitation and paranoid thinking and behavior witnessed by family and outpatient oncologist preventing infusion therapy.  Hospital course complicated by persistent paranoid thoughts, agitation, confusion with intermittent threats to leave [place]  Today she is quite upset and believes her family and her care team want her to stay in the hospital to "get her hard earned money".  She states nothing is wrong with me. A & P   Acute psychosis of unclear etiology.  Increasing confusion, agitation, visual hallucinations and paranoia x3 days.  CT head unremarkable, B12 TSH, ammonia, calcium, UA all unremarkable, UDS positive for opioids.  Could be some element of polypharmacy contributing to symptoms.  Nonfocal physical exam, still following commands.  Another differential includes possible benzodiazepine withdrawal given chronic use and no evidence of this on UDS, but currently no tachycardia, no hypertension -Closely monitor for benzodiazepine withdrawal -Appreciate psychiatry recommendations we will start scheduled Haldol, closely monitor QTC -Close delirium precautions --Would like to get an MRI to ensure no neurologic component, but currently refusing, hopefully haldol will be helpful -Continue one-to-one safety sitter -We will IVC given psychiatry recommends psychiatric inpatient admission once medically cleared -We will continue  to monitor mental status while holding mental status altering medications including her pain regimen (fentanyl, Norco) -Continue Tylenol as needed and low-dose tramadol, closely monitor mental status  Multiple myeloma.  Receives outpatient chemotherapy -Currently holding Revlimid while inpatient -Followed by Dr. Irene Limbo as outpatient,    Family Communication  : Family updated at bedside  Code Status : Full  Disposition Plan  :  Patient is from home. Anticipated d/c date: 2 to 3 days. Barriers to d/c or necessity for inpatient status:  Patient still having persistent confusion, agitation, visual hallucinations of "extreme paranoia.  Psychiatry recommends inpatient psychiatric admission once medically cleared.  Will require involuntary commitment given patient has threatened to leave [place] currently does not have true capacity to make that decision given acute psychosis as mentioned above, unable to formally do capacity evaluation as patient refuses to answer those questions, discussed with psychiatry who agreed to the IVC Consults  : Psychiatry  Procedures  :    DVT Prophylaxis  :  Lovenox   MDM: The below labs and imaging reports were reviewed and summarized above.  Medication management as above.  Lab Results  Component Value Date   PLT 239 06/04/2020    Diet :  Diet Order            Diet regular Room service appropriate? Yes; Fluid consistency: Thin  Diet effective now                  Inpatient Medications Scheduled Meds: . enoxaparin (LOVENOX) injection  40 mg Subcutaneous Q24H  . haloperidol  2 mg Oral BID   Continuous Infusions: . sodium chloride 50 mL/hr at 06/04/20 1846   PRN Meds:.acetaminophen **OR** acetaminophen, ondansetron **OR** ondansetron (ZOFRAN) IV, traMADol  Antibiotics  :   Anti-infectives (From  admission, onward)   None       Objective   Vitals:   06/04/20 1846 06/04/20 1856 06/04/20 2350 06/05/20 0245  BP: (!) 151/77 (!)  150/76 (!) 143/70 (!) 153/80  Pulse: (!) 101 91 88 89  Resp: _0 Temp: 98.6 F (37 C) 99.6 F (37.6 C) 98.7 F (37.1 C) 98.1 F (36.7 C)  TempSrc: Oral Oral Oral   SpO2: 99% 97% 97% 96%  Weight:      Height:        SpO2: 96 %  Wt Readings from Last 3 Encounters:  06/04/20 71.7 kg  06/03/20 70.3 kg  05/31/20 73 kg     Intake/Output Summary (Last 24 hours) at 06/05/2020 1642 Last data filed at 06/05/2020 1400 Gross per 24 hour  Intake 1158.33 ml  Output --  Net 1158.33 ml    Physical Exam:   Awake, alert to place and self,  tangential thought, labile affect, paranoid thoughts Normal respiratory effort on room air No peripheral edema Following commands, no appreciable focal deficit, no slurring, no facial droop or dysarthria   I have personally reviewed the following:   Data Reviewed:  CBC Recent Labs  Lab 06/03/20 1035 06/04/20 1234  WBC 5.3 5.7  HGB 12.7 13.0  HCT 38.1 38.7  PLT 229 239  MCV 90.5 91.9  MCH 30.2 30.9  MCHC 33.3 33.6  RDW 13.7 13.8  LYMPHSABS 0.5* 0.7  MONOABS 0.5 0.6  EOSABS 0.0 0.1  BASOSABS 0.1 0.1    Chemistries  Recent Labs  Lab 06/03/20 1035 06/04/20 1234  NA 139 134*  K 4.6 3.5  CL 103 100  CO2 27 26  GLUCOSE 127* 106*  BUN 6* 5*  CREATININE 0.92 0.87  CALCIUM 9.2 8.9  AST 12* 16  ALT 15 18  ALKPHOS 89 87  BILITOT 0.3 0.7   ------------------------------------------------------------------------------------------------------------------ No results for input(s): CHOL, HDL, LDLCALC, TRIG, CHOLHDL, LDLDIRECT in the last 72 hours.  No results found for: HGBA1C ------------------------------------------------------------------------------------------------------------------ Recent Labs    06/04/20 1234  TSH 0.531   ------------------------------------------------------------------------------------------------------------------ Recent Labs    06/04/20 1234  VITAMINB12 198    Coagulation  profile No results for input(s): INR, PROTIME in the last 168 hours.  No results for input(s): DDIMER in the last 72 hours.  Cardiac Enzymes No results for input(s): CKMB, TROPONINI, MYOGLOBIN in the last 168 hours.  Invalid input(s): CK ------------------------------------------------------------------------------------------------------------------ No results found for: BNP  Micro Results Recent Results (from the past 240 hour(s))  Resp Panel by RT-PCR (Flu A&B, Covid) Nasopharyngeal Swab     Status: None   Collection Time: 06/04/20  5:47 PM   Specimen: Nasopharyngeal Swab; Nasopharyngeal(NP) swabs in vial transport medium  Result Value Ref Range Status   SARS Coronavirus 2 by RT PCR NEGATIVE NEGATIVE Final    Comment: (NOTE) SARS-CoV-2 target nucleic acids are NOT DETECTED.  The SARS-CoV-2 RNA is generally detectable in upper respiratory specimens during the acute phase of infection. The lowest concentration of SARS-CoV-2 viral copies this assay can detect is 138 copies/mL. A negative result does not preclude SARS-Cov-2 infection and should not be used as the sole basis for treatment or other patient management decisions. A negative result may occur with  improper specimen collection/handling, submission of specimen other than nasopharyngeal swab, presence of viral mutation(s) within the areas targeted by this assay, and inadequate number of viral copies(<138 copies/mL). A negative result must be combined with clinical observations, patient history, and epidemiological  information. The expected result is Negative.  Fact Sheet for Patients:  EntrepreneurPulse.com.au  Fact Sheet for Healthcare Providers:  IncredibleEmployment.be  This test is no t yet approved or cleared by the [place] FDA and  has been authorized for detection and/or diagnosis of SARS-CoV-2 by FDA under an Emergency Use Authorization (EUA). This EUA will remain   in effect (meaning this test can be used) for the duration of the COVID-19 declaration under Section 564(b)(1) of the Act, 21 U.S.C.section 360bbb-3(b)(1), unless the authorization is terminated  or revoked sooner.       Influenza A by PCR NEGATIVE NEGATIVE Final   Influenza B by PCR NEGATIVE NEGATIVE Final    Comment: (NOTE) The Xpert Xpress SARS-CoV-2/FLU/RSV plus assay is intended as an aid in the diagnosis of influenza from Nasopharyngeal swab specimens and should not be used as a sole basis for treatment. Nasal washings and aspirates are unacceptable for Xpert Xpress SARS-CoV-2/FLU/RSV testing.  Fact Sheet for Patients: EntrepreneurPulse.com.au  Fact Sheet for Healthcare Providers: IncredibleEmployment.be  This test is not yet approved or cleared by the [place] FDA and has been authorized for detection and/or diagnosis of SARS-CoV-2 by FDA under an Emergency Use Authorization (EUA). This EUA will remain in effect (meaning this test can be used) for the duration of the COVID-19 declaration under Section 564(b)(1) of the Act, 21 U.S.C. section 360bbb-3(b)(1), unless the authorization is terminated or revoked.  Performed at [facility], [place] [address] [place], [place] [address]     Radiology Reports CT HEAD WO CONTRAST  Result Date: 06/04/2020 CLINICAL DATA:  Delirium, increased confusion for 2 days. Hallucinations. EXAM: CT HEAD WITHOUT CONTRAST TECHNIQUE: Contiguous axial images were obtained from the base of the skull through the vertex without intravenous contrast. COMPARISON:  None. FINDINGS: Brain: No evidence of large-territorial acute infarction. No parenchymal hemorrhage. No mass lesion. No extra-axial collection. No mass effect or midline shift. No hydrocephalus. Basilar cisterns are patent. Vascular: No hyperdense vessel. Skull: No acute fracture or focal lesion. Sinuses/Orbits: Paranasal sinuses and  mastoid air cells are clear. The orbits are unremarkable. Other: None. IMPRESSION: No acute intracranial abnormality. Electronically Signed   By: Iven Finn M.D.   On: 06/04/2020 15:57   NM PET Image Initial (PI) Whole Body  Result Date: 05/24/2020 CLINICAL DATA:  Initial treatment strategy for multiple myeloma. EXAM: NUCLEAR MEDICINE PET WHOLE BODY TECHNIQUE: 7.8 mCi F-18 FDG was injected intravenously. Full-ring PET imaging was performed from the head to foot after the radiotracer. CT data was obtained and used for attenuation correction and anatomic localization. Fasting blood glucose: 110 mg/dl COMPARISON:  None. FINDINGS: Mediastinal blood pool activity: SUV max 2.9 HEAD/ NECK: No hypermetabolic cervical lymph nodes are identified.There are no lesions of the pharyngeal mucosal space. Incidental CT findings: none CHEST: There are no hypermetabolic mediastinal, hilar or axillary lymph nodes. No hypermetabolic pulmonary activity or suspicious pulmonary nodularity. Incidental CT findings: Mild dependent atelectasis or scarring in both lungs. Mild atherosclerosis of the aorta, great vessels and coronary arteries. Bilateral breast implants are noted. ABDOMEN/PELVIS: There is no hypermetabolic activity within the liver, adrenal glands, spleen or pancreas. There is no hypermetabolic nodal activity. Incidental CT findings: Central 1.9 cm cyst in the right hepatic lobe. Mild aortic and branch vessel atherosclerosis. SKELETON: There are multiple hypermetabolic lytic osseous lesions within the axial skeleton. Within the left lateral mass of C1, there is a 2.2 cm lytic lesion involving the foramen transversarium which is significantly hypermetabolic (SUV max  15.6). There is a lytic lesion in the left aspect of the C4 vertebral body with an SUV max of 6.4. Multiple small lesions are present within the thoracolumbar spine. There are multiple hypermetabolic rib and sternal lesions, including a lower sternal lesion  with an SUV max of 6.7 which is associated with an anteriorly displaced fracture, and a lesion involving the left 5th rib laterally with an SUV max of 9.4. Relatively limited involvement of the bony pelvis. Incidental CT findings: Status post L3 corpectomy and L2-4 posterior fusion. Probable T3 pathologic fracture. EXTREMITIES: There are multiple small hypermetabolic lesions within the scapula and clavicle bilaterally. In the distal right humeral shaft, there is a hypermetabolic lesion within SUV max of 3.9. There is a possible lesion in the anterior intertrochanteric region of the left femur versus adjacent soft tissue activity. No other significant osseous lesions in the lower extremities. There is a small hypermetabolic soft tissue mass anteriorly in the subcutaneous fat of the mid right thigh. This measures 1.3 x 1.0 cm on image 239/4 and has an SUV max of 3.4. Incidental CT findings: none IMPRESSION: 1. There are multiple hypermetabolic lytic osseous lesions consistent with multiple myeloma. The most prominent lesion is within the left lateral mass of C1 and this likely involves the foramen transversarium. Probable T3 and sternal pathologic fractures. 2. No evidence of solid visceral organ involvement. 3. Small hypermetabolic subcutaneous nodule anteriorly in the mid right thigh may reflect an incidental soft tissue neoplasm. Electronically Signed   By: Richardean Sale M.D.   On: 05/24/2020 09:22   CT Biopsy  Result Date: 05/27/2020 INDICATION: Multiple myeloma please perform CT-guided bone marrow biopsy for tissue diagnostic purposes. EXAM: CT-GUIDED BONE MARROW BIOPSY AND ASPIRATION MEDICATIONS: None ANESTHESIA/SEDATION: Fentanyl 100 mcg IV; Versed 2 mg IV Sedation Time: 10 Minutes; The patient was continuously monitored during the procedure by the interventional radiology nurse under my direct supervision. COMPLICATIONS: None immediate. PROCEDURE: Informed consent was obtained from the patient following  an explanation of the procedure, risks, benefits and alternatives. The patient understands, agrees and consents for the procedure. All questions were addressed. A time out was performed prior to the initiation of the procedure. The patient was positioned prone and non-contrast localization CT was performed of the pelvis to demonstrate the iliac marrow spaces. The operative site was prepped and draped in the usual sterile fashion. Under sterile conditions and local anesthesia, a 22 gauge spinal needle was utilized for procedural planning. Next, an 11 gauge coaxial bone biopsy needle was advanced into the left iliac marrow space. Needle position was confirmed with CT imaging. Initially, a bone marrow aspiration was performed. Next, a bone marrow biopsy was obtained with the 11 gauge outer bone marrow device. Samples were prepared with the cytotechnologist and deemed adequate. The needle was removed and superficial hemostasis was obtained with manual compression. A dressing was applied. The patient tolerated the procedure well without immediate post procedural complication. IMPRESSION: Successful CT guided left iliac bone marrow aspiration and core biopsy. Electronically Signed   By: Sandi Mariscal M.D.   On: 05/27/2020 11:31   CT BONE MARROW BIOPSY & ASPIRATION  Result Date: 05/27/2020 INDICATION: Multiple myeloma please perform CT-guided bone marrow biopsy for tissue diagnostic purposes. EXAM: CT-GUIDED BONE MARROW BIOPSY AND ASPIRATION MEDICATIONS: None ANESTHESIA/SEDATION: Fentanyl 100 mcg IV; Versed 2 mg IV Sedation Time: 10 Minutes; The patient was continuously monitored during the procedure by the interventional radiology nurse under my direct supervision. COMPLICATIONS: None immediate. PROCEDURE: Informed consent  was obtained from the patient following an explanation of the procedure, risks, benefits and alternatives. The patient understands, agrees and consents for the procedure. All questions were  addressed. A time out was performed prior to the initiation of the procedure. The patient was positioned prone and non-contrast localization CT was performed of the pelvis to demonstrate the iliac marrow spaces. The operative site was prepped and draped in the usual sterile fashion. Under sterile conditions and local anesthesia, a 22 gauge spinal needle was utilized for procedural planning. Next, an 11 gauge coaxial bone biopsy needle was advanced into the left iliac marrow space. Needle position was confirmed with CT imaging. Initially, a bone marrow aspiration was performed. Next, a bone marrow biopsy was obtained with the 11 gauge outer bone marrow device. Samples were prepared with the cytotechnologist and deemed adequate. The needle was removed and superficial hemostasis was obtained with manual compression. A dressing was applied. The patient tolerated the procedure well without immediate post procedural complication. IMPRESSION: Successful CT guided left iliac bone marrow aspiration and core biopsy. Electronically Signed   By: Sandi Mariscal M.D.   On: 05/27/2020 11:31     Time Spent in minutes  30     Desiree Hane M.D on 06/05/2020 at 4:42 PM  To page go to [email or web address] - password [facility]

## 2020-06-05 NOTE — Progress Notes (Signed)
Pt more agitated and paranoid than previously in shift. Patient tried to run into the hallway looking for her "dog". When this RN attempted to assist patient back to bed patient stated, "LEAVE my room, you are blocking me in! Get out!". MD made aware. New orders placed.

## 2020-06-05 NOTE — Progress Notes (Signed)
PT Cancellation Note  Patient Details Name: Lisa Lester MRN: [id number] DOB: [birth year]   Cancelled Treatment:    Reason Eval/Treat Not Completed: Pain limiting ability to participate;Other (comment) (Pt reported her neck and back hurt, refused to give a rating, and adamantly refused mobility due to being in pain and "it's not a good time". Pt declined tylenol for pain management. She reports lives alone and that she's independent with mobility at baseline. Per nurse tech, pt ambulated to the bathroom without loss of balance. Will follow.)  [name] PT 06/05/2020  Acute Rehabilitation Services Pager [phone] Office [phone]

## 2020-06-05 NOTE — Progress Notes (Signed)
Pt confused, agitated, and paranoid, AxO to self only. Pt attempted to leave the hospital. Pt jumped out of bed and put coat and shoes on and stated, "I have had enough, I am leaving!" Pt was able to be redirected and a safety sitter was assigned to patients bedside. MD notified.

## 2020-06-05 NOTE — Progress Notes (Signed)
Patient refused physical assessment, except allowing me to listen to her heart and lungs with my stethoscope, she stated "one thing is enough for today and that is it.".  Patient asked for something for her headache, but then was weary about taking the medication and thought it was unsafe. After this RN provided education about the tylenol the patient swallowed the tylenol. Following the patient taking the tylenol, she asked me to go. Safety sitter with the patient and call light within reach.

## 2020-06-05 NOTE — Care Plan (Incomplete)
Capacity Evaluation.  1. Any Language or communication barriers  ( hearing/vision impairments, dysarthria, dysphagia, medical jargon/complicated explanantions)that interfere with the patient's understanding 2. Are there any reversible causes of incapacity that are currently untreated (adverse medication effects, illiict drug use, hypoxia, metabolic abnormalities, acute neurologic/psychiatric disorders, delirium, or critical illness)? 3. Are there any values or culture specific to the patient that may cause questioning of capacity?  Directed Clinical Interview (Patients need to demonstrate a rational explanation in how they arrive at any given decision, if this evaluation shows the patient has adequate understanding/rationale, capacity has been established?  Patient's ability to understand treatment/care options  . What  is your understanding of your condition? Marland Kitchen What is your understading of the benefits of treatment versus risks of treatment? . What are the odds the treatment may work for you?  Marland Kitchen What arte the odds that you may have a side effect or bad outcome? Marland Kitchen What is your understanding of what will happen if nothing is done?  Patient's appreciation of information that applies to his or her situation . What are the important factors to you in deciding your treatment . Do you trust your doctor, Why/why not? Marland Kitchen What do you think will happen to you now?  Patient's ability to communicate and express a choice clearly . Have you decided what medical option is best for you? (we have discussed several choices. What do you want to do?)  Does the patient demonstrate clear capacity with above interview? ***  If No, below is a formal capacity assesment tool :   Aid To Capacity Evaluation   Name of patient:                                                                                                                                                                                          Record observations that support your score in each domain, including exact responses of the patient. Indicate your score for each domain with Yes/Unsure/No)  1.  Able to understand medical problem (Sample questions: What problem are you having right now? What problem is bothering you most? Why are you in the hospital? Do you have [name problem here]?)  Observations:  2.  Able to understand proposed treatment (Sample questions: What is the treatment for [your problem]? What else can we do to help you? Can you have [proposed treatment]?) Observations:                                                                                                                                                                                           3.  Able to understand alternative to proposed treatment (if any) (Sample questions: Are there any other [treatments]? What other options do you have? Can you have [alternative treatment]?) Observations:                                                                                                                                                                                           4.  Able to understand option of refusing proposed treatment (including withholding or withdrawing proposed treatment) (Sample questions: Can you refuse [proposed treatment]? Can we stop [proposed treatment]?)  Observations:  5.  Able to appreciate reasonably foreseeable consequences of accepting proposed treatment (Sample questions: What could happen to you if you have [proposed treatment]? Can [proposed treatment] cause  problems/side effects? Can [proposed treatment] help you live longer?)  Observations:                                                                                                                                                                                           6.  Able to appreciate reasonable foreseeable consequences of refusing proposed treatment (including withholding or withdrawing proposed treatment) (Sample questions: What could happen if you don't have [proposed treatment]? Could you get sicker/die if you don't have [proposed treatment]? What could happen if you have [alternative treatment]? [If alternatives are available])  Observations:                                                                                                                                                                                           NOTE: for questions 7a and 7b, a "yes" answer means the person's decision is affected by depression or psychosis. 7a. The person's decision is affected by depression. (Sample questions: Can you help me understand why you've decided to accept/refuse treatment? Do you feel that you're being punished? Do you think you're a bad person? Do you have any hope for the future? Do you deserve to be treated?)  Observations:  7b. The person's decision is affected by delusion/psychosis. (Sample questions: Can you help me understand why you've decided to accept/refuse treatment? Do you think anyone is trying to hurt/harm you? Do you trust your doctor/nurse?)  Observations:                                                                                                                                                                                           Overall impression*** Definitely  capable Probably capable Probably incapable Definitely incapable   Comments (For example: need for psychiatric assessment, further disclosure and discussion with patient or consultation with family)  The initial ACE assessment is the first step in the capacity assessment process. If the ACE is definitely or probably incapable, considerable treatable or reversible causes of incapacity (e.g., drug toxicity). Repeat the capacity assessment once these factors have been addressed. If the ACE result is probably incapable or probably capable, then take further steps to clarify the situation. For example, if you are unsure about the person's ability to understand the proposed treatment, then a further interview that specifically focuses on this area would be helpful. Similarly, consultation with family, cultural and religious figure, and/or a psychiatrist may clarify some areas of uncertainty. Never base a finding of incapacity solely on your interpretation of domain 7a and 7b. Even if you are sure that the decision is based on a delusion or depression, you should always get an independent assessment.  Time taken to administer ACE:  Minutes:                       Date:  Day:                       Month:                        Year:                      Hour:                       Assessor:  Instructions for scoring 1. Domains 1-4 evaluate whether the person understands his or her current medical problems, the proposed treatment and other options (including withholding or withdrawing treatment). Domains 5 and 6 evaluate whether the person appreciates the consequences of their decision (see sample questions above). 2. For domains 1-6, if the person responds appropriately to open-ended  questions, score yes. If the patient needs repeated prompting by close-ended questions, score unsure.  If the patient cannot respond appropriately despite repeated prompting, score no. 3. For domain 7, if the person appears depressed or psychotic, then decide if the decision is being affected by the depression or psy- chosis. For domain 7a, if the person appears depressed, determine if the decision is affected by depression. Look for the cognitive signs of depression such as hopelessness, worthlessness,  guilt and punishment.  For domain 7b, if the person may be psychotic, deter- mine if the decision is affected by delusion/psychosis. 4. Record observations that support your score in each domain, including exact responses of the patient. 5. Remember that people are presumed capable. Therefore, for your overall impression, if you are uncertain, then err on the side of calling a person capable.   [place]

## 2020-06-05 NOTE — Progress Notes (Addendum)
CSW faxed IVC paperwork to the magistrate at approx 5pm.  CSW awaiting reply now.  CSW spoke to [place] who is sending paperwork back to the CSW now that will need to be served.  CSW called non-emergency dispatch and requested [employer] to arrive to serve pt now.  CSW placed IVC paperwork in chart for RN to give to arriving [facility] officers.  Ed Secretary/RN updated ad Network engineer agrees to make 5 copies of all served/signed paperwork and place all in pt's chart.  CSW will continue to follow for D/C needs.  Alphonse Guild. Haralambos Yeatts, LCSW, LCAS, CSI Clinical Social Worker Ph: [phone]

## 2020-06-05 NOTE — Progress Notes (Signed)
Pt refusing labs to be drawn and vital sign to be obtained. Pt complained of neck pain, tylenol given and heat pack brought to room. Pt continues to be intermittently agitated and confused however remains paranoid. Safety sitter remains at bedside. MD made aware.

## 2020-06-05 NOTE — Progress Notes (Signed)
OT Cancellation Note  Patient Details Name: Lisa Lester MRN: [id number] DOB: [birth year]   Cancelled Treatment:    Reason Eval/Treat Not Completed: Patient declined, no reason specified attempted x2 however patient becomes upset "I'm not going down that road right now." Per CNA patient ambulated to bathroom then transferred to recliner without physical assistance. Will re-attempt tomorrow, patient may be appropriate for screen.  Delbert Phenix OT OT pager: [place] 06/05/2020, 10:15 AM

## 2020-06-05 NOTE — Consult Note (Addendum)
[facility] Psychiatry Consult   Reason for Consult:  Confusion Referring Physician:  Dr. Lonny Prude Patient Identification: Lisa Lester MRN:  [id number] Principal Diagnosis: Altered mental status, unspecified Diagnosis:  Principal Problem:   Altered mental status, unspecified Active Problems:   Multiple myeloma not having achieved remission ([place])   Encephalopathy acute   Total Time spent with patient: 30 minutes  Subjective:   Lisa Lester is a 74 y.o. female patient admitted with altered mental status.  Psych consult placed for confusion, agitation, visual hallucinations, no clear medical etiology. Pt is seen and assessed by this nurse practitioner. On evaluation she is pleasantly confused, yet agitated and paranoid. She states she is a patient of Dr. [name] Gang "you need to call her about any questions you have. " She reports that  She came to the ED to get a CT scan, but is unable to remember what the scan was for. She admits to confusion and makes a reference of "pickle ball and hearing voices." She reports  The confusion has been ongoing for quite some time and that "dr. Mervyn Gay knows all about it. " She then requests to end the evaluation. Prior to leaving when assessing for suicidality patient states " yes sometimes yesterday when in the ER. That behavior in there. No not really I guess you need to ask them in the ER. " Patient exhibiting difficulty finding of words, confused, and tangential. She does appear to be exhibiting delusional thinking and paranoia. She finally states " I dont know how I would hurt myself or why I would hurt myself. " She then requests that I leave again.   HPI:   Lisa Lester is a 74 y.o. female with medical history significant for Multiple Myeloma currently getting infusion, coming from Home with altered mental status for 3 days worse today. Patient is a poor historian.  No family at the bedside.  As per the report she is having hallucination, seeing  dog in bathroom, increasing agitation. She has hs of Covid in 02/05/19 and her husband died from Covid and she has received 2 days of Covid vaccine.  She was newly diagnosed with multiple myeloma 04/11/2020 seen by Dr. Irene Limbo on 05/28/20,started on Revlimid 2 weeks on/one week off and dexamethasone on day 1, 8, 15, Zometa Q 4 weeks starting 05/28/20. Also her pain management increased with increasing fentanyl, hydrocodone referral to radio once was made for her cervical pain. Patient had a second infusion of her multiple myeloma, she has been having dizziness and having increased confusion onset about 3 days ago.  Is on multiple pain medication including fentanyl and opiates.    She had PET scan- 05/24/20 "1. There are multiple hypermetabolic lytic osseous lesions consistent with multiple myeloma. The most prominent lesion is within the left lateral mass of C1 and this likely involves the foramen transversarium. Probable T3 and sternal pathologic fractures. 2. No evidence of solid visceral organ involvement. 3. Small hypermetabolic subcutaneous nodule anteriorly in the mid right thigh may reflect an incidental soft tissue neoplasm."  Past Psychiatric History: Anxiety, previously prescribed lorazepam 2 mg p.o. nightly as needed.  She reports her current psychiatrist is Dr. [name] Gang, and last seen her in the month of November.  She states she has been a long standing patient of Dr. Mervyn Gay, and she is aware of her history.  She denies any previous inpatient admission.  She denies any current or previous suicide attempt.  Risk to Self:  Denies Risk  to Others:  Denies Prior Inpatient Therapy:  Denies Prior Outpatient Therapy:  Dr. [name] Gang  Past Medical History:  Past Medical History:  Diagnosis Date  . Multiple myeloma ([place])   . Pathologic fracture    L3  . 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 BACK SURGERY    . BREAST ENHANCEMENT SURGERY Bilateral    . LUMBAR FUSION     L2 - L4, hx of pathologic fx of L3   Family History:  Family History  Problem Relation Age of Onset  . Cancer - Other Mother   . Cancer - Other Father    Family Psychiatric  History: She denies Social History:  Social History   Substance and Sexual Activity  Alcohol Use Never     Social History   Substance and Sexual Activity  Drug Use Never    Social History   Socioeconomic History  . Marital status: Widowed    Spouse name: russell Leavens  . Number of children: 0  . Years of education: 48  . Highest education level: 12th grade  Occupational History  . Not on file  Tobacco Use  . Smoking status: Never Smoker  . Smokeless tobacco: Never Used  Vaping Use  . Vaping Use: Never used  Substance and Sexual Activity  . Alcohol use: Never  . Drug use: Never  . Sexual activity: Not Currently  Other Topics Concern  . Not on file  Social History Narrative  . Not on file   Social Determinants of Health   Financial Resource Strain:   . Difficulty of Paying Living Expenses: Not on file  Food Insecurity:   . Worried About Charity fundraiser in the Last Year: Not on file  . Ran Out of Food in the Last Year: Not on file  Transportation Needs:   . Lack of Transportation (Medical): Not on file  . Lack of Transportation (Non-Medical): Not on file  Physical Activity:   . Days of Exercise per Week: Not on file  . Minutes of Exercise per Session: Not on file  Stress:   . Feeling of Stress : Not on file  Social Connections:   . Frequency of Communication with Friends and Family: Not on file  . Frequency of Social Gatherings with Friends and Family: Not on file  . Attends Religious Services: Not on file  . Active Member of Clubs or Organizations: Not on file  . Attends Archivist Meetings: Not on file  . Marital Status: Not on file   Additional Social History:    Allergies:  No Known Allergies  Labs:  Results for orders placed or  performed during the hospital encounter of 06/04/20 (from the past 48 hour(s))  Ammonia     Status: None   Collection Time: 06/04/20 12:24 PM  Result Value Ref Range   Ammonia 29 9 - 35 umol/L    Comment: Performed at [facility], [place] [address], [place], [place] [address]  Comprehensive metabolic panel     Status: Abnormal   Collection Time: 06/04/20 12:34 PM  Result Value Ref Range   Sodium 134 (L) 135 - 145 mmol/L   Potassium 3.5 3.5 - 5.1 mmol/L   Chloride 100 98 - 111 mmol/L   CO2 26 22 - 32 mmol/L   Glucose, Bld 106 (H) 70 - 99 mg/dL    Comment: Glucose reference range applies only to samples taken after fasting for  at least 8 hours.   BUN 5 (L) 8 - 23 mg/dL   Creatinine, Ser 0.87 0.44 - 1.00 mg/dL   Calcium 8.9 8.9 - 10.3 mg/dL   Total Protein 7.3 6.5 - 8.1 g/dL   Albumin 4.5 3.5 - 5.0 g/dL   AST 16 15 - 41 U/L   ALT 18 0 - 44 U/L   Alkaline Phosphatase 87 38 - 126 U/L   Total Bilirubin 0.7 0.3 - 1.2 mg/dL   GFR, Estimated >60 >60 mL/min    Comment: (NOTE) Calculated using the CKD-EPI Creatinine Equation (2021)    Anion gap 8 5 - 15    Comment: Performed at [facility], [place] [address] [place], [place] [address]  CBC WITH DIFFERENTIAL     Status: Abnormal   Collection Time: 06/04/20 12:34 PM  Result Value Ref Range   WBC 5.7 4.0 - 10.5 K/uL   RBC 4.21 3.87 - 5.11 MIL/uL   Hemoglobin 13.0 12.0 - 15.0 g/dL   HCT 38.7 36 - 46 %   MCV 91.9 80.0 - 100.0 fL   MCH 30.9 26.0 - 34.0 pg   MCHC 33.6 30.0 - 36.0 g/dL   RDW 13.8 11.5 - 15.5 %   Platelets 239 150 - 400 K/uL   nRBC 0.0 0.0 - 0.2 %   Neutrophils Relative % 72 %   Neutro Abs 4.1 1.7 - 7.7 K/uL   Lymphocytes Relative 12 %   Lymphs Abs 0.7 0.7 - 4.0 K/uL   Monocytes Relative 10 %   Monocytes Absolute 0.6 0.1 - 1.0 K/uL   Eosinophils Relative 1 %   Eosinophils Absolute 0.1 0.0 - 0.5 K/uL   Basophils Relative 1 %   Basophils Absolute 0.1 0.0 - 0.1 K/uL   Immature  Granulocytes 4 %   Abs Immature Granulocytes 0.22 (H) 0.00 - 0.07 K/uL    Comment: Performed at [facility], [place] [address], [place], [place] [address]  TSH     Status: None   Collection Time: 06/04/20 12:34 PM  Result Value Ref Range   TSH 0.531 0.350 - 4.500 uIU/mL    Comment: Performed by a 3rd Generation assay with a functional sensitivity of <=0.01 uIU/mL. Performed at [facility], [place] [address], [place], [place] [address]   Vitamin B12     Status: None   Collection Time: 06/04/20 12:34 PM  Result Value Ref Range   Vitamin B-12 198 180 - 914 pg/mL    Comment: (NOTE) This assay is not validated for testing neonatal or myeloproliferative syndrome specimens for Vitamin B12 levels. Performed at [facility], [place] [address] [place], [place] [address]   CBG monitoring, ED     Status: Abnormal   Collection Time: 06/04/20 12:56 PM  Result Value Ref Range   Glucose-Capillary 108 (H) 70 - 99 mg/dL    Comment: Glucose reference range applies only to samples taken after fasting for at least 8 hours.  Urinalysis, Complete w Microscopic Urine, Clean Catch     Status: Abnormal   Collection Time: 06/04/20  1:11 PM  Result Value Ref Range   Color, Urine STRAW (A) YELLOW   APPearance CLEAR CLEAR   Specific Gravity, Urine 1.003 (L) 1.005 - 1.030   pH 6.0 5.0 - 8.0   Glucose, UA NEGATIVE NEGATIVE mg/dL   Hgb urine dipstick NEGATIVE NEGATIVE   Bilirubin Urine NEGATIVE NEGATIVE   Ketones, ur NEGATIVE NEGATIVE mg/dL   Protein, ur  NEGATIVE NEGATIVE mg/dL   Nitrite NEGATIVE NEGATIVE   Leukocytes,Ua NEGATIVE NEGATIVE   RBC / HPF 0-5 0 - 5 RBC/hpf   WBC, UA 0-5 0 - 5 WBC/hpf   Bacteria, UA NONE SEEN NONE SEEN    Comment: Performed at [facility], [place] [address], [place], [place] [address]  Rapid urine drug screen (hospital performed)     Status: Abnormal   Collection Time: 06/04/20  1:11 PM  Result Value Ref Range    Opiates POSITIVE (A) NONE DETECTED   Cocaine NONE DETECTED NONE DETECTED   Benzodiazepines NONE DETECTED NONE DETECTED   Amphetamines NONE DETECTED NONE DETECTED   Tetrahydrocannabinol NONE DETECTED NONE DETECTED   Barbiturates NONE DETECTED NONE DETECTED    Comment: (NOTE) DRUG SCREEN FOR MEDICAL PURPOSES ONLY.  IF CONFIRMATION IS NEEDED FOR ANY PURPOSE, NOTIFY LAB WITHIN 5 DAYS.  LOWEST DETECTABLE LIMITS FOR URINE DRUG SCREEN Drug Class                     Cutoff (ng/mL) Amphetamine and metabolites    1000 Barbiturate and metabolites    200 Benzodiazepine                 573 Tricyclics and metabolites     300 Opiates and metabolites        300 Cocaine and metabolites        300 THC                            50 Performed at [facility], [place] [address], [place], [place] [address]   Resp Panel by RT-PCR (Flu A&B, Covid) Nasopharyngeal Swab     Status: None   Collection Time: 06/04/20  5:47 PM   Specimen: Nasopharyngeal Swab; Nasopharyngeal(NP) swabs in vial transport medium  Result Value Ref Range   SARS Coronavirus 2 by RT PCR NEGATIVE NEGATIVE    Comment: (NOTE) SARS-CoV-2 target nucleic acids are NOT DETECTED.  The SARS-CoV-2 RNA is generally detectable in upper respiratory specimens during the acute phase of infection. The lowest concentration of SARS-CoV-2 viral copies this assay can detect is 138 copies/mL. A negative result does not preclude SARS-Cov-2 infection and should not be used as the sole basis for treatment or other patient management decisions. A negative result may occur with  improper specimen collection/handling, submission of specimen other than nasopharyngeal swab, presence of viral mutation(s) within the areas targeted by this assay, and inadequate number of viral copies(<138 copies/mL). A negative result must be combined with clinical observations, patient history, and epidemiological information. The expected result is  Negative.  Fact Sheet for Patients:  EntrepreneurPulse.com.au  Fact Sheet for Healthcare Providers:  IncredibleEmployment.be  This test is no t yet approved or cleared by the [place] FDA and  has been authorized for detection and/or diagnosis of SARS-CoV-2 by FDA under an Emergency Use Authorization (EUA). This EUA will remain  in effect (meaning this test can be used) for the duration of the COVID-19 declaration under Section 564(b)(1) of the Act, 21 U.S.C.section 360bbb-3(b)(1), unless the authorization is terminated  or revoked sooner.       Influenza A by PCR NEGATIVE NEGATIVE   Influenza B by PCR NEGATIVE NEGATIVE    Comment: (NOTE) The Xpert Xpress SARS-CoV-2/FLU/RSV plus assay is intended as an aid in the diagnosis of influenza from Nasopharyngeal swab specimens and should not be used as a sole basis  for treatment. Nasal washings and aspirates are unacceptable for Xpert Xpress SARS-CoV-2/FLU/RSV testing.  Fact Sheet for Patients: EntrepreneurPulse.com.au  Fact Sheet for Healthcare Providers: IncredibleEmployment.be  This test is not yet approved or cleared by the [place] FDA and has been authorized for detection and/or diagnosis of SARS-CoV-2 by FDA under an Emergency Use Authorization (EUA). This EUA will remain in effect (meaning this test can be used) for the duration of the COVID-19 declaration under Section 564(b)(1) of the Act, 21 U.S.C. section 360bbb-3(b)(1), unless the authorization is terminated or revoked.  Performed at [facility], [place] [address] [place], [place] [address]     Current Facility-Administered Medications  Medication Dose Route Frequency Provider Last Rate Last Admin  . 0.9 %  sodium chloride infusion   Intravenous Continuous Antonieta Pert, MD 50 mL/hr at 06/04/20 1846 New Bag at 06/04/20 1846  . acetaminophen (TYLENOL) tablet 650 mg  650  mg Oral Q6H PRN Kc, Maren [place], MD   650 mg at 06/05/20 1130   Or  . acetaminophen (TYLENOL) suppository 650 mg  650 mg Rectal Q6H PRN Kc, Ramesh, MD      . enoxaparin (LOVENOX) injection 40 mg  40 mg Subcutaneous Q24H Kc, Ramesh, MD      . ondansetron (ZOFRAN) tablet 4 mg  4 mg Oral Q6H PRN Kc, Ramesh, MD       Or  . ondansetron (ZOFRAN) injection 4 mg  4 mg Intravenous Q6H PRN Antonieta Pert, MD        Musculoskeletal: Strength & Muscle Tone: within normal limits Gait & Station: unsteady Patient leans: N/A  Psychiatric Specialty Exam: Physical Exam  Review of Systems  Blood pressure (!) 153/80, pulse 89, temperature 98.1 F (36.7 C), resp. rate 18, height _0  (1.676 m), weight 71.7 kg, SpO2 96 %.Body mass index is 25.5 kg/m.  General Appearance: Casual  Eye Contact:  Fair  Speech:  Clear and Coherent and Normal Rate  Volume:  Normal  Mood:  Anxious and Euphoric  Affect:  Congruent, Full Range and Restricted  Thought Process:  Coherent, Irrelevant and Descriptions of Associations: Loose  Orientation:  Other:  Alert to self and place only  Thought Content:  Logical, Illogical, Delusions, Ilusions, Paranoid Ideation, Rumination and Tangential  Suicidal Thoughts:  No  Homicidal Thoughts:  No  Memory:  Immediate;   Fair Recent;   Fair  Judgement:  Fair  Insight:  Shallow  Psychomotor Activity:  Normal  Concentration:  Concentration: Fair and Attention Span: Fair  Recall:  Poor  Fund of Knowledge:  Fair  Language:  Fair  Akathisia:  No  Handed:  Right  AIMS (if indicated):     Assets:  Communication Skills Desire for Improvement Financial Resources/Insurance Housing Social Support  ADL's:  Intact  Cognition:  Impaired,  Mild  Sleep:        Treatment Plan Summary: Plan Will start Haldol 2 mg p.o. twice daily for delusions, hallucinations, and paranoid thinking.  Recent EKG obtained on 12 7, shows QTC of 433.  Urine drug screen positive for opiates, which is prescribed and  reviewed as active under PDMP.  Patient is currently under the treatment of oncology for multiple myeloma. -Will start Haldol 81m po BID for psychosis.  -Etiology for acute onset of psychosis remains unknown at this time. Patient UDS was negative for benzodiapines. It is possible she could be withdrawing from BZD as her last refill was 04/30/2020 #60. Although there are no records outside of  04/30/2020 to suggest chronic use of BZD.  -Patient is at risk for developing delirium, would recommend initiating delirium precautions. -Continue one-to-one safety sitter  Disposition: Recommend psychiatric Inpatient admission when medically cleared.  Suella Broad, FNP 06/05/2020 2:13 PM

## 2020-06-06 ENCOUNTER — Encounter (HOSPITAL_COMMUNITY): Payer: Self-pay | Admitting: Hematology

## 2020-06-06 ENCOUNTER — Ambulatory Visit: Payer: Medicare Other

## 2020-06-06 DIAGNOSIS — F23 Brief psychotic disorder: Principal | ICD-10-CM

## 2020-06-06 MED ORDER — OXYCODONE HCL 5 MG PO TABS
5.0000 mg | ORAL_TABLET | Freq: Four times a day (QID) | ORAL | Status: DC | PRN
  Administered 2020-06-06 – 2020-06-07 (×3): 5 mg via ORAL
  Filled 2020-06-06 (×3): qty 1

## 2020-06-06 MED ORDER — CYCLOBENZAPRINE HCL 5 MG PO TABS
5.0000 mg | ORAL_TABLET | Freq: Three times a day (TID) | ORAL | Status: DC
  Administered 2020-06-06 – 2020-06-18 (×37): 5 mg via ORAL
  Filled 2020-06-06 (×37): qty 1

## 2020-06-06 MED ORDER — POLYVINYL ALCOHOL 1.4 % OP SOLN
1.0000 [drp] | OPHTHALMIC | Status: DC | PRN
  Filled 2020-06-06: qty 15

## 2020-06-06 NOTE — Progress Notes (Signed)
OT Cancellation Note  Patient Details Name: Lisa Lester MRN: [id number] DOB: Dec 14, 1945   Cancelled Treatment:    Reason Eval/Treat Not Completed: Other (comment). Patient refused participation due to pain in neck/shoulder. Will attempt one more day. If patient continues to refuse will sign off due non - participation. Of note, Rn reports patient ambulating in room and assisting with ADLs with nursing staff.   [name] [name] 06/06/2020, 12:14 PM

## 2020-06-06 NOTE — Progress Notes (Signed)
[facility]  PROGRESS NOTE  Lisa Lester TDS:[id number] DOB: [birth year] DOA: 06/04/2020 PCP: [name], NP Admit date - 06/04/2020   Admitting Physician Desiree Hane, MD  Outpatient Primary MD for the patient is [name], NP  LOS - 1 Brief Narrative   Lisa Lester is a 74 y.o. year old female with medical history significant for  is a 74 year old female with medical history significant for multiple myeloma (diagnosed 03/2020)currently undergoing outpatient chemotherapy who presented on 12/7 with 3 days worsening confusion, agitation and paranoid thinking and behavior witnessed by family and outpatient oncologist preventing infusion therapy.  Hospital course complicated by persistent paranoid thoughts, agitation, confusion with intermittent threats to leave [place].  Patient was placed under involuntary commitment on 12/8.  Subjective  Today she i she seems much calmer, being more cooperative with staff and to take her medication.  Nursing reports she still seeing things that others do not see including a dog in the hallway and saying odd comments A & P   Acute psychosis of unclear etiology.  Increasing confusion, agitation, visual hallucinations and paranoia x3 days.  CT head unremarkable, B12 TSH, ammonia, calcium, UA all unremarkable, UDS positive for opioids.  Could be some element of polypharmacy contributing to symptoms given on 11/30 patient was on fentanyl patch, increased her hydrocodone and on xanax.  Nonfocal physical exam, still following commands.  Another differential includes possible benzodiazepine withdrawal given chronic use and no evidence of this on UDS, but currently no tachycardia, no hypertension -Closely monitor for benzodiazepine withdrawal -Appreciate psychiatry recommendations , scheduled Haldol, closely monitor QTC -Close delirium precautions --Would like to get an MRI to ensure no neurologic component, but currently refusing,  hopefully haldol will be helpful -Continue one-to-one safety sitter -We will IVC given psychiatry recommends psychiatric inpatient admission once medically cleared -We will continue to monitor mental status while holding mental status altering medications including her pain regimen (fentanyl, Norco)   Head, neck, shoulder pain ongoing for past month, consistent with her multiple myeloma. Likely bone pain.  Seems musculoskeletal in etiology given reproducibility.  Patient states this is typical pain she has at home and I confirmed in chart review ( in Dr. Irene Limbo note on 04/2020).  Does not seem consistent with meningismal signs given no fever, no white count -We will trial Flexeril --Gabapentin listed on med rec but not taking per oncology office visit due to feeling ill aft it at least for past month --continue to avoid xanax  -Continue Tylenol as needed and low-dose tramadol, closely monitor mental status, consider adding low dose hydrocodone if uncontrolled and mental status stable Multiple myeloma.  Receives outpatient chemotherapy -Currently holding Revlimid while inpatient -Followed by Dr. Irene Limbo as outpatient,    Family Communication  : Family updated at bedside  Code Status : Full  Disposition Plan  :  Patient is from home. Anticipated d/c date: 2 to 3 days. Barriers to d/c or necessity for inpatient status:  Patient still having persistent confusion, agitation, visual hallucinations of "extreme paranoia.  Psychiatry recommends inpatient psychiatric admission once medically cleared.  Will require involuntary commitment given patient has threatened to leave [place] currently does not have true capacity to make that decision given acute psychosis as mentioned above, unable to formally do capacity evaluation as patient refuses to answer those questions, discussed with psychiatry who agreed to the IVC Consults  : Psychiatry  Procedures  :    DVT Prophylaxis  :  Lovenox  MDM: The below labs and imaging reports were reviewed and summarized above.  Medication management as above.  Lab Results  Component Value Date   PLT 239 06/04/2020    Diet :  Diet Order            Diet regular Room service appropriate? Yes; Fluid consistency: Thin  Diet effective now                  Inpatient Medications Scheduled Meds: . cyclobenzaprine  5 mg Oral TID  . enoxaparin (LOVENOX) injection  40 mg Subcutaneous Q24H  . haloperidol  2 mg Oral BID   Continuous Infusions:  PRN Meds:.acetaminophen **OR** acetaminophen, ondansetron **OR** ondansetron (ZOFRAN) IV, traMADol  Antibiotics  :   Anti-infectives (From admission, onward)   None       Objective   Vitals:   06/04/20 2350 06/05/20 0245 06/05/20 2230 06/06/20 1445  BP: (!) 143/70 (!) 153/80 (!) 151/73 (!) 147/79  Pulse: 88 89 86 85  Resp: _0 Temp: 98.7 F (37.1 C) 98.1 F (36.7 C) 98.2 F (36.8 C) 98.4 F (36.9 C)  TempSrc: Oral  Oral Axillary  SpO2: 97% 96% 98% 98%  Weight:      Height:        SpO2: 98 %  Wt Readings from Last 3 Encounters:  06/04/20 71.7 kg  06/03/20 70.3 kg  05/31/20 73 kg     Intake/Output Summary (Last 24 hours) at 06/06/2020 1544 Last data filed at 06/06/2020 0930 Gross per 24 hour  Intake 240 ml  Output --  Net 240 ml    Physical Exam:   Awake, alert to place and self,  Calm today. No overt agitation today. Reports still struggling to remember things Normal respiratory effort on room air No peripheral edema Following commands, no appreciable focal deficit, no slurring, no facial droop or dysarthria   I have personally reviewed the following:   Data Reviewed:  CBC Recent Labs  Lab 06/03/20 1035 06/04/20 1234  WBC 5.3 5.7  HGB 12.7 13.0  HCT 38.1 38.7  PLT 229 239  MCV 90.5 91.9  MCH 30.2 30.9  MCHC 33.3 33.6  RDW 13.7 13.8  LYMPHSABS 0.5* 0.7  MONOABS 0.5 0.6  EOSABS 0.0 0.1  BASOSABS 0.1 0.1    Chemistries  Recent  Labs  Lab 06/03/20 1035 06/04/20 1234  NA 139 134*  K 4.6 3.5  CL 103 100  CO2 27 26  GLUCOSE 127* 106*  BUN 6* 5*  CREATININE 0.92 0.87  CALCIUM 9.2 8.9  AST 12* 16  ALT 15 18  ALKPHOS 89 87  BILITOT 0.3 0.7   ------------------------------------------------------------------------------------------------------------------ No results for input(s): CHOL, HDL, LDLCALC, TRIG, CHOLHDL, LDLDIRECT in the last 72 hours.  No results found for: HGBA1C ------------------------------------------------------------------------------------------------------------------ Recent Labs    06/04/20 1234  TSH 0.531   ------------------------------------------------------------------------------------------------------------------ Recent Labs    06/04/20 1234  VITAMINB12 198    Coagulation profile No results for input(s): INR, PROTIME in the last 168 hours.  No results for input(s): DDIMER in the last 72 hours.  Cardiac Enzymes No results for input(s): CKMB, TROPONINI, MYOGLOBIN in the last 168 hours.  Invalid input(s): CK ------------------------------------------------------------------------------------------------------------------ No results found for: BNP  Micro Results Recent Results (from the past 240 hour(s))  Resp Panel by RT-PCR (Flu A&B, Covid) Nasopharyngeal Swab     Status: None   Collection Time: 06/04/20  5:47 PM   Specimen: Nasopharyngeal Swab; Nasopharyngeal(NP) swabs in vial  transport medium  Result Value Ref Range Status   SARS Coronavirus 2 by RT PCR NEGATIVE NEGATIVE Final    Comment: (NOTE) SARS-CoV-2 target nucleic acids are NOT DETECTED.  The SARS-CoV-2 RNA is generally detectable in upper respiratory specimens during the acute phase of infection. The lowest concentration of SARS-CoV-2 viral copies this assay can detect is 138 copies/mL. A negative result does not preclude SARS-Cov-2 infection and should not be used as the sole basis for treatment  or other patient management decisions. A negative result may occur with  improper specimen collection/handling, submission of specimen other than nasopharyngeal swab, presence of viral mutation(s) within the areas targeted by this assay, and inadequate number of viral copies(<138 copies/mL). A negative result must be combined with clinical observations, patient history, and epidemiological information. The expected result is Negative.  Fact Sheet for Patients:  EntrepreneurPulse.com.au  Fact Sheet for Healthcare Providers:  IncredibleEmployment.be  This test is no t yet approved or cleared by the [place] FDA and  has been authorized for detection and/or diagnosis of SARS-CoV-2 by FDA under an Emergency Use Authorization (EUA). This EUA will remain  in effect (meaning this test can be used) for the duration of the COVID-19 declaration under Section 564(b)(1) of the Act, 21 U.S.C.section 360bbb-3(b)(1), unless the authorization is terminated  or revoked sooner.       Influenza A by PCR NEGATIVE NEGATIVE Final   Influenza B by PCR NEGATIVE NEGATIVE Final    Comment: (NOTE) The Xpert Xpress SARS-CoV-2/FLU/RSV plus assay is intended as an aid in the diagnosis of influenza from Nasopharyngeal swab specimens and should not be used as a sole basis for treatment. Nasal washings and aspirates are unacceptable for Xpert Xpress SARS-CoV-2/FLU/RSV testing.  Fact Sheet for Patients: EntrepreneurPulse.com.au  Fact Sheet for Healthcare Providers: IncredibleEmployment.be  This test is not yet approved or cleared by the [place] FDA and has been authorized for detection and/or diagnosis of SARS-CoV-2 by FDA under an Emergency Use Authorization (EUA). This EUA will remain in effect (meaning this test can be used) for the duration of the COVID-19 declaration under Section 564(b)(1) of the Act, 21 U.S.C. section  360bbb-3(b)(1), unless the authorization is terminated or revoked.  Performed at [facility], [place] [address] [place], [place] [address]     Radiology Reports CT HEAD WO CONTRAST  Result Date: 06/04/2020 CLINICAL DATA:  Delirium, increased confusion for 2 days. Hallucinations. EXAM: CT HEAD WITHOUT CONTRAST TECHNIQUE: Contiguous axial images were obtained from the base of the skull through the vertex without intravenous contrast. COMPARISON:  None. FINDINGS: Brain: No evidence of large-territorial acute infarction. No parenchymal hemorrhage. No mass lesion. No extra-axial collection. No mass effect or midline shift. No hydrocephalus. Basilar cisterns are patent. Vascular: No hyperdense vessel. Skull: No acute fracture or focal lesion. Sinuses/Orbits: Paranasal sinuses and mastoid air cells are clear. The orbits are unremarkable. Other: None. IMPRESSION: No acute intracranial abnormality. Electronically Signed   By: Iven Finn M.D.   On: 06/04/2020 15:57   NM PET Image Initial (PI) Whole Body  Result Date: 05/24/2020 CLINICAL DATA:  Initial treatment strategy for multiple myeloma. EXAM: NUCLEAR MEDICINE PET WHOLE BODY TECHNIQUE: 7.8 mCi F-18 FDG was injected intravenously. Full-ring PET imaging was performed from the head to foot after the radiotracer. CT data was obtained and used for attenuation correction and anatomic localization. Fasting blood glucose: 110 mg/dl COMPARISON:  None. FINDINGS: Mediastinal blood pool activity: SUV max 2.9 HEAD/ NECK: No hypermetabolic cervical lymph  nodes are identified.There are no lesions of the pharyngeal mucosal space. Incidental CT findings: none CHEST: There are no hypermetabolic mediastinal, hilar or axillary lymph nodes. No hypermetabolic pulmonary activity or suspicious pulmonary nodularity. Incidental CT findings: Mild dependent atelectasis or scarring in both lungs. Mild atherosclerosis of the aorta, great vessels and coronary  arteries. Bilateral breast implants are noted. ABDOMEN/PELVIS: There is no hypermetabolic activity within the liver, adrenal glands, spleen or pancreas. There is no hypermetabolic nodal activity. Incidental CT findings: Central 1.9 cm cyst in the right hepatic lobe. Mild aortic and branch vessel atherosclerosis. SKELETON: There are multiple hypermetabolic lytic osseous lesions within the axial skeleton. Within the left lateral mass of C1, there is a 2.2 cm lytic lesion involving the foramen transversarium which is significantly hypermetabolic (SUV max 59.7). There is a lytic lesion in the left aspect of the C4 vertebral body with an SUV max of 6.4. Multiple small lesions are present within the thoracolumbar spine. There are multiple hypermetabolic rib and sternal lesions, including a lower sternal lesion with an SUV max of 6.7 which is associated with an anteriorly displaced fracture, and a lesion involving the left 5th rib laterally with an SUV max of 9.4. Relatively limited involvement of the bony pelvis. Incidental CT findings: Status post L3 corpectomy and L2-4 posterior fusion. Probable T3 pathologic fracture. EXTREMITIES: There are multiple small hypermetabolic lesions within the scapula and clavicle bilaterally. In the distal right humeral shaft, there is a hypermetabolic lesion within SUV max of 3.9. There is a possible lesion in the anterior intertrochanteric region of the left femur versus adjacent soft tissue activity. No other significant osseous lesions in the lower extremities. There is a small hypermetabolic soft tissue mass anteriorly in the subcutaneous fat of the mid right thigh. This measures 1.3 x 1.0 cm on image 239/4 and has an SUV max of 3.4. Incidental CT findings: none IMPRESSION: 1. There are multiple hypermetabolic lytic osseous lesions consistent with multiple myeloma. The most prominent lesion is within the left lateral mass of C1 and this likely involves the foramen transversarium.  Probable T3 and sternal pathologic fractures. 2. No evidence of solid visceral organ involvement. 3. Small hypermetabolic subcutaneous nodule anteriorly in the mid right thigh may reflect an incidental soft tissue neoplasm. Electronically Signed   By: Richardean Sale M.D.   On: 05/24/2020 09:22   CT Biopsy  Result Date: 05/27/2020 INDICATION: Multiple myeloma please perform CT-guided bone marrow biopsy for tissue diagnostic purposes. EXAM: CT-GUIDED BONE MARROW BIOPSY AND ASPIRATION MEDICATIONS: None ANESTHESIA/SEDATION: Fentanyl 100 mcg IV; Versed 2 mg IV Sedation Time: 10 Minutes; The patient was continuously monitored during the procedure by the interventional radiology nurse under my direct supervision. COMPLICATIONS: None immediate. PROCEDURE: Informed consent was obtained from the patient following an explanation of the procedure, risks, benefits and alternatives. The patient understands, agrees and consents for the procedure. All questions were addressed. A time out was performed prior to the initiation of the procedure. The patient was positioned prone and non-contrast localization CT was performed of the pelvis to demonstrate the iliac marrow spaces. The operative site was prepped and draped in the usual sterile fashion. Under sterile conditions and local anesthesia, a 22 gauge spinal needle was utilized for procedural planning. Next, an 11 gauge coaxial bone biopsy needle was advanced into the left iliac marrow space. Needle position was confirmed with CT imaging. Initially, a bone marrow aspiration was performed. Next, a bone marrow biopsy was obtained with the 11 gauge outer bone  marrow device. Samples were prepared with the cytotechnologist and deemed adequate. The needle was removed and superficial hemostasis was obtained with manual compression. A dressing was applied. The patient tolerated the procedure well without immediate post procedural complication. IMPRESSION: Successful CT guided left  iliac bone marrow aspiration and core biopsy. Electronically Signed   By: Sandi Mariscal M.D.   On: 05/27/2020 11:31   CT BONE MARROW BIOPSY & ASPIRATION  Result Date: 05/27/2020 INDICATION: Multiple myeloma please perform CT-guided bone marrow biopsy for tissue diagnostic purposes. EXAM: CT-GUIDED BONE MARROW BIOPSY AND ASPIRATION MEDICATIONS: None ANESTHESIA/SEDATION: Fentanyl 100 mcg IV; Versed 2 mg IV Sedation Time: 10 Minutes; The patient was continuously monitored during the procedure by the interventional radiology nurse under my direct supervision. COMPLICATIONS: None immediate. PROCEDURE: Informed consent was obtained from the patient following an explanation of the procedure, risks, benefits and alternatives. The patient understands, agrees and consents for the procedure. All questions were addressed. A time out was performed prior to the initiation of the procedure. The patient was positioned prone and non-contrast localization CT was performed of the pelvis to demonstrate the iliac marrow spaces. The operative site was prepped and draped in the usual sterile fashion. Under sterile conditions and local anesthesia, a 22 gauge spinal needle was utilized for procedural planning. Next, an 11 gauge coaxial bone biopsy needle was advanced into the left iliac marrow space. Needle position was confirmed with CT imaging. Initially, a bone marrow aspiration was performed. Next, a bone marrow biopsy was obtained with the 11 gauge outer bone marrow device. Samples were prepared with the cytotechnologist and deemed adequate. The needle was removed and superficial hemostasis was obtained with manual compression. A dressing was applied. The patient tolerated the procedure well without immediate post procedural complication. IMPRESSION: Successful CT guided left iliac bone marrow aspiration and core biopsy. Electronically Signed   By: Sandi Mariscal M.D.   On: 05/27/2020 11:31     Time Spent in minutes   30     Desiree Hane M.D on 06/06/2020 at 3:44 PM  To page go to [email or web address] - password [facility]

## 2020-06-06 NOTE — Progress Notes (Signed)
Pt refusing assessment this morning, stating "don't come near me" and "please don't ask me any questions". Pt was agreeable to taking Tylenol and scheduled Haldol. MD made aware. Will continue to monitor patient.

## 2020-06-06 NOTE — TOC Progression Note (Signed)
Transition of Care [facility]) - Progression Note    Patient Details  Name: Lisa Lester MRN: [id number] Date of Birth: October 27, 1945  [facility] CM/SW Contact  Akirah Storck, Juliann Pulse, RN Phone Number: 06/06/2020, 11:56 AM  Clinical Narrative: IVC;Not medically stable. Once medically stable will fax to [employer] facilities.            Expected Discharge Plan and Services                                                 Social Determinants of Health (SDOH) Interventions    Readmission Risk Interventions No flowsheet data found.

## 2020-06-06 NOTE — Progress Notes (Signed)
PT Cancellation Note  Patient Details Name: Lisa Lester MRN: [id number] DOB: Oct 11, 1945   Cancelled Treatment:    Reason Eval/Treat Not Completed: PT screened, no needs identified, will sign off  RN reports pt has been up ad lib in her room and will refuse PT.   RN does not believe pt has any PT needs at this time.  Current d/c plan is for inpatient psych facility.  PT to sign off.   [name],KATHrine E 06/06/2020, 12:04 PM Arlyce Dice, DPT Acute Rehabilitation Services Pager: [phone] Office: [phone]

## 2020-06-07 ENCOUNTER — Inpatient Hospital Stay (HOSPITAL_COMMUNITY): Payer: Medicare Other

## 2020-06-07 DIAGNOSIS — F29 Unspecified psychosis not due to a substance or known physiological condition: Secondary | ICD-10-CM

## 2020-06-07 MED ORDER — MORPHINE SULFATE (PF) 2 MG/ML IV SOLN
1.0000 mg | INTRAVENOUS | Status: AC | PRN
  Administered 2020-06-07: 1 mg via INTRAVENOUS
  Filled 2020-06-07: qty 1

## 2020-06-07 MED ORDER — OXYCODONE HCL 5 MG PO TABS
5.0000 mg | ORAL_TABLET | Freq: Four times a day (QID) | ORAL | Status: DC | PRN
  Administered 2020-06-07: 10 mg via ORAL
  Administered 2020-06-07: 5 mg via ORAL
  Administered 2020-06-08 (×2): 10 mg via ORAL
  Filled 2020-06-07: qty 2
  Filled 2020-06-07: qty 1
  Filled 2020-06-07 (×3): qty 2

## 2020-06-07 MED ORDER — MORPHINE SULFATE (PF) 2 MG/ML IV SOLN
1.0000 mg | Freq: Once | INTRAVENOUS | Status: AC
Start: 2020-06-07 — End: 2020-06-07
  Administered 2020-06-07: 1 mg via INTRAVENOUS
  Filled 2020-06-07: qty 1

## 2020-06-07 NOTE — Progress Notes (Signed)
OT Cancellation Note  Patient Details Name: SALISA BROZ MRN: [id number] DOB: 1945-08-30   Cancelled Treatment:    Reason Eval/Treat Not Completed: Other (comment). Patient refused OT evaluation for third day in a row. Per Nursing patient ambulating in room and performing ADLs. OT will sign off. If patient agreeable to therapy in the future and deficits noted - please reorder as needed.  Denea Cheaney L Keiley Levey 06/07/2020, 1:58 PM

## 2020-06-07 NOTE — Progress Notes (Signed)
[facility]  PROGRESS NOTE  Lisa Lester ALP:[id number] DOB: [birth year] DOA: 06/04/2020 PCP: [name], NP Admit date - 06/04/2020   Admitting Physician Desiree Hane, MD  Outpatient Primary MD for the patient is [name], NP  LOS - 2 Brief Narrative   Lisa Lester is a 74 y.o. year old female with medical history significant for  is a 74 year old female with medical history significant for multiple myeloma (diagnosed 03/2020)currently undergoing outpatient chemotherapy who presented on 12/7 with 3 days worsening confusion, agitation and paranoid thinking and behavior witnessed by family and outpatient oncologist preventing infusion therapy.  Hospital course complicated by persistent paranoid thoughts, agitation, confusion with intermittent threats to leave [facility].  Patient was placed under involuntary commitment on 12/8.  Subjective  Still reporting significant neck pain not well controlled on current oxycodone dosage.  States easily able for brain MRI if pain can be better controlled A & P   Acute psychosis of unclear etiology.  Increasing confusion, agitation, visual hallucinations and paranoia x3 days.  Suspect could be related to Decadron received with infusion therapy (40 mg) CT head unremarkable, B12 TSH, ammonia, calcium, UA all unremarkable, UDS positive for opioids.  Could be some element of polypharmacy contributing to symptoms given on 11/30 patient was on fentanyl patch, increased hydrocodone and on xanax.  Nonfocal physical exam, still following commands.  Another differential includes possible benzodiazepine withdrawal given chronic use and no evidence of this on UDS, but currently no tachycardia, no hypertension.  Seems much more calm and less paranoid today, also less agitated, more agreeable to discuss treatment plan -Closely monitor for benzodiazepine withdrawal -Appreciate psychiatry recommendations , scheduled Haldol, closely monitor  QTC -Close delirium precautions --Agreeable to allow brain MRI if neck pain can be better controlled -Continue one-to-one safety sitter -IVC on 12/8 given psychiatry recommends psychiatric inpatient admission once medically cleared -We will continue to monitor mental status while holding mental status altering medications including her pain regimen (fentanyl, Norco)  Head, neck, shoulder pain ongoing for past month, consistent with her multiple myeloma. Likely bone pain.  Seems musculoskeletal in etiology given reproducibility.  Patient states this is typical pain she has at home and I confirmed in chart review ( in Dr. Irene Limbo note on 04/2020).  Does not seem consistent with meningismal signs given no fever, no leukocytosis, and no worsening mental status despite not receiving any antibiotics.  Flexeril was not helpful --Gabapentin listed on med rec but not taking per oncology office visit due to feeling ill  --continue to avoid xanax  -Continue Tylenol as needed and low-dose tramadol, slowly increase oxycodone to 5 to 10 mg every 6 hours -One-time dose of IV morphine 1 mg to assist in better pain control to allow for MRI brain with close monitoring of mental status, patient family agreeable with plan   Multiple myeloma.  Receives outpatient chemotherapy -Currently holding Revlimid while inpatient -Followed by Dr. Irene Limbo as outpatient,    Family Communication  : Family updated at bedside  Code Status : Full  Disposition Plan  :  Patient is from home. Anticipated d/c date: 2 to 3 days. Barriers to d/c or necessity for inpatient status:  Still having persistent neck pain, would like to get MRI brain once neck pain better controlled requiring IV pain medications with close monitoring of mental status, seems more calm today with no overt visual hallucinations on my examination, Psychiatry recommends inpatient psychiatric admission once medically cleared.  Will require involuntary commitment  given  patient has threatened to leave [facility] currently does not have true capacity to make that decision given acute psychosis as mentioned above, unable to formally do capacity evaluation as patient refuses to answer those questions, discussed with psychiatry who agreed to the IVC Consults  : Psychiatry  Procedures  :    DVT Prophylaxis  :  Lovenox   MDM: The below labs and imaging reports were reviewed and summarized above.  Medication management as above.  Lab Results  Component Value Date   PLT 239 06/04/2020    Diet :  Diet Order            Diet regular Room service appropriate? Yes; Fluid consistency: Thin  Diet effective now                  Inpatient Medications Scheduled Meds: . cyclobenzaprine  5 mg Oral TID  . enoxaparin (LOVENOX) injection  40 mg Subcutaneous Q24H  . haloperidol  2 mg Oral BID   Continuous Infusions:  PRN Meds:.acetaminophen **OR** acetaminophen, ondansetron **OR** ondansetron (ZOFRAN) IV, oxyCODONE, polyvinyl alcohol, traMADol  Antibiotics  :   Anti-infectives (From admission, onward)   None       Objective   Vitals:   06/05/20 2230 06/06/20 1445 06/07/20 0451 06/07/20 1524  BP: (!) 151/73 (!) 147/79 (!) 145/68 113/79  Pulse: 86 85 84 95  Resp: '18 14 16 20  ' Temp: 98.2 F (36.8 C) 98.4 F (36.9 C) 98.6 F (37 C) 98.9 F (37.2 C)  TempSrc: Oral Axillary Oral Oral  SpO2: 98% 98% 98% 98%  Weight:      Height:        SpO2: 98 %  Wt Readings from Last 3 Encounters:  06/04/20 71.7 kg  06/03/20 70.3 kg  05/31/20 73 kg     Intake/Output Summary (Last 24 hours) at 06/07/2020 1654 Last data filed at 06/07/2020 1532 Gross per 24 hour  Intake 595 ml  Output --  Net 595 ml    Physical Exam:   Awake, alert to place and self, In discomfort but no acute distress, neck pain not reproducible by palpation on examination, limited neck range of motion Calm today. No overt agitation today. Reports still struggling to  remember things Normal respiratory effort on room air No peripheral edema Following commands, no appreciable focal deficit, no slurring, no facial droop or dysarthria   I have personally reviewed the following:   Data Reviewed:  CBC Recent Labs  Lab 06/03/20 1035 06/04/20 1234  WBC 5.3 5.7  HGB 12.7 13.0  HCT 38.1 38.7  PLT 229 239  MCV 90.5 91.9  MCH 30.2 30.9  MCHC 33.3 33.6  RDW 13.7 13.8  LYMPHSABS 0.5* 0.7  MONOABS 0.5 0.6  EOSABS 0.0 0.1  BASOSABS 0.1 0.1    Chemistries  Recent Labs  Lab 06/03/20 1035 06/04/20 1234  NA 139 134*  K 4.6 3.5  CL 103 100  CO2 27 26  GLUCOSE 127* 106*  BUN 6* 5*  CREATININE 0.92 0.87  CALCIUM 9.2 8.9  AST 12* 16  ALT 15 18  ALKPHOS 89 87  BILITOT 0.3 0.7   ------------------------------------------------------------------------------------------------------------------ No results for input(s): CHOL, HDL, LDLCALC, TRIG, CHOLHDL, LDLDIRECT in the last 72 hours.  No results found for: HGBA1C ------------------------------------------------------------------------------------------------------------------ No results for input(s): TSH, T4TOTAL, T3FREE, THYROIDAB in the last 72 hours.  Invalid input(s): FREET3 ------------------------------------------------------------------------------------------------------------------ No results for input(s): VITAMINB12, FOLATE, FERRITIN, TIBC, IRON, RETICCTPCT in the last 72  hours.  Coagulation profile No results for input(s): INR, PROTIME in the last 168 hours.  No results for input(s): DDIMER in the last 72 hours.  Cardiac Enzymes No results for input(s): CKMB, TROPONINI, MYOGLOBIN in the last 168 hours.  Invalid input(s): CK ------------------------------------------------------------------------------------------------------------------ No results found for: BNP  Micro Results Recent Results (from the past 240 hour(s))  Resp Panel by RT-PCR (Flu A&B, Covid) Nasopharyngeal  Swab     Status: None   Collection Time: 06/04/20  5:47 PM   Specimen: Nasopharyngeal Swab; Nasopharyngeal(NP) swabs in vial transport medium  Result Value Ref Range Status   SARS Coronavirus 2 by RT PCR NEGATIVE NEGATIVE Final    Comment: (NOTE) SARS-CoV-2 target nucleic acids are NOT DETECTED.  The SARS-CoV-2 RNA is generally detectable in upper respiratory specimens during the acute phase of infection. The lowest concentration of SARS-CoV-2 viral copies this assay can detect is 138 copies/mL. A negative result does not preclude SARS-Cov-2 infection and should not be used as the sole basis for treatment or other patient management decisions. A negative result may occur with  improper specimen collection/handling, submission of specimen other than nasopharyngeal swab, presence of viral mutation(s) within the areas targeted by this assay, and inadequate number of viral copies(<138 copies/mL). A negative result must be combined with clinical observations, patient history, and epidemiological information. The expected result is Negative.  Fact Sheet for Patients:  EntrepreneurPulse.com.au  Fact Sheet for Healthcare Providers:  IncredibleEmployment.be  This test is no t yet approved or cleared by the [place] FDA and  has been authorized for detection and/or diagnosis of SARS-CoV-2 by FDA under an Emergency Use Authorization (EUA). This EUA will remain  in effect (meaning this test can be used) for the duration of the COVID-19 declaration under Section 564(b)(1) of the Act, 21 U.S.C.section 360bbb-3(b)(1), unless the authorization is terminated  or revoked sooner.       Influenza A by PCR NEGATIVE NEGATIVE Final   Influenza B by PCR NEGATIVE NEGATIVE Final    Comment: (NOTE) The Xpert Xpress SARS-CoV-2/FLU/RSV plus assay is intended as an aid in the diagnosis of influenza from Nasopharyngeal swab specimens and should not be used as a sole  basis for treatment. Nasal washings and aspirates are unacceptable for Xpert Xpress SARS-CoV-2/FLU/RSV testing.  Fact Sheet for Patients: EntrepreneurPulse.com.au  Fact Sheet for Healthcare Providers: IncredibleEmployment.be  This test is not yet approved or cleared by the [place] FDA and has been authorized for detection and/or diagnosis of SARS-CoV-2 by FDA under an Emergency Use Authorization (EUA). This EUA will remain in effect (meaning this test can be used) for the duration of the COVID-19 declaration under Section 564(b)(1) of the Act, 21 U.S.C. section 360bbb-3(b)(1), unless the authorization is terminated or revoked.  Performed at [facility], [place] [address], [place], [place] [address]     Radiology Reports CT HEAD WO CONTRAST  Result Date: 06/04/2020 CLINICAL DATA:  Delirium, increased confusion for 2 days. Hallucinations. EXAM: CT HEAD WITHOUT CONTRAST TECHNIQUE: Contiguous axial images were obtained from the base of the skull through the vertex without intravenous contrast. COMPARISON:  None. FINDINGS: Brain: No evidence of large-territorial acute infarction. No parenchymal hemorrhage. No mass lesion. No extra-axial collection. No mass effect or midline shift. No hydrocephalus. Basilar cisterns are patent. Vascular: No hyperdense vessel. Skull: No acute fracture or focal lesion. Sinuses/Orbits: Paranasal sinuses and mastoid air cells are clear. The orbits are unremarkable. Other: None. IMPRESSION: No acute intracranial abnormality. Electronically Signed  By: Iven Finn M.D.   On: 06/04/2020 15:57   NM PET Image Initial (PI) Whole Body  Result Date: 05/24/2020 CLINICAL DATA:  Initial treatment strategy for multiple myeloma. EXAM: NUCLEAR MEDICINE PET WHOLE BODY TECHNIQUE: 7.8 mCi F-18 FDG was injected intravenously. Full-ring PET imaging was performed from the head to foot after the radiotracer. CT data was  obtained and used for attenuation correction and anatomic localization. Fasting blood glucose: 110 mg/dl COMPARISON:  None. FINDINGS: Mediastinal blood pool activity: SUV max 2.9 HEAD/ NECK: No hypermetabolic cervical lymph nodes are identified.There are no lesions of the pharyngeal mucosal space. Incidental CT findings: none CHEST: There are no hypermetabolic mediastinal, hilar or axillary lymph nodes. No hypermetabolic pulmonary activity or suspicious pulmonary nodularity. Incidental CT findings: Mild dependent atelectasis or scarring in both lungs. Mild atherosclerosis of the aorta, great vessels and coronary arteries. Bilateral breast implants are noted. ABDOMEN/PELVIS: There is no hypermetabolic activity within the liver, adrenal glands, spleen or pancreas. There is no hypermetabolic nodal activity. Incidental CT findings: Central 1.9 cm cyst in the right hepatic lobe. Mild aortic and branch vessel atherosclerosis. SKELETON: There are multiple hypermetabolic lytic osseous lesions within the axial skeleton. Within the left lateral mass of C1, there is a 2.2 cm lytic lesion involving the foramen transversarium which is significantly hypermetabolic (SUV max 93.2). There is a lytic lesion in the left aspect of the C4 vertebral body with an SUV max of 6.4. Multiple small lesions are present within the thoracolumbar spine. There are multiple hypermetabolic rib and sternal lesions, including a lower sternal lesion with an SUV max of 6.7 which is associated with an anteriorly displaced fracture, and a lesion involving the left 5th rib laterally with an SUV max of 9.4. Relatively limited involvement of the bony pelvis. Incidental CT findings: Status post L3 corpectomy and L2-4 posterior fusion. Probable T3 pathologic fracture. EXTREMITIES: There are multiple small hypermetabolic lesions within the scapula and clavicle bilaterally. In the distal right humeral shaft, there is a hypermetabolic lesion within SUV max of 3.9.  There is a possible lesion in the anterior intertrochanteric region of the left femur versus adjacent soft tissue activity. No other significant osseous lesions in the lower extremities. There is a small hypermetabolic soft tissue mass anteriorly in the subcutaneous fat of the mid right thigh. This measures 1.3 x 1.0 cm on image 239/4 and has an SUV max of 3.4. Incidental CT findings: none IMPRESSION: 1. There are multiple hypermetabolic lytic osseous lesions consistent with multiple myeloma. The most prominent lesion is within the left lateral mass of C1 and this likely involves the foramen transversarium. Probable T3 and sternal pathologic fractures. 2. No evidence of solid visceral organ involvement. 3. Small hypermetabolic subcutaneous nodule anteriorly in the mid right thigh may reflect an incidental soft tissue neoplasm. Electronically Signed   By: Richardean Sale M.D.   On: 05/24/2020 09:22   CT Biopsy  Result Date: 05/27/2020 INDICATION: Multiple myeloma please perform CT-guided bone marrow biopsy for tissue diagnostic purposes. EXAM: CT-GUIDED BONE MARROW BIOPSY AND ASPIRATION MEDICATIONS: None ANESTHESIA/SEDATION: Fentanyl 100 mcg IV; Versed 2 mg IV Sedation Time: 10 Minutes; The patient was continuously monitored during the procedure by the interventional radiology nurse under my direct supervision. COMPLICATIONS: None immediate. PROCEDURE: Informed consent was obtained from the patient following an explanation of the procedure, risks, benefits and alternatives. The patient understands, agrees and consents for the procedure. All questions were addressed. A time out was performed prior to the initiation of  the procedure. The patient was positioned prone and non-contrast localization CT was performed of the pelvis to demonstrate the iliac marrow spaces. The operative site was prepped and draped in the usual sterile fashion. Under sterile conditions and local anesthesia, a 22 gauge spinal needle was  utilized for procedural planning. Next, an 11 gauge coaxial bone biopsy needle was advanced into the left iliac marrow space. Needle position was confirmed with CT imaging. Initially, a bone marrow aspiration was performed. Next, a bone marrow biopsy was obtained with the 11 gauge outer bone marrow device. Samples were prepared with the cytotechnologist and deemed adequate. The needle was removed and superficial hemostasis was obtained with manual compression. A dressing was applied. The patient tolerated the procedure well without immediate post procedural complication. IMPRESSION: Successful CT guided left iliac bone marrow aspiration and core biopsy. Electronically Signed   By: Sandi Mariscal M.D.   On: 05/27/2020 11:31   CT BONE MARROW BIOPSY & ASPIRATION  Result Date: 05/27/2020 INDICATION: Multiple myeloma please perform CT-guided bone marrow biopsy for tissue diagnostic purposes. EXAM: CT-GUIDED BONE MARROW BIOPSY AND ASPIRATION MEDICATIONS: None ANESTHESIA/SEDATION: Fentanyl 100 mcg IV; Versed 2 mg IV Sedation Time: 10 Minutes; The patient was continuously monitored during the procedure by the interventional radiology nurse under my direct supervision. COMPLICATIONS: None immediate. PROCEDURE: Informed consent was obtained from the patient following an explanation of the procedure, risks, benefits and alternatives. The patient understands, agrees and consents for the procedure. All questions were addressed. A time out was performed prior to the initiation of the procedure. The patient was positioned prone and non-contrast localization CT was performed of the pelvis to demonstrate the iliac marrow spaces. The operative site was prepped and draped in the usual sterile fashion. Under sterile conditions and local anesthesia, a 22 gauge spinal needle was utilized for procedural planning. Next, an 11 gauge coaxial bone biopsy needle was advanced into the left iliac marrow space. Needle position was confirmed  with CT imaging. Initially, a bone marrow aspiration was performed. Next, a bone marrow biopsy was obtained with the 11 gauge outer bone marrow device. Samples were prepared with the cytotechnologist and deemed adequate. The needle was removed and superficial hemostasis was obtained with manual compression. A dressing was applied. The patient tolerated the procedure well without immediate post procedural complication. IMPRESSION: Successful CT guided left iliac bone marrow aspiration and core biopsy. Electronically Signed   By: Sandi Mariscal M.D.   On: 05/27/2020 11:31     Time Spent in minutes  30     Desiree Hane M.D on 06/07/2020 at 4:54 PM  To page go to [email or web address] - password [facility]

## 2020-06-07 NOTE — Progress Notes (Signed)
MRI unsuccessful tonight, MRI tech reported pt unable to lay flat despite IV morphine administered prior to doing down for procedure.

## 2020-06-07 NOTE — Progress Notes (Signed)
Oncology short note  Chart reviewed --patient admitted with AMS. Workup per hospitalist. Would hold all myeloma treatment unless resolved. Will f/u if still in hospital on Monday  Sullivan Lone MD MS

## 2020-06-07 NOTE — Progress Notes (Signed)
Failed attempt at MRI. Spoke with RN, pt unable to come down for 30-45 minutes. In need of morphine prior to MRI. Pt cleared by nephew Tyler Aas - [facility].

## 2020-06-07 NOTE — Progress Notes (Signed)
Second failed attempt at MRI, due to pt being unable to lay flat on MRI table after being medicated.

## 2020-06-08 DIAGNOSIS — F23 Brief psychotic disorder: Secondary | ICD-10-CM | POA: Diagnosis present

## 2020-06-08 DIAGNOSIS — M542 Cervicalgia: Secondary | ICD-10-CM | POA: Diagnosis present

## 2020-06-08 MED ORDER — OXYCODONE HCL 5 MG PO TABS
10.0000 mg | ORAL_TABLET | Freq: Four times a day (QID) | ORAL | Status: DC | PRN
  Administered 2020-06-08 – 2020-06-11 (×10): 10 mg via ORAL
  Filled 2020-06-08 (×10): qty 2

## 2020-06-08 MED ORDER — LORAZEPAM 0.5 MG PO TABS
0.5000 mg | ORAL_TABLET | Freq: Three times a day (TID) | ORAL | Status: DC
  Administered 2020-06-08 – 2020-06-09 (×3): 0.5 mg via ORAL
  Filled 2020-06-08 (×3): qty 1

## 2020-06-08 MED ORDER — FENTANYL 12 MCG/HR TD PT72
1.0000 | MEDICATED_PATCH | TRANSDERMAL | Status: DC
  Administered 2020-06-08: 16:00:00 1 via TRANSDERMAL
  Filled 2020-06-08: qty 1

## 2020-06-08 NOTE — Progress Notes (Signed)
[facility]  PROGRESS NOTE  [name] OVF:[id number] DOB: Apr 18, 1946 DOA: 06/04/2020 PCP: [name], NP Admit date - 06/04/2020   Admitting Physician Desiree Hane, MD  Outpatient Primary MD for the patient is [name], NP  LOS - 3 Brief Narrative   Lisa Lester is a 74 y.o. year old female with medical history significant for  is a 74 year old female with medical history significant for multiple myeloma (diagnosed 03/2020)currently undergoing outpatient chemotherapy who presented on 12/7 with 3 days worsening confusion, agitation and paranoid thinking and behavior witnessed by family and outpatient oncologist preventing infusion therapy.  Hospital course complicated by persistent paranoid thoughts, agitation, confusion with intermittent threats to leave [name].  Patient was placed under involuntary commitment on 12/8.  Subjective  Was unable to have MRI overnight due to persistent neck pain. A & P   Acute psychosis of unclear etiology, greatly improved.  Not endorsing any confusion, agitation, visual hallucinations or paranoia on 12/11.  Suspect could be related to Decadron received with infusion therapy (40 mg) during last infusion; however that was almost 1 week ago, still some concern could be intracranial in etiology given high concern for potential plasmacytoma on cervical spine.  CT head unremarkable, B12 TSH, ammonia, calcium, UA all unremarkable, UDS positive for opioids.  Could be some element of polypharmacy contributing to symptoms given on 11/30 patient was on fentanyl patch, increased hydrocodone and on xanax.  Nonfocal physical exam, still following commands. -Appreciate psychiatry recommendations , scheduled Haldol, closely monitor QTC -delirium precautions --Agreeable to allow brain MRI if neck pain can be better controlled -Continue one-to-one safety sitter -IVC on 12/8 given psychiatry recommends psychiatric inpatient admission  once medically cleared -We will continue to monitor mental status while improving pain regimen  Head, neck, shoulder pain ongoing for past month, consistent with her multiple myeloma. Likely bone pain.    Patient states this is typical pain she has at home and I confirmed in chart review ( in Dr. Irene Limbo note on 04/2020).  Does not seem consistent with meningismal signs given no fever, no leukocytosis, and no worsening mental status despite not receiving any antibiotics.  Flexeril was not helpful --Gabapentin listed on med rec but not taking per oncology office visit due to feeling ill  --Discussed with on-call oncologist, needs to improve pain management to allow for MRI; will add scheduled Ativan 0.5 mg 3 times daily, increase as needed oxycodone IR to 10 mg every 6, add back fentanyl patch at reduced dose of 12 mcg -Continue Tylenol as needed, discontinue tramadol   Multiple myeloma.  Receives outpatient chemotherapy -Currently holding Revlimid while inpatient -Followed by Dr. Irene Limbo as outpatient,    Family Communication  : Family updated at bedside  Code Status : Full  Disposition Plan  :  Patient is from home. Anticipated d/c date: 2 to 3 days. Barriers to d/c or necessity for inpatient status:  Still having persistent neck pain, would like to get MRI brain once neck pain better controlled requiring IV pain medications with close monitoring of mental status, seems more calm today with no overt visual hallucinations on my examination, Psychiatry recommends inpatient psychiatric admission once medically cleared.  Will require involuntary commitment given patient has threatened to leave [place] currently does not have true capacity to make that decision given acute psychosis as mentioned above, unable to formally do capacity evaluation as patient refuses to answer those questions, discussed with psychiatry who agreed to the IVC  Consults  : Psychiatry  Procedures  :    DVT  Prophylaxis  :  Lovenox   MDM: The below labs and imaging reports were reviewed and summarized above.  Medication management as above.  Lab Results  Component Value Date   PLT 239 06/04/2020    Diet :  Diet Order            Diet regular Room service appropriate? Yes; Fluid consistency: Thin  Diet effective now                  Inpatient Medications Scheduled Meds: . cyclobenzaprine  5 mg Oral TID  . enoxaparin (LOVENOX) injection  40 mg Subcutaneous Q24H  . fentaNYL  1 patch Transdermal Q72H  . haloperidol  2 mg Oral BID  . LORazepam  0.5 mg Oral TID   Continuous Infusions:  PRN Meds:.acetaminophen **OR** acetaminophen, ondansetron **OR** ondansetron (ZOFRAN) IV, oxyCODONE, polyvinyl alcohol  Antibiotics  :   Anti-infectives (From admission, onward)   None       Objective   Vitals:   06/07/20 1524 06/07/20 2057 06/08/20 0552 06/08/20 1302  BP: 113/79 (!) 142/73 (!) 147/68 137/68  Pulse: 95   92  Resp: _0 Temp: 98.9 F (37.2 C) 98.6 F (37 C) 97.9 F (36.6 C) 98.2 F (36.8 C)  TempSrc: Oral Oral Oral   SpO2: 98% 97% 99% 99%  Weight:      Height:        SpO2: 99 %  Wt Readings from Last 3 Encounters:  06/04/20 71.7 kg  06/03/20 70.3 kg  05/31/20 73 kg     Intake/Output Summary (Last 24 hours) at 06/08/2020 1553 Last data filed at 06/07/2020 1828 Gross per 24 hour  Intake 100 ml  Output --  Net 100 ml    Physical Exam:   Awake, alert to place and self, In discomfort but no acute distress, neck pain not reproducible by palpation on examination, limited neck range of motion Calm today. No overt agitation today. Reports still struggling to remember things Normal respiratory effort on room air No peripheral edema Following commands, no appreciable focal deficit, no slurring, no facial droop or dysarthria   I have personally reviewed the following:   Data Reviewed:  CBC Recent Labs  Lab 06/03/20 1035 06/04/20 1234  WBC  5.3 5.7  HGB 12.7 13.0  HCT 38.1 38.7  PLT 229 239  MCV 90.5 91.9  MCH 30.2 30.9  MCHC 33.3 33.6  RDW 13.7 13.8  LYMPHSABS 0.5* 0.7  MONOABS 0.5 0.6  EOSABS 0.0 0.1  BASOSABS 0.1 0.1    Chemistries  Recent Labs  Lab 06/03/20 1035 06/04/20 1234  NA 139 134*  K 4.6 3.5  CL 103 100  CO2 27 26  GLUCOSE 127* 106*  BUN 6* 5*  CREATININE 0.92 0.87  CALCIUM 9.2 8.9  AST 12* 16  ALT 15 18  ALKPHOS 89 87  BILITOT 0.3 0.7   ------------------------------------------------------------------------------------------------------------------ No results for input(s): CHOL, HDL, LDLCALC, TRIG, CHOLHDL, LDLDIRECT in the last 72 hours.  No results found for: HGBA1C ------------------------------------------------------------------------------------------------------------------ No results for input(s): TSH, T4TOTAL, T3FREE, THYROIDAB in the last 72 hours.  Invalid input(s): FREET3 ------------------------------------------------------------------------------------------------------------------ No results for input(s): VITAMINB12, FOLATE, FERRITIN, TIBC, IRON, RETICCTPCT in the last 72 hours.  Coagulation profile No results for input(s): INR, PROTIME in the last 168 hours.  No results for input(s): DDIMER in the last 72 hours.  Cardiac Enzymes No results for  input(s): CKMB, TROPONINI, MYOGLOBIN in the last 168 hours.  Invalid input(s): CK ------------------------------------------------------------------------------------------------------------------ No results found for: BNP  Micro Results Recent Results (from the past 240 hour(s))  Resp Panel by RT-PCR (Flu A&B, Covid) Nasopharyngeal Swab     Status: None   Collection Time: 06/04/20  5:47 PM   Specimen: Nasopharyngeal Swab; Nasopharyngeal(NP) swabs in vial transport medium  Result Value Ref Range Status   SARS Coronavirus 2 by RT PCR NEGATIVE NEGATIVE Final    Comment: (NOTE) SARS-CoV-2 target nucleic acids are NOT  DETECTED.  The SARS-CoV-2 RNA is generally detectable in upper respiratory specimens during the acute phase of infection. The lowest concentration of SARS-CoV-2 viral copies this assay can detect is 138 copies/mL. A negative result does not preclude SARS-Cov-2 infection and should not be used as the sole basis for treatment or other patient management decisions. A negative result may occur with  improper specimen collection/handling, submission of specimen other than nasopharyngeal swab, presence of viral mutation(s) within the areas targeted by this assay, and inadequate number of viral copies(<138 copies/mL). A negative result must be combined with clinical observations, patient history, and epidemiological information. The expected result is Negative.  Fact Sheet for Patients:  EntrepreneurPulse.com.au  Fact Sheet for Healthcare Providers:  IncredibleEmployment.be  This test is no t yet approved or cleared by the [place] FDA and  has been authorized for detection and/or diagnosis of SARS-CoV-2 by FDA under an Emergency Use Authorization (EUA). This EUA will remain  in effect (meaning this test can be used) for the duration of the COVID-19 declaration under Section 564(b)(1) of the Act, 21 U.S.C.section 360bbb-3(b)(1), unless the authorization is terminated  or revoked sooner.       Influenza A by PCR NEGATIVE NEGATIVE Final   Influenza B by PCR NEGATIVE NEGATIVE Final    Comment: (NOTE) The Xpert Xpress SARS-CoV-2/FLU/RSV plus assay is intended as an aid in the diagnosis of influenza from Nasopharyngeal swab specimens and should not be used as a sole basis for treatment. Nasal washings and aspirates are unacceptable for Xpert Xpress SARS-CoV-2/FLU/RSV testing.  Fact Sheet for Patients: EntrepreneurPulse.com.au  Fact Sheet for Healthcare Providers: IncredibleEmployment.be  This test is not yet  approved or cleared by the [place] FDA and has been authorized for detection and/or diagnosis of SARS-CoV-2 by FDA under an Emergency Use Authorization (EUA). This EUA will remain in effect (meaning this test can be used) for the duration of the COVID-19 declaration under Section 564(b)(1) of the Act, 21 U.S.C. section 360bbb-3(b)(1), unless the authorization is terminated or revoked.  Performed at [facility], [place] [address] [place], [place] [address]     Radiology Reports CT HEAD WO CONTRAST  Result Date: 06/04/2020 CLINICAL DATA:  Delirium, increased confusion for 2 days. Hallucinations. EXAM: CT HEAD WITHOUT CONTRAST TECHNIQUE: Contiguous axial images were obtained from the base of the skull through the vertex without intravenous contrast. COMPARISON:  None. FINDINGS: Brain: No evidence of large-territorial acute infarction. No parenchymal hemorrhage. No mass lesion. No extra-axial collection. No mass effect or midline shift. No hydrocephalus. Basilar cisterns are patent. Vascular: No hyperdense vessel. Skull: No acute fracture or focal lesion. Sinuses/Orbits: Paranasal sinuses and mastoid air cells are clear. The orbits are unremarkable. Other: None. IMPRESSION: No acute intracranial abnormality. Electronically Signed   By: Iven Finn M.D.   On: 06/04/2020 15:57   NM PET Image Initial (PI) Whole Body  Result Date: 05/24/2020 CLINICAL DATA:  Initial treatment strategy for multiple  myeloma. EXAM: NUCLEAR MEDICINE PET WHOLE BODY TECHNIQUE: 7.8 mCi F-18 FDG was injected intravenously. Full-ring PET imaging was performed from the head to foot after the radiotracer. CT data was obtained and used for attenuation correction and anatomic localization. Fasting blood glucose: 110 mg/dl COMPARISON:  None. FINDINGS: Mediastinal blood pool activity: SUV max 2.9 HEAD/ NECK: No hypermetabolic cervical lymph nodes are identified.There are no lesions of the pharyngeal mucosal  space. Incidental CT findings: none CHEST: There are no hypermetabolic mediastinal, hilar or axillary lymph nodes. No hypermetabolic pulmonary activity or suspicious pulmonary nodularity. Incidental CT findings: Mild dependent atelectasis or scarring in both lungs. Mild atherosclerosis of the aorta, great vessels and coronary arteries. Bilateral breast implants are noted. ABDOMEN/PELVIS: There is no hypermetabolic activity within the liver, adrenal glands, spleen or pancreas. There is no hypermetabolic nodal activity. Incidental CT findings: Central 1.9 cm cyst in the right hepatic lobe. Mild aortic and branch vessel atherosclerosis. SKELETON: There are multiple hypermetabolic lytic osseous lesions within the axial skeleton. Within the left lateral mass of C1, there is a 2.2 cm lytic lesion involving the foramen transversarium which is significantly hypermetabolic (SUV max 52.8). There is a lytic lesion in the left aspect of the C4 vertebral body with an SUV max of 6.4. Multiple small lesions are present within the thoracolumbar spine. There are multiple hypermetabolic rib and sternal lesions, including a lower sternal lesion with an SUV max of 6.7 which is associated with an anteriorly displaced fracture, and a lesion involving the left 5th rib laterally with an SUV max of 9.4. Relatively limited involvement of the bony pelvis. Incidental CT findings: Status post L3 corpectomy and L2-4 posterior fusion. Probable T3 pathologic fracture. EXTREMITIES: There are multiple small hypermetabolic lesions within the scapula and clavicle bilaterally. In the distal right humeral shaft, there is a hypermetabolic lesion within SUV max of 3.9. There is a possible lesion in the anterior intertrochanteric region of the left femur versus adjacent soft tissue activity. No other significant osseous lesions in the lower extremities. There is a small hypermetabolic soft tissue mass anteriorly in the subcutaneous fat of the mid right  thigh. This measures 1.3 x 1.0 cm on image 239/4 and has an SUV max of 3.4. Incidental CT findings: none IMPRESSION: 1. There are multiple hypermetabolic lytic osseous lesions consistent with multiple myeloma. The most prominent lesion is within the left lateral mass of C1 and this likely involves the foramen transversarium. Probable T3 and sternal pathologic fractures. 2. No evidence of solid visceral organ involvement. 3. Small hypermetabolic subcutaneous nodule anteriorly in the mid right thigh may reflect an incidental soft tissue neoplasm. Electronically Signed   By: Richardean Sale M.D.   On: 05/24/2020 09:22   CT Biopsy  Result Date: 05/27/2020 INDICATION: Multiple myeloma please perform CT-guided bone marrow biopsy for tissue diagnostic purposes. EXAM: CT-GUIDED BONE MARROW BIOPSY AND ASPIRATION MEDICATIONS: None ANESTHESIA/SEDATION: Fentanyl 100 mcg IV; Versed 2 mg IV Sedation Time: 10 Minutes; The patient was continuously monitored during the procedure by the interventional radiology nurse under my direct supervision. COMPLICATIONS: None immediate. PROCEDURE: Informed consent was obtained from the patient following an explanation of the procedure, risks, benefits and alternatives. The patient understands, agrees and consents for the procedure. All questions were addressed. A time out was performed prior to the initiation of the procedure. The patient was positioned prone and non-contrast localization CT was performed of the pelvis to demonstrate the iliac marrow spaces. The operative site was prepped and draped in the  usual sterile fashion. Under sterile conditions and local anesthesia, a 22 gauge spinal needle was utilized for procedural planning. Next, an 11 gauge coaxial bone biopsy needle was advanced into the left iliac marrow space. Needle position was confirmed with CT imaging. Initially, a bone marrow aspiration was performed. Next, a bone marrow biopsy was obtained with the 11 gauge outer  bone marrow device. Samples were prepared with the cytotechnologist and deemed adequate. The needle was removed and superficial hemostasis was obtained with manual compression. A dressing was applied. The patient tolerated the procedure well without immediate post procedural complication. IMPRESSION: Successful CT guided left iliac bone marrow aspiration and core biopsy. Electronically Signed   By: Sandi Mariscal M.D.   On: 05/27/2020 11:31   CT BONE MARROW BIOPSY & ASPIRATION  Result Date: 05/27/2020 INDICATION: Multiple myeloma please perform CT-guided bone marrow biopsy for tissue diagnostic purposes. EXAM: CT-GUIDED BONE MARROW BIOPSY AND ASPIRATION MEDICATIONS: None ANESTHESIA/SEDATION: Fentanyl 100 mcg IV; Versed 2 mg IV Sedation Time: 10 Minutes; The patient was continuously monitored during the procedure by the interventional radiology nurse under my direct supervision. COMPLICATIONS: None immediate. PROCEDURE: Informed consent was obtained from the patient following an explanation of the procedure, risks, benefits and alternatives. The patient understands, agrees and consents for the procedure. All questions were addressed. A time out was performed prior to the initiation of the procedure. The patient was positioned prone and non-contrast localization CT was performed of the pelvis to demonstrate the iliac marrow spaces. The operative site was prepped and draped in the usual sterile fashion. Under sterile conditions and local anesthesia, a 22 gauge spinal needle was utilized for procedural planning. Next, an 11 gauge coaxial bone biopsy needle was advanced into the left iliac marrow space. Needle position was confirmed with CT imaging. Initially, a bone marrow aspiration was performed. Next, a bone marrow biopsy was obtained with the 11 gauge outer bone marrow device. Samples were prepared with the cytotechnologist and deemed adequate. The needle was removed and superficial hemostasis was obtained with  manual compression. A dressing was applied. The patient tolerated the procedure well without immediate post procedural complication. IMPRESSION: Successful CT guided left iliac bone marrow aspiration and core biopsy. Electronically Signed   By: Sandi Mariscal M.D.   On: 05/27/2020 11:31     Time Spent in minutes  30     Desiree Hane M.D on 06/08/2020 at 3:53 PM  To page go to [email or web address] - password [email or web address]

## 2020-06-09 ENCOUNTER — Other Ambulatory Visit: Payer: Self-pay | Admitting: Hematology

## 2020-06-09 LAB — CBC
HCT: 38.3 % (ref 36.0–46.0)
Hemoglobin: 12.6 g/dL (ref 12.0–15.0)
MCH: 30.9 pg (ref 26.0–34.0)
MCHC: 32.9 g/dL (ref 30.0–36.0)
MCV: 93.9 fL (ref 80.0–100.0)
Platelets: 243 10*3/uL (ref 150–400)
RBC: 4.08 MIL/uL (ref 3.87–5.11)
RDW: 13.9 % (ref 11.5–15.5)
WBC: 7.3 10*3/uL (ref 4.0–10.5)
nRBC: 0 % (ref 0.0–0.2)

## 2020-06-09 LAB — BASIC METABOLIC PANEL
Anion gap: 11 (ref 5–15)
BUN: 6 mg/dL — ABNORMAL LOW (ref 8–23)
CO2: 26 mmol/L (ref 22–32)
Calcium: 7.9 mg/dL — ABNORMAL LOW (ref 8.9–10.3)
Chloride: 99 mmol/L (ref 98–111)
Creatinine, Ser: 0.78 mg/dL (ref 0.44–1.00)
GFR, Estimated: >60 mL/min (ref >60)
Glucose, Bld: 128 mg/dL — ABNORMAL HIGH (ref 70–99)
Potassium: 3 mmol/L — ABNORMAL LOW (ref 3.5–5.1)
Sodium: 136 mmol/L (ref 135–145)

## 2020-06-09 LAB — MAGNESIUM: Magnesium: 2 mg/dL (ref 1.7–2.4)

## 2020-06-09 MED ORDER — POTASSIUM CHLORIDE 20 MEQ PO PACK
40.0000 meq | PACK | Freq: Once | ORAL | Status: AC
  Administered 2020-06-09: 21:00:00 40 meq via ORAL
  Filled 2020-06-09: qty 2

## 2020-06-09 MED ORDER — LORAZEPAM 1 MG PO TABS
1.0000 mg | ORAL_TABLET | Freq: Three times a day (TID) | ORAL | Status: DC
  Administered 2020-06-09 – 2020-06-12 (×10): 1 mg via ORAL
  Filled 2020-06-09 (×10): qty 1

## 2020-06-09 MED ORDER — LORAZEPAM 0.5 MG PO TABS
0.5000 mg | ORAL_TABLET | Freq: Once | ORAL | Status: AC
  Administered 2020-06-09: 13:00:00 0.5 mg via ORAL
  Filled 2020-06-09: qty 1

## 2020-06-09 NOTE — Progress Notes (Signed)
Pt medication regime changed. Additional dose of ativan given. Cont with plan of care

## 2020-06-09 NOTE — Progress Notes (Signed)
[facility]  PROGRESS NOTE  [name]:[id number] DOB: [birth year] DOA: 06/04/2020 PCP: [name], NP Admit date - 06/04/2020   Admitting Physician Desiree Hane, MD  Outpatient Primary MD for the patient is [name], NP  LOS - 4 Brief Narrative   Lisa Lester is a 74 y.o. year old female with medical history significant for  is a 74 year old female with medical history significant for multiple myeloma (diagnosed 03/2020)currently undergoing outpatient chemotherapy who presented on 12/7 with 3 days worsening confusion, agitation and paranoid thinking and behavior witnessed by family and outpatient oncologist preventing infusion therapy.  Hospital course complicated by persistent paranoid thoughts, agitation, confusion with intermittent threats to leave [place].  Patient was placed under involuntary commitment on 12/8.  Subjective  Neck pain seem to be better controlled earlier this morning, but now again having persistent neck pain.  Too much to be able to undergo MRI today A & P   Acute psychosis of unclear etiology, greatly improved.  Not endorsing any confusion, agitation, visual hallucinations or paranoia on 12/11.  Suspect could be related to Decadron received with infusion therapy (40 mg) during last infusion; however that was almost 1 week ago, still some concern could be intracranial in etiology given high concern for potential plasmacytoma on cervical spine.  CT head unremarkable, B12 TSH, ammonia, calcium, UA all unremarkable, UDS positive for opioids.  Could be some element of polypharmacy contributing to symptoms given on 11/30 patient was on fentanyl patch, increased hydrocodone and on xanax.  Nonfocal physical exam, still following commands. -Appreciate psychiatry recommendations , scheduled Haldol, closely monitor QTC -delirium precautions --Agreeable to allow brain MRI if neck pain can be better controlled, suspect may need sedation  for MRI brain which would warrant transfer to [place] one-to-one safety sitter -IVC on 12/8 given psychiatry recommends psychiatric inpatient admission once medically cleared -We will continue to monitor mental status while improving pain regimen  Head, neck, shoulder pain ongoing for past month, consistent with her multiple myeloma. Likely bone pain.    Patient states this is typical pain she has at home and I confirmed in chart review ( in Dr. Irene Limbo note on 04/2020).  Does not seem consistent with meningismal signs given no fever, no leukocytosis, and no worsening mental status despite not receiving any antibiotics.  Flexeril was not helpful --Gabapentin listed on med rec but not taking per oncology office visit due to feeling ill  --Discussed with on-call oncologist, needs to improve pain management to allow for MRI;  scheduled Ativan increase to1 mg 3 times daily, increase as needed oxycodone IR to 10 mg every 6, add back fentanyl patch at reduced dose of 12 mcg -Continue Tylenol as needed, discontinue tramadol -Neurochecks   Multiple myeloma.  Receives outpatient chemotherapy -Currently holding Revlimid while inpatient -Followed by Dr. Irene Limbo as outpatient,    Family Communication  : Family updated at bedside  Code Status : Full  Disposition Plan  :  Patient is from home. Anticipated d/c date: 2 to 3 days. Barriers to d/c or necessity for inpatient status:  Still having persistent neck pain, would like to get MRI brain once neck pain better controlled requiring IV pain medications with close monitoring of mental status--- may warrant MRI brain with sedation, seems more calm today with no overt visual hallucinations on my examination but still quite agitated and somewhat paranoid, Psychiatry recommends inpatient psychiatric admission once medically cleared.  Will require involuntary commitment given patient  has threatened to leave [place] currently does not have true  capacity to make that decision given acute psychosis as mentioned above, unable to formally do capacity evaluation as patient refuses to answer those questions, discussed with psychiatry who agreed to the IVC Consults  : Psychiatry  Procedures  :    DVT Prophylaxis  :  Lovenox   MDM: The below labs and imaging reports were reviewed and summarized above.  Medication management as above.  Lab Results  Component Value Date   PLT 243 06/09/2020    Diet :  Diet Order            Diet regular Room service appropriate? Yes; Fluid consistency: Thin  Diet effective now                  Inpatient Medications Scheduled Meds: . cyclobenzaprine  5 mg Oral TID  . enoxaparin (LOVENOX) injection  40 mg Subcutaneous Q24H  . fentaNYL  1 patch Transdermal Q72H  . haloperidol  2 mg Oral BID  . LORazepam  1 mg Oral TID   Continuous Infusions:  PRN Meds:.acetaminophen **OR** acetaminophen, ondansetron **OR** ondansetron (ZOFRAN) IV, oxyCODONE, polyvinyl alcohol  Antibiotics  :   Anti-infectives (From admission, onward)   None       Objective   Vitals:   06/08/20 1302 06/08/20 2112 06/09/20 0533 06/09/20 1310  BP: 137/68 133/69 131/64 (!) 137/58  Pulse: 92 100 100 (!) 110  Resp: _0 Temp: 98.2 F (36.8 C) 99.1 F (37.3 C) 99.1 F (37.3 C) 98.5 F (36.9 C)  TempSrc:  Oral Oral   SpO2: 99% 96% 96% 97%  Weight:      Height:        SpO2: 97 %  Wt Readings from Last 3 Encounters:  06/04/20 71.7 kg  06/03/20 70.3 kg  05/31/20 73 kg     Intake/Output Summary (Last 24 hours) at 06/09/2020 1705 Last data filed at 06/09/2020 1300 Gross per 24 hour  Intake 1440 ml  Output --  Net 1440 ml    Physical Exam:   Awake, alert to place and self, In discomfort but no acute distress, neck pain not reproducible by palpation on examination, limited neck range of motion Calm today.  Slightly agitated, paranoid about pain medication reports still struggling to remember  things Normal respiratory effort on room air No peripheral edema Following commands, no appreciable focal deficit, no slurring, no facial droop or dysarthria   I have personally reviewed the following:   Data Reviewed:  CBC Recent Labs  Lab 06/03/20 1035 06/04/20 1234 06/09/20 0816  WBC 5.3 5.7 7.3  HGB 12.7 13.0 12.6  HCT 38.1 38.7 38.3  PLT 229 239 243  MCV 90.5 91.9 93.9  MCH 30.2 30.9 30.9  MCHC 33.3 33.6 32.9  RDW 13.7 13.8 13.9  LYMPHSABS 0.5* 0.7  --   MONOABS 0.5 0.6  --   EOSABS 0.0 0.1  --   BASOSABS 0.1 0.1  --     Chemistries  Recent Labs  Lab 06/03/20 1035 06/04/20 1234 06/09/20 0816  NA 139 134* 136  K 4.6 3.5 3.0*  CL 103 100 99  CO2 _1 GLUCOSE 127* 106* 128*  BUN 6* 5* 6*  CREATININE 0.92 0.87 0.78  CALCIUM 9.2 8.9 7.9*  AST 12* 16  --   ALT 15 18  --   ALKPHOS 89 87  --   BILITOT 0.3 0.7  --    ------------------------------------------------------------------------------------------------------------------  No results for input(s): CHOL, HDL, LDLCALC, TRIG, CHOLHDL, LDLDIRECT in the last 72 hours.  No results found for: HGBA1C ------------------------------------------------------------------------------------------------------------------ No results for input(s): TSH, T4TOTAL, T3FREE, THYROIDAB in the last 72 hours.  Invalid input(s): FREET3 ------------------------------------------------------------------------------------------------------------------ No results for input(s): VITAMINB12, FOLATE, FERRITIN, TIBC, IRON, RETICCTPCT in the last 72 hours.  Coagulation profile No results for input(s): INR, PROTIME in the last 168 hours.  No results for input(s): DDIMER in the last 72 hours.  Cardiac Enzymes No results for input(s): CKMB, TROPONINI, MYOGLOBIN in the last 168 hours.  Invalid input(s): CK ------------------------------------------------------------------------------------------------------------------ No results  found for: BNP  Micro Results Recent Results (from the past 240 hour(s))  Resp Panel by RT-PCR (Flu A&B, Covid) Nasopharyngeal Swab     Status: None   Collection Time: 06/04/20  5:47 PM   Specimen: Nasopharyngeal Swab; Nasopharyngeal(NP) swabs in vial transport medium  Result Value Ref Range Status   SARS Coronavirus 2 by RT PCR NEGATIVE NEGATIVE Final    Comment: (NOTE) SARS-CoV-2 target nucleic acids are NOT DETECTED.  The SARS-CoV-2 RNA is generally detectable in upper respiratory specimens during the acute phase of infection. The lowest concentration of SARS-CoV-2 viral copies this assay can detect is 138 copies/mL. A negative result does not preclude SARS-Cov-2 infection and should not be used as the sole basis for treatment or other patient management decisions. A negative result may occur with  improper specimen collection/handling, submission of specimen other than nasopharyngeal swab, presence of viral mutation(s) within the areas targeted by this assay, and inadequate number of viral copies(<138 copies/mL). A negative result must be combined with clinical observations, patient history, and epidemiological information. The expected result is Negative.  Fact Sheet for Patients:  EntrepreneurPulse.com.au  Fact Sheet for Healthcare Providers:  IncredibleEmployment.be  This test is no t yet approved or cleared by the [place] FDA and  has been authorized for detection and/or diagnosis of SARS-CoV-2 by FDA under an Emergency Use Authorization (EUA). This EUA will remain  in effect (meaning this test can be used) for the duration of the COVID-19 declaration under Section 564(b)(1) of the Act, 21 U.S.C.section 360bbb-3(b)(1), unless the authorization is terminated  or revoked sooner.       Influenza A by PCR NEGATIVE NEGATIVE Final   Influenza B by PCR NEGATIVE NEGATIVE Final    Comment: (NOTE) The Xpert Xpress SARS-CoV-2/FLU/RSV  plus assay is intended as an aid in the diagnosis of influenza from Nasopharyngeal swab specimens and should not be used as a sole basis for treatment. Nasal washings and aspirates are unacceptable for Xpert Xpress SARS-CoV-2/FLU/RSV testing.  Fact Sheet for Patients: EntrepreneurPulse.com.au  Fact Sheet for Healthcare Providers: IncredibleEmployment.be  This test is not yet approved or cleared by the [place] FDA and has been authorized for detection and/or diagnosis of SARS-CoV-2 by FDA under an Emergency Use Authorization (EUA). This EUA will remain in effect (meaning this test can be used) for the duration of the COVID-19 declaration under Section 564(b)(1) of the Act, 21 U.S.C. section 360bbb-3(b)(1), unless the authorization is terminated or revoked.  Performed at [facility], [place] [address] [place], [place] [address]     Radiology Reports CT HEAD WO CONTRAST  Result Date: 06/04/2020 CLINICAL DATA:  Delirium, increased confusion for 2 days. Hallucinations. EXAM: CT HEAD WITHOUT CONTRAST TECHNIQUE: Contiguous axial images were obtained from the base of the skull through the vertex without intravenous contrast. COMPARISON:  None. FINDINGS: Brain: No evidence of large-territorial acute infarction. No  parenchymal hemorrhage. No mass lesion. No extra-axial collection. No mass effect or midline shift. No hydrocephalus. Basilar cisterns are patent. Vascular: No hyperdense vessel. Skull: No acute fracture or focal lesion. Sinuses/Orbits: Paranasal sinuses and mastoid air cells are clear. The orbits are unremarkable. Other: None. IMPRESSION: No acute intracranial abnormality. Electronically Signed   By: Iven Finn M.D.   On: 06/04/2020 15:57   NM PET Image Initial (PI) Whole Body  Result Date: 05/24/2020 CLINICAL DATA:  Initial treatment strategy for multiple myeloma. EXAM: NUCLEAR MEDICINE PET WHOLE BODY TECHNIQUE: 7.8  mCi F-18 FDG was injected intravenously. Full-ring PET imaging was performed from the head to foot after the radiotracer. CT data was obtained and used for attenuation correction and anatomic localization. Fasting blood glucose: 110 mg/dl COMPARISON:  None. FINDINGS: Mediastinal blood pool activity: SUV max 2.9 HEAD/ NECK: No hypermetabolic cervical lymph nodes are identified.There are no lesions of the pharyngeal mucosal space. Incidental CT findings: none CHEST: There are no hypermetabolic mediastinal, hilar or axillary lymph nodes. No hypermetabolic pulmonary activity or suspicious pulmonary nodularity. Incidental CT findings: Mild dependent atelectasis or scarring in both lungs. Mild atherosclerosis of the aorta, great vessels and coronary arteries. Bilateral breast implants are noted. ABDOMEN/PELVIS: There is no hypermetabolic activity within the liver, adrenal glands, spleen or pancreas. There is no hypermetabolic nodal activity. Incidental CT findings: Central 1.9 cm cyst in the right hepatic lobe. Mild aortic and branch vessel atherosclerosis. SKELETON: There are multiple hypermetabolic lytic osseous lesions within the axial skeleton. Within the left lateral mass of C1, there is a 2.2 cm lytic lesion involving the foramen transversarium which is significantly hypermetabolic (SUV max 62.3). There is a lytic lesion in the left aspect of the C4 vertebral body with an SUV max of 6.4. Multiple small lesions are present within the thoracolumbar spine. There are multiple hypermetabolic rib and sternal lesions, including a lower sternal lesion with an SUV max of 6.7 which is associated with an anteriorly displaced fracture, and a lesion involving the left 5th rib laterally with an SUV max of 9.4. Relatively limited involvement of the bony pelvis. Incidental CT findings: Status post L3 corpectomy and L2-4 posterior fusion. Probable T3 pathologic fracture. EXTREMITIES: There are multiple small hypermetabolic lesions  within the scapula and clavicle bilaterally. In the distal right humeral shaft, there is a hypermetabolic lesion within SUV max of 3.9. There is a possible lesion in the anterior intertrochanteric region of the left femur versus adjacent soft tissue activity. No other significant osseous lesions in the lower extremities. There is a small hypermetabolic soft tissue mass anteriorly in the subcutaneous fat of the mid right thigh. This measures 1.3 x 1.0 cm on image 239/4 and has an SUV max of 3.4. Incidental CT findings: none IMPRESSION: 1. There are multiple hypermetabolic lytic osseous lesions consistent with multiple myeloma. The most prominent lesion is within the left lateral mass of C1 and this likely involves the foramen transversarium. Probable T3 and sternal pathologic fractures. 2. No evidence of solid visceral organ involvement. 3. Small hypermetabolic subcutaneous nodule anteriorly in the mid right thigh may reflect an incidental soft tissue neoplasm. Electronically Signed   By: Richardean Sale M.D.   On: 05/24/2020 09:22   CT Biopsy  Result Date: 05/27/2020 INDICATION: Multiple myeloma please perform CT-guided bone marrow biopsy for tissue diagnostic purposes. EXAM: CT-GUIDED BONE MARROW BIOPSY AND ASPIRATION MEDICATIONS: None ANESTHESIA/SEDATION: Fentanyl 100 mcg IV; Versed 2 mg IV Sedation Time: 10 Minutes; The patient was continuously monitored during  the procedure by the interventional radiology nurse under my direct supervision. COMPLICATIONS: None immediate. PROCEDURE: Informed consent was obtained from the patient following an explanation of the procedure, risks, benefits and alternatives. The patient understands, agrees and consents for the procedure. All questions were addressed. A time out was performed prior to the initiation of the procedure. The patient was positioned prone and non-contrast localization CT was performed of the pelvis to demonstrate the iliac marrow spaces. The operative  site was prepped and draped in the usual sterile fashion. Under sterile conditions and local anesthesia, a 22 gauge spinal needle was utilized for procedural planning. Next, an 11 gauge coaxial bone biopsy needle was advanced into the left iliac marrow space. Needle position was confirmed with CT imaging. Initially, a bone marrow aspiration was performed. Next, a bone marrow biopsy was obtained with the 11 gauge outer bone marrow device. Samples were prepared with the cytotechnologist and deemed adequate. The needle was removed and superficial hemostasis was obtained with manual compression. A dressing was applied. The patient tolerated the procedure well without immediate post procedural complication. IMPRESSION: Successful CT guided left iliac bone marrow aspiration and core biopsy. Electronically Signed   By: Sandi Mariscal M.D.   On: 05/27/2020 11:31   CT BONE MARROW BIOPSY & ASPIRATION  Result Date: 05/27/2020 INDICATION: Multiple myeloma please perform CT-guided bone marrow biopsy for tissue diagnostic purposes. EXAM: CT-GUIDED BONE MARROW BIOPSY AND ASPIRATION MEDICATIONS: None ANESTHESIA/SEDATION: Fentanyl 100 mcg IV; Versed 2 mg IV Sedation Time: 10 Minutes; The patient was continuously monitored during the procedure by the interventional radiology nurse under my direct supervision. COMPLICATIONS: None immediate. PROCEDURE: Informed consent was obtained from the patient following an explanation of the procedure, risks, benefits and alternatives. The patient understands, agrees and consents for the procedure. All questions were addressed. A time out was performed prior to the initiation of the procedure. The patient was positioned prone and non-contrast localization CT was performed of the pelvis to demonstrate the iliac marrow spaces. The operative site was prepped and draped in the usual sterile fashion. Under sterile conditions and local anesthesia, a 22 gauge spinal needle was utilized for procedural  planning. Next, an 11 gauge coaxial bone biopsy needle was advanced into the left iliac marrow space. Needle position was confirmed with CT imaging. Initially, a bone marrow aspiration was performed. Next, a bone marrow biopsy was obtained with the 11 gauge outer bone marrow device. Samples were prepared with the cytotechnologist and deemed adequate. The needle was removed and superficial hemostasis was obtained with manual compression. A dressing was applied. The patient tolerated the procedure well without immediate post procedural complication. IMPRESSION: Successful CT guided left iliac bone marrow aspiration and core biopsy. Electronically Signed   By: Sandi Mariscal M.D.   On: 05/27/2020 11:31     Time Spent in minutes  30     Desiree Hane M.D on 06/09/2020 at 5:05 PM  To page go to [email or web address] - password [facility]

## 2020-06-09 NOTE — Progress Notes (Signed)
Pt is upset about her pain medication regime. She requesting pain med more frequently than its ordered..This RN informed pt that pain medication is every 6 hours and she last had oxy at 0338. Offered her tylenol and she declined. Stating that the Dr told her she can have it whenever she wants it. She got her scheduled medications of Flexeril 5 mg of which she told me she was to have 4 flexeril. Ativan she said she get 2. Gave daily medication at this time.   Continue with current plan of care .

## 2020-06-09 NOTE — Progress Notes (Signed)
Pt is upset with pain medication regime. Stating that I told her she could have it at 930. Explained that I was with another pt. Pt not receptive. Assessed pain. Pt reports a paine level of 10 across the back of her head. Explained pain regime again. Oxy will be given. She requested another Duragesic patch. Explained that she just had one applied yesterday. Cont with current plan of care

## 2020-06-10 ENCOUNTER — Other Ambulatory Visit: Payer: Self-pay | Admitting: Hematology

## 2020-06-10 DIAGNOSIS — C9000 Multiple myeloma not having achieved remission: Secondary | ICD-10-CM

## 2020-06-10 DIAGNOSIS — R410 Disorientation, unspecified: Secondary | ICD-10-CM

## 2020-06-10 DIAGNOSIS — M542 Cervicalgia: Secondary | ICD-10-CM

## 2020-06-10 LAB — BASIC METABOLIC PANEL
Anion gap: 9 (ref 5–15)
BUN: 8 mg/dL (ref 8–23)
CO2: 25 mmol/L (ref 22–32)
Calcium: 7.7 mg/dL — ABNORMAL LOW (ref 8.9–10.3)
Chloride: 102 mmol/L (ref 98–111)
Creatinine, Ser: 0.67 mg/dL (ref 0.44–1.00)
GFR, Estimated: >60 mL/min (ref >60)
Glucose, Bld: 110 mg/dL — ABNORMAL HIGH (ref 70–99)
Potassium: 3.2 mmol/L — ABNORMAL LOW (ref 3.5–5.1)
Sodium: 136 mmol/L (ref 135–145)

## 2020-06-10 MED ORDER — MORPHINE SULFATE (PF) 2 MG/ML IV SOLN
1.0000 mg | Freq: Once | INTRAVENOUS | Status: AC
Start: 2020-06-10 — End: 2020-06-10
  Administered 2020-06-10: 14:00:00 1 mg via INTRAVENOUS
  Filled 2020-06-10: qty 1

## 2020-06-10 MED ORDER — POTASSIUM CHLORIDE 20 MEQ PO PACK
40.0000 meq | PACK | Freq: Once | ORAL | Status: AC
  Administered 2020-06-10: 09:00:00 40 meq via ORAL
  Filled 2020-06-10: qty 2

## 2020-06-10 MED ORDER — FENTANYL 25 MCG/HR TD PT72
1.0000 | MEDICATED_PATCH | TRANSDERMAL | Status: DC
  Administered 2020-06-10 – 2020-06-16 (×3): 1 via TRANSDERMAL
  Filled 2020-06-10 (×3): qty 1

## 2020-06-10 NOTE — Plan of Care (Signed)
  Problem: Education: Goal: Knowledge of General Education information will improve Description: Including pain rating scale, medication(s)/side effects and non-pharmacologic comfort measures Outcome: Completed/Met   Problem: Coping: Goal: Level of anxiety will decrease Outcome: Completed/Met

## 2020-06-10 NOTE — Progress Notes (Addendum)
HEMATOLOGY-ONCOLOGY PROGRESS NOTE  SUBJECTIVE: Resting in bed.  Sitter at the bedside.  Patient reports having pain to the base of her skull which radiates down her neck.  Rates pain 10/10.  Pain does not radiate down her arms.  Has some difficulty moving her head to the sides and looking up.  States that she does not have any other pain other than the pain.  She denies chest pain, shortness of breath, cough, abdominal pain, nausea, vomiting.  She reports that her confusion is improved.  Nursing confirms this.  Oncology History  Multiple myeloma not having achieved remission ([place])  05/13/2020 Initial Diagnosis   Multiple myeloma not having achieved remission ([place])   05/28/2020 -  Chemotherapy   The patient had dexamethasone (DECADRON) tablet 20 mg, 20 mg, Oral,  Once, 1 of 6 cycles Administration: 20 mg (05/28/2020), 20 mg (05/31/2020) dexamethasone (DECADRON) 4 MG tablet, 1 of 1 cycle, Start date: 05/13/2020, End date: 06/04/2020 lenalidomide (REVLIMID) 15 MG capsule, 15 mg, Oral, Daily, 1 of 1 cycle, Start date: 05/15/2020, End date: 06/04/2020 bortezomib SQ (VELCADE) chemo injection (2.59m/mL concentration) 2.5 mg, 1.3 mg/m2 = 2.5 mg, Subcutaneous,  Once, 1 of 6 cycles Administration: 2.5 mg (05/28/2020), 2.5 mg (05/31/2020)  for chemotherapy treatment.       REVIEW OF SYSTEMS:   Constitutional: Denies fevers, chills  Eyes: Denies blurriness of vision Ears, nose, mouth, throat, and face: Denies mucositis or sore throat Respiratory: Denies cough, dyspnea or wheezes Cardiovascular: Denies palpitation, chest discomfort Gastrointestinal:  Denies nausea, heartburn or change in bowel habits Skin: Denies abnormal skin rashes Lymphatics: Denies new lymphadenopathy or easy bruising Neurological:Denies numbness, tingling or new weaknesses MSK: Posterior head pain that radiates into the neck Behavioral/Psych: Mood is stable, no new changes  Extremities: No lower extremity edema All other  systems were reviewed with the patient and are negative.  I have reviewed the past medical history, past surgical history, social history and family history with the patient and they are unchanged from previous note.   PHYSICAL EXAMINATION: ECOG PERFORMANCE STATUS: 2 - Symptomatic, <50% confined to bed  Vitals:   06/09/20 2107 06/10/20 0443  BP: (!) 109/55 117/61  Pulse: 98 99  Resp: 20 20  Temp: 98.7 F (37.1 C) 99.6 F (37.6 C)  SpO2: 97% 95%   Filed Weights   06/04/20 1209  Weight: 71.7 kg    Intake/Output from previous day: 12/12 0701 - 12/13 0700 In: 1200 [P.O.:1200] Out: -   GENERAL: Chronically ill-appearing female, no distress SKIN: skin color, texture, turgor are normal, no rashes or significant lesions EYES: normal, Conjunctiva are pink and non-injected, sclera clear LUNGS: clear to auscultation and percussion with normal breathing effort HEART: regular rate & rhythm and no murmurs and no lower extremity edema ABDOMEN:abdomen soft, non-tender and normal bowel sounds Musculoskeletal: Tenderness to palpation to posterior head/neck NEURO: alert & oriented x 3 with fluent speech, no focal motor/sensory deficits  LABORATORY DATA:  I have reviewed the data as listed CMP Latest Ref Rng & Units 06/10/2020 06/09/2020 06/04/2020  Glucose 70 - 99 mg/dL 110(H) 128(H) 106(H)  BUN 8 - 23 mg/dL 8 6(L) 5(L)  Creatinine 0.44 - 1.00 mg/dL 0.67 0.78 0.87  Sodium 135 - 145 mmol/L 136 136 134(L)  Potassium 3.5 - 5.1 mmol/L 3.2(L) 3.0(L) 3.5  Chloride 98 - 111 mmol/L 102 99 100  CO2 22 - 32 mmol/L '25 26 26  ' Calcium 8.9 - 10.3 mg/dL 7.7(L) 7.9(L) 8.9  Total Protein 6.5 -  8.1 g/dL - - 7.3  Total Bilirubin 0.3 - 1.2 mg/dL - - 0.7  Alkaline Phos 38 - 126 U/L - - 87  AST 15 - 41 U/L - - 16  ALT 0 - 44 U/L - - 18    Lab Results  Component Value Date   WBC 7.3 06/09/2020   HGB 12.6 06/09/2020   HCT 38.3 06/09/2020   MCV 93.9 06/09/2020   PLT 243 06/09/2020   NEUTROABS 4.1  06/04/2020    CT HEAD WO CONTRAST  Result Date: 06/04/2020 CLINICAL DATA:  Delirium, increased confusion for 2 days. Hallucinations. EXAM: CT HEAD WITHOUT CONTRAST TECHNIQUE: Contiguous axial images were obtained from the base of the skull through the vertex without intravenous contrast. COMPARISON:  None. FINDINGS: Brain: No evidence of large-territorial acute infarction. No parenchymal hemorrhage. No mass lesion. No extra-axial collection. No mass effect or midline shift. No hydrocephalus. Basilar cisterns are patent. Vascular: No hyperdense vessel. Skull: No acute fracture or focal lesion. Sinuses/Orbits: Paranasal sinuses and mastoid air cells are clear. The orbits are unremarkable. Other: None. IMPRESSION: No acute intracranial abnormality. Electronically Signed   By: Iven Finn M.D.   On: 06/04/2020 15:57   NM PET Image Initial (PI) Whole Body  Result Date: 05/24/2020 CLINICAL DATA:  Initial treatment strategy for multiple myeloma. EXAM: NUCLEAR MEDICINE PET WHOLE BODY TECHNIQUE: 7.8 mCi F-18 FDG was injected intravenously. Full-ring PET imaging was performed from the head to foot after the radiotracer. CT data was obtained and used for attenuation correction and anatomic localization. Fasting blood glucose: 110 mg/dl COMPARISON:  None. FINDINGS: Mediastinal blood pool activity: SUV max 2.9 HEAD/ NECK: No hypermetabolic cervical lymph nodes are identified.There are no lesions of the pharyngeal mucosal space. Incidental CT findings: none CHEST: There are no hypermetabolic mediastinal, hilar or axillary lymph nodes. No hypermetabolic pulmonary activity or suspicious pulmonary nodularity. Incidental CT findings: Mild dependent atelectasis or scarring in both lungs. Mild atherosclerosis of the aorta, great vessels and coronary arteries. Bilateral breast implants are noted. ABDOMEN/PELVIS: There is no hypermetabolic activity within the liver, adrenal glands, spleen or pancreas. There is no  hypermetabolic nodal activity. Incidental CT findings: Central 1.9 cm cyst in the right hepatic lobe. Mild aortic and branch vessel atherosclerosis. SKELETON: There are multiple hypermetabolic lytic osseous lesions within the axial skeleton. Within the left lateral mass of C1, there is a 2.2 cm lytic lesion involving the foramen transversarium which is significantly hypermetabolic (SUV max 16.9). There is a lytic lesion in the left aspect of the C4 vertebral body with an SUV max of 6.4. Multiple small lesions are present within the thoracolumbar spine. There are multiple hypermetabolic rib and sternal lesions, including a lower sternal lesion with an SUV max of 6.7 which is associated with an anteriorly displaced fracture, and a lesion involving the left 5th rib laterally with an SUV max of 9.4. Relatively limited involvement of the bony pelvis. Incidental CT findings: Status post L3 corpectomy and L2-4 posterior fusion. Probable T3 pathologic fracture. EXTREMITIES: There are multiple small hypermetabolic lesions within the scapula and clavicle bilaterally. In the distal right humeral shaft, there is a hypermetabolic lesion within SUV max of 3.9. There is a possible lesion in the anterior intertrochanteric region of the left femur versus adjacent soft tissue activity. No other significant osseous lesions in the lower extremities. There is a small hypermetabolic soft tissue mass anteriorly in the subcutaneous fat of the mid right thigh. This measures 1.3 x 1.0 cm on image 239/4  and has an SUV max of 3.4. Incidental CT findings: none IMPRESSION: 1. There are multiple hypermetabolic lytic osseous lesions consistent with multiple myeloma. The most prominent lesion is within the left lateral mass of C1 and this likely involves the foramen transversarium. Probable T3 and sternal pathologic fractures. 2. No evidence of solid visceral organ involvement. 3. Small hypermetabolic subcutaneous nodule anteriorly in the mid right  thigh may reflect an incidental soft tissue neoplasm. Electronically Signed   By: Richardean Sale M.D.   On: 05/24/2020 09:22   CT Biopsy  Result Date: 05/27/2020 INDICATION: Multiple myeloma please perform CT-guided bone marrow biopsy for tissue diagnostic purposes. EXAM: CT-GUIDED BONE MARROW BIOPSY AND ASPIRATION MEDICATIONS: None ANESTHESIA/SEDATION: Fentanyl 100 mcg IV; Versed 2 mg IV Sedation Time: 10 Minutes; The patient was continuously monitored during the procedure by the interventional radiology nurse under my direct supervision. COMPLICATIONS: None immediate. PROCEDURE: Informed consent was obtained from the patient following an explanation of the procedure, risks, benefits and alternatives. The patient understands, agrees and consents for the procedure. All questions were addressed. A time out was performed prior to the initiation of the procedure. The patient was positioned prone and non-contrast localization CT was performed of the pelvis to demonstrate the iliac marrow spaces. The operative site was prepped and draped in the usual sterile fashion. Under sterile conditions and local anesthesia, a 22 gauge spinal needle was utilized for procedural planning. Next, an 11 gauge coaxial bone biopsy needle was advanced into the left iliac marrow space. Needle position was confirmed with CT imaging. Initially, a bone marrow aspiration was performed. Next, a bone marrow biopsy was obtained with the 11 gauge outer bone marrow device. Samples were prepared with the cytotechnologist and deemed adequate. The needle was removed and superficial hemostasis was obtained with manual compression. A dressing was applied. The patient tolerated the procedure well without immediate post procedural complication. IMPRESSION: Successful CT guided left iliac bone marrow aspiration and core biopsy. Electronically Signed   By: Sandi Mariscal M.D.   On: 05/27/2020 11:31   CT BONE MARROW BIOPSY & ASPIRATION  Result Date:  05/27/2020 INDICATION: Multiple myeloma please perform CT-guided bone marrow biopsy for tissue diagnostic purposes. EXAM: CT-GUIDED BONE MARROW BIOPSY AND ASPIRATION MEDICATIONS: None ANESTHESIA/SEDATION: Fentanyl 100 mcg IV; Versed 2 mg IV Sedation Time: 10 Minutes; The patient was continuously monitored during the procedure by the interventional radiology nurse under my direct supervision. COMPLICATIONS: None immediate. PROCEDURE: Informed consent was obtained from the patient following an explanation of the procedure, risks, benefits and alternatives. The patient understands, agrees and consents for the procedure. All questions were addressed. A time out was performed prior to the initiation of the procedure. The patient was positioned prone and non-contrast localization CT was performed of the pelvis to demonstrate the iliac marrow spaces. The operative site was prepped and draped in the usual sterile fashion. Under sterile conditions and local anesthesia, a 22 gauge spinal needle was utilized for procedural planning. Next, an 11 gauge coaxial bone biopsy needle was advanced into the left iliac marrow space. Needle position was confirmed with CT imaging. Initially, a bone marrow aspiration was performed. Next, a bone marrow biopsy was obtained with the 11 gauge outer bone marrow device. Samples were prepared with the cytotechnologist and deemed adequate. The needle was removed and superficial hemostasis was obtained with manual compression. A dressing was applied. The patient tolerated the procedure well without immediate post procedural complication. IMPRESSION: Successful CT guided left iliac bone marrow aspiration  and core biopsy. Electronically Signed   By: Sandi Mariscal M.D.   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 -Discussed 05/28/2020 MMP shows M Protein at 0.6 g/dL, K/L light chain ratio at 97.41 -Discussed 05/24/2020 PET/CT ([id number]) which revealed "1. There are multiple hypermetabolic lytic osseous lesions consistent with multiple myeloma. The most prominent lesion is within the left lateral mass of C1 and this likely involves the foramen transversarium. Probable T3 and sternal pathologic fractures." -MRI brain has been ordered but not yet complete secondary to significant pain.  She is scheduled to go later this week to [facility] to have MRI performed under anesthesia. -Bone marrow biopsy from 05/27/2020 showed 5% plasma cells, normal cytogenetics -Advised pt again that myeloma is treatable, but is not curable. -She was advised that her last visit with [place] that an urgent referral to radiation oncology was recommended for palliative radiation to the C1 lesion.  I do not see that this was ever scheduled.  Would recommend reaching out to radiation oncology this admission for consideration of radiation for palliation. -Recommend soft collar neck brace for neck pain. -Patient is currently on treatment with VRD.  This was started on 05/28/2020.  Appears to have missed day 8 and day 11 of her first cycle of treatment.  May need to start her chemotherapy as an inpatient. -Recommend adjusting pain medication to help better control pain.  She is currently on a fentanyl patch at 12 mcg/h.  Consider increasing this to 25 mcg/h.  Continue as needed oxycodone -We will need to resume baby aspirin and acyclovir twice daily once chemotherapy has been restarted.  2) altered mental status -Etiology unclear - ?medications -Has been seen by psychiatry who recommends psych admission once medically stable -Overall cognition seems to be clearing up at this time.   LOS: 5 days   Mikey Bussing, DNP, AGPCNP-BC, AOCNP 06/10/20   ADDENDUM  .Patient was Personally and  independently interviewed, examined and relevant elements of the history of present illness were reviewed in details and an assessment and plan was created. All elements of the patient's history of present illness , assessment and plan were discussed in details with Mikey Bussing, DNP, AGPCNP-BC, AOCNP. The above documentation reflects our combined findings assessment and plan.  -Radiation oncology consultation as inpatient for consideration of C1 lesion palliative RT for pain control. -will restart myeloma systemic treatment when delirium resolved with decreased steroid doses -optimize pain mx -safety precautions.  Sullivan Lone MD MS

## 2020-06-10 NOTE — Care Management Important Message (Signed)
Important Message  Patient Details IM Letter given to the Patient. Name: Lisa Lester MRN: [id number] Date of Birth: 1945-11-18   Medicare Important Message Given:  Yes     [name] 06/10/2020, 1:38 PM

## 2020-06-10 NOTE — Progress Notes (Addendum)
[facility]  PROGRESS NOTE  KASEE HANTZ KPV:[id number] DOB: 02-Feb-1946 DOA: 06/04/2020 PCP: [name], NP Admit date - 06/04/2020   Admitting Physician Desiree Hane, MD  Outpatient Primary MD for the patient is [name], NP  LOS - 5 Brief Narrative   NASHAYLA TELLERIA is a 74 y.o. year old female with medical history significant for  is a 74 year old female with medical history significant for multiple myeloma (diagnosed 03/2020)currently undergoing outpatient chemotherapy who presented on 12/7 with 3 days worsening confusion, agitation and paranoid thinking and behavior witnessed by family and outpatient oncologist preventing infusion therapy.  Hospital course complicated by persistent paranoid thoughts, agitation, confusion with intermittent threats to leave [place].  Patient was placed under involuntary commitment on 12/8.  Subjective  Still having 10/10 neck pain this am A & P   Acute psychosis of unclear etiology, greatly improved.  Not endorsing any confusion, agitation, visual hallucinations or paranoia on 12/11.  Suspect could be related to Decadron received with infusion therapy (40 mg) during last infusion; however that was almost 1 week ago, still some concern could be intracranial in etiology given high concern for potential plasmacytoma on cervical spine.  CT head unremarkable, B12 TSH, ammonia, calcium, UA all unremarkable, UDS positive for opioids.  Could be some element of polypharmacy contributing to symptoms given on 11/30 patient was on fentanyl patch, increased hydrocodone and on xanax.  Nonfocal physical exam, still following commands. -Appreciate psychiatry recommendations , scheduled Haldol, closely monitor QTC, have asked to reevaluate given significant improvement -delirium precautions --Agreeable to allow brain MRI if neck pain can be better controlled, needs sedation with transfer to [place] one-to-one safety  sitter -IVC on 12/8 given psychiatry recommends psychiatric inpatient admission once medically cleared -We will continue to monitor mental status while improving pain regimen  Head, neck, shoulder pain ongoing for past month, consistent with her multiple myeloma. Likely bone pain.    Patient states this is typical pain she has at home and I confirmed in chart review ( in Dr. Irene Limbo note on 04/2020).  Does not seem consistent with meningismal signs given no fever, no leukocytosis, and no worsening mental status despite not receiving any antibiotics.  Flexeril was not helpful --Gabapentin listed on med rec but not taking per oncology office visit due to feeling ill  --Appreciate oncology recommendations, will increase fentanyl patch to 25 mcg, continue scheduled Ativan increase to1 mg 3 times daily, increase as needed oxycodone IR to 10 mg every 6,  -Continue Tylenol as needed, discontinued tramadol -Neurochecks -Add soft collar neck brace for neck pain --Required IV morphine x1 for pain control on 12/13  Multiple myeloma.  Receives outpatient chemotherapy -Currently holding Revlimid while inpatient -Followed by Dr. Irene Limbo as outpatient,  -Oncology recommends discussing with radiation consideration for palliative radiation for left C1 lesion while patient in hospital -   Family Communication  : Family updated at bedside  Code Status : Full  Disposition Plan  :  Patient is from home. Anticipated d/c date: 2 to 3 days. Barriers to d/c or necessity for inpatient status:  Still having persistent neck pain, will need MRI brain with sedation, Psychiatry recommends inpatient psychiatric admission once medically cleared we will asked to reevaluate.  Has involuntary commitment place don 12/8 Consults  : Psychiatry, ONcology  Procedures  :    DVT Prophylaxis  :  Lovenox   MDM: The below labs and imaging reports were reviewed and summarized above.  Medication management as above.  Lab Results   Component Value Date   PLT 243 06/09/2020    Diet :  Diet Order            Diet NPO time specified  Diet effective ____           Diet regular Room service appropriate? Yes; Fluid consistency: Thin  Diet effective now                  Inpatient Medications Scheduled Meds: . cyclobenzaprine  5 mg Oral TID  . enoxaparin (LOVENOX) injection  40 mg Subcutaneous Q24H  . fentaNYL  1 patch Transdermal Q72H  . haloperidol  2 mg Oral BID  . LORazepam  1 mg Oral TID   Continuous Infusions:  PRN Meds:.acetaminophen **OR** acetaminophen, ondansetron **OR** ondansetron (ZOFRAN) IV, oxyCODONE, polyvinyl alcohol  Antibiotics  :   Anti-infectives (From admission, onward)   None       Objective   Vitals:   06/09/20 0533 06/09/20 1310 06/09/20 2107 06/10/20 0443  BP: 131/64 (!) 137/58 (!) 109/55 117/61  Pulse: 100 (!) 110 98 99  Resp: _0 Temp: 99.1 F (37.3 C) 98.5 F (36.9 C) 98.7 F (37.1 C) 99.6 F (37.6 C)  TempSrc: Oral  Oral Oral  SpO2: 96% 97% 97% 95%  Weight:      Height:        SpO2: 95 %  Wt Readings from Last 3 Encounters:  06/04/20 71.7 kg  06/03/20 70.3 kg  05/31/20 73 kg     Intake/Output Summary (Last 24 hours) at 06/10/2020 1337 Last data filed at 06/10/2020 0900 Gross per 24 hour  Intake 600 ml  Output --  Net 600 ml    Physical Exam:   Awake, alert to place and self, time, context In discomfort but no acute distress, neck pain not reproducible by palpation on examination, limited neck range of motion Calm today.  Slightly agitated, paranoid about pain medication reports still struggling to remember things Normal respiratory effort on room air No peripheral edema Following commands, no appreciable focal deficit, no slurring, no facial droop or dysarthria   I have personally reviewed the following:   Data Reviewed:  CBC Recent Labs  Lab 06/04/20 1234 06/09/20 0816  WBC 5.7 7.3  HGB 13.0 12.6  HCT 38.7 38.3  PLT 239  243  MCV 91.9 93.9  MCH 30.9 30.9  MCHC 33.6 32.9  RDW 13.8 13.9  LYMPHSABS 0.7  --   MONOABS 0.6  --   EOSABS 0.1  --   BASOSABS 0.1  --     Chemistries  Recent Labs  Lab 06/04/20 1234 06/09/20 0816 06/09/20 1948 06/10/20 0504  NA 134* 136  --  136  K 3.5 3.0*  --  3.2*  CL 100 99  --  102  CO2 26 26  --  25  GLUCOSE 106* 128*  --  110*  BUN 5* 6*  --  8  CREATININE 0.87 0.78  --  0.67  CALCIUM 8.9 7.9*  --  7.7*  MG  --   --  2.0  --   AST 16  --   --   --   ALT 18  --   --   --   ALKPHOS 87  --   --   --   BILITOT 0.7  --   --   --    ------------------------------------------------------------------------------------------------------------------ No results for input(s): CHOL, HDL,  LDLCALC, TRIG, CHOLHDL, LDLDIRECT in the last 72 hours.  No results found for: HGBA1C ------------------------------------------------------------------------------------------------------------------ No results for input(s): TSH, T4TOTAL, T3FREE, THYROIDAB in the last 72 hours.  Invalid input(s): FREET3 ------------------------------------------------------------------------------------------------------------------ No results for input(s): VITAMINB12, FOLATE, FERRITIN, TIBC, IRON, RETICCTPCT in the last 72 hours.  Coagulation profile No results for input(s): INR, PROTIME in the last 168 hours.  No results for input(s): DDIMER in the last 72 hours.  Cardiac Enzymes No results for input(s): CKMB, TROPONINI, MYOGLOBIN in the last 168 hours.  Invalid input(s): CK ------------------------------------------------------------------------------------------------------------------ No results found for: BNP  Micro Results Recent Results (from the past 240 hour(s))  Resp Panel by RT-PCR (Flu A&B, Covid) Nasopharyngeal Swab     Status: None   Collection Time: 06/04/20  5:47 PM   Specimen: Nasopharyngeal Swab; Nasopharyngeal(NP) swabs in vial transport medium  Result Value Ref Range  Status   SARS Coronavirus 2 by RT PCR NEGATIVE NEGATIVE Final    Comment: (NOTE) SARS-CoV-2 target nucleic acids are NOT DETECTED.  The SARS-CoV-2 RNA is generally detectable in upper respiratory specimens during the acute phase of infection. The lowest concentration of SARS-CoV-2 viral copies this assay can detect is 138 copies/mL. A negative result does not preclude SARS-Cov-2 infection and should not be used as the sole basis for treatment or other patient management decisions. A negative result may occur with  improper specimen collection/handling, submission of specimen other than nasopharyngeal swab, presence of viral mutation(s) within the areas targeted by this assay, and inadequate number of viral copies(<138 copies/mL). A negative result must be combined with clinical observations, patient history, and epidemiological information. The expected result is Negative.  Fact Sheet for Patients:  EntrepreneurPulse.com.au  Fact Sheet for Healthcare Providers:  IncredibleEmployment.be  This test is no t yet approved or cleared by the [place] FDA and  has been authorized for detection and/or diagnosis of SARS-CoV-2 by FDA under an Emergency Use Authorization (EUA). This EUA will remain  in effect (meaning this test can be used) for the duration of the COVID-19 declaration under Section 564(b)(1) of the Act, 21 U.S.C.section 360bbb-3(b)(1), unless the authorization is terminated  or revoked sooner.       Influenza A by PCR NEGATIVE NEGATIVE Final   Influenza B by PCR NEGATIVE NEGATIVE Final    Comment: (NOTE) The Xpert Xpress SARS-CoV-2/FLU/RSV plus assay is intended as an aid in the diagnosis of influenza from Nasopharyngeal swab specimens and should not be used as a sole basis for treatment. Nasal washings and aspirates are unacceptable for Xpert Xpress SARS-CoV-2/FLU/RSV testing.  Fact Sheet for  Patients: EntrepreneurPulse.com.au  Fact Sheet for Healthcare Providers: IncredibleEmployment.be  This test is not yet approved or cleared by the [place] FDA and has been authorized for detection and/or diagnosis of SARS-CoV-2 by FDA under an Emergency Use Authorization (EUA). This EUA will remain in effect (meaning this test can be used) for the duration of the COVID-19 declaration under Section 564(b)(1) of the Act, 21 U.S.C. section 360bbb-3(b)(1), unless the authorization is terminated or revoked.  Performed at [facility], [place] [address] [place], [place] [address]     Radiology Reports CT HEAD WO CONTRAST  Result Date: 06/04/2020 CLINICAL DATA:  Delirium, increased confusion for 2 days. Hallucinations. EXAM: CT HEAD WITHOUT CONTRAST TECHNIQUE: Contiguous axial images were obtained from the base of the skull through the vertex without intravenous contrast. COMPARISON:  None. FINDINGS: Brain: No evidence of large-territorial acute infarction. No parenchymal hemorrhage. No mass lesion. No  extra-axial collection. No mass effect or midline shift. No hydrocephalus. Basilar cisterns are patent. Vascular: No hyperdense vessel. Skull: No acute fracture or focal lesion. Sinuses/Orbits: Paranasal sinuses and mastoid air cells are clear. The orbits are unremarkable. Other: None. IMPRESSION: No acute intracranial abnormality. Electronically Signed   By: Iven Finn M.D.   On: 06/04/2020 15:57   NM PET Image Initial (PI) Whole Body  Result Date: 05/24/2020 CLINICAL DATA:  Initial treatment strategy for multiple myeloma. EXAM: NUCLEAR MEDICINE PET WHOLE BODY TECHNIQUE: 7.8 mCi F-18 FDG was injected intravenously. Full-ring PET imaging was performed from the head to foot after the radiotracer. CT data was obtained and used for attenuation correction and anatomic localization. Fasting blood glucose: 110 mg/dl COMPARISON:  None.  FINDINGS: Mediastinal blood pool activity: SUV max 2.9 HEAD/ NECK: No hypermetabolic cervical lymph nodes are identified.There are no lesions of the pharyngeal mucosal space. Incidental CT findings: none CHEST: There are no hypermetabolic mediastinal, hilar or axillary lymph nodes. No hypermetabolic pulmonary activity or suspicious pulmonary nodularity. Incidental CT findings: Mild dependent atelectasis or scarring in both lungs. Mild atherosclerosis of the aorta, great vessels and coronary arteries. Bilateral breast implants are noted. ABDOMEN/PELVIS: There is no hypermetabolic activity within the liver, adrenal glands, spleen or pancreas. There is no hypermetabolic nodal activity. Incidental CT findings: Central 1.9 cm cyst in the right hepatic lobe. Mild aortic and branch vessel atherosclerosis. SKELETON: There are multiple hypermetabolic lytic osseous lesions within the axial skeleton. Within the left lateral mass of C1, there is a 2.2 cm lytic lesion involving the foramen transversarium which is significantly hypermetabolic (SUV max 09.3). There is a lytic lesion in the left aspect of the C4 vertebral body with an SUV max of 6.4. Multiple small lesions are present within the thoracolumbar spine. There are multiple hypermetabolic rib and sternal lesions, including a lower sternal lesion with an SUV max of 6.7 which is associated with an anteriorly displaced fracture, and a lesion involving the left 5th rib laterally with an SUV max of 9.4. Relatively limited involvement of the bony pelvis. Incidental CT findings: Status post L3 corpectomy and L2-4 posterior fusion. Probable T3 pathologic fracture. EXTREMITIES: There are multiple small hypermetabolic lesions within the scapula and clavicle bilaterally. In the distal right humeral shaft, there is a hypermetabolic lesion within SUV max of 3.9. There is a possible lesion in the anterior intertrochanteric region of the left femur versus adjacent soft tissue activity.  No other significant osseous lesions in the lower extremities. There is a small hypermetabolic soft tissue mass anteriorly in the subcutaneous fat of the mid right thigh. This measures 1.3 x 1.0 cm on image 239/4 and has an SUV max of 3.4. Incidental CT findings: none IMPRESSION: 1. There are multiple hypermetabolic lytic osseous lesions consistent with multiple myeloma. The most prominent lesion is within the left lateral mass of C1 and this likely involves the foramen transversarium. Probable T3 and sternal pathologic fractures. 2. No evidence of solid visceral organ involvement. 3. Small hypermetabolic subcutaneous nodule anteriorly in the mid right thigh may reflect an incidental soft tissue neoplasm. Electronically Signed   By: Richardean Sale M.D.   On: 05/24/2020 09:22   CT Biopsy  Result Date: 05/27/2020 INDICATION: Multiple myeloma please perform CT-guided bone marrow biopsy for tissue diagnostic purposes. EXAM: CT-GUIDED BONE MARROW BIOPSY AND ASPIRATION MEDICATIONS: None ANESTHESIA/SEDATION: Fentanyl 100 mcg IV; Versed 2 mg IV Sedation Time: 10 Minutes; The patient was continuously monitored during the procedure by the interventional radiology  nurse under my direct supervision. COMPLICATIONS: None immediate. PROCEDURE: Informed consent was obtained from the patient following an explanation of the procedure, risks, benefits and alternatives. The patient understands, agrees and consents for the procedure. All questions were addressed. A time out was performed prior to the initiation of the procedure. The patient was positioned prone and non-contrast localization CT was performed of the pelvis to demonstrate the iliac marrow spaces. The operative site was prepped and draped in the usual sterile fashion. Under sterile conditions and local anesthesia, a 22 gauge spinal needle was utilized for procedural planning. Next, an 11 gauge coaxial bone biopsy needle was advanced into the left iliac marrow space.  Needle position was confirmed with CT imaging. Initially, a bone marrow aspiration was performed. Next, a bone marrow biopsy was obtained with the 11 gauge outer bone marrow device. Samples were prepared with the cytotechnologist and deemed adequate. The needle was removed and superficial hemostasis was obtained with manual compression. A dressing was applied. The patient tolerated the procedure well without immediate post procedural complication. IMPRESSION: Successful CT guided left iliac bone marrow aspiration and core biopsy. Electronically Signed   By: Sandi Mariscal M.D.   On: 05/27/2020 11:31   CT BONE MARROW BIOPSY & ASPIRATION  Result Date: 05/27/2020 INDICATION: Multiple myeloma please perform CT-guided bone marrow biopsy for tissue diagnostic purposes. EXAM: CT-GUIDED BONE MARROW BIOPSY AND ASPIRATION MEDICATIONS: None ANESTHESIA/SEDATION: Fentanyl 100 mcg IV; Versed 2 mg IV Sedation Time: 10 Minutes; The patient was continuously monitored during the procedure by the interventional radiology nurse under my direct supervision. COMPLICATIONS: None immediate. PROCEDURE: Informed consent was obtained from the patient following an explanation of the procedure, risks, benefits and alternatives. The patient understands, agrees and consents for the procedure. All questions were addressed. A time out was performed prior to the initiation of the procedure. The patient was positioned prone and non-contrast localization CT was performed of the pelvis to demonstrate the iliac marrow spaces. The operative site was prepped and draped in the usual sterile fashion. Under sterile conditions and local anesthesia, a 22 gauge spinal needle was utilized for procedural planning. Next, an 11 gauge coaxial bone biopsy needle was advanced into the left iliac marrow space. Needle position was confirmed with CT imaging. Initially, a bone marrow aspiration was performed. Next, a bone marrow biopsy was obtained with the 11 gauge  outer bone marrow device. Samples were prepared with the cytotechnologist and deemed adequate. The needle was removed and superficial hemostasis was obtained with manual compression. A dressing was applied. The patient tolerated the procedure well without immediate post procedural complication. IMPRESSION: Successful CT guided left iliac bone marrow aspiration and core biopsy. Electronically Signed   By: Sandi Mariscal M.D.   On: 05/27/2020 11:31     Time Spent in minutes  30     Desiree Hane M.D on 06/10/2020 at 1:37 PM  To page go to [email or web address] - password [facility]

## 2020-06-11 ENCOUNTER — Telehealth: Payer: Self-pay | Admitting: *Deleted

## 2020-06-11 DIAGNOSIS — C9000 Multiple myeloma not having achieved remission: Secondary | ICD-10-CM

## 2020-06-11 LAB — BASIC METABOLIC PANEL
Anion gap: 9 (ref 5–15)
BUN: 9 mg/dL (ref 8–23)
CO2: 25 mmol/L (ref 22–32)
Calcium: 7.8 mg/dL — ABNORMAL LOW (ref 8.9–10.3)
Chloride: 102 mmol/L (ref 98–111)
Creatinine, Ser: 0.73 mg/dL (ref 0.44–1.00)
GFR, Estimated: >60 mL/min (ref >60)
Glucose, Bld: 116 mg/dL — ABNORMAL HIGH (ref 70–99)
Potassium: 3.8 mmol/L (ref 3.5–5.1)
Sodium: 136 mmol/L (ref 135–145)

## 2020-06-11 LAB — MAGNESIUM: Magnesium: 1.9 mg/dL (ref 1.7–2.4)

## 2020-06-11 MED ORDER — OXYCODONE HCL 5 MG PO TABS
10.0000 mg | ORAL_TABLET | ORAL | Status: DC | PRN
  Administered 2020-06-11 – 2020-06-17 (×26): 10 mg via ORAL
  Filled 2020-06-11 (×26): qty 2

## 2020-06-11 MED ORDER — MORPHINE SULFATE (PF) 2 MG/ML IV SOLN
1.0000 mg | INTRAVENOUS | Status: DC | PRN
  Administered 2020-06-11 – 2020-06-14 (×13): 1 mg via INTRAVENOUS
  Filled 2020-06-11 (×13): qty 1

## 2020-06-11 MED ORDER — LENALIDOMIDE 15 MG PO CAPS: ORAL_CAPSULE | ORAL | 0 refills | Status: DC

## 2020-06-11 MED ORDER — HALOPERIDOL LACTATE 5 MG/ML IJ SOLN
INTRAMUSCULAR | Status: AC
  Filled 2020-06-11: qty 1

## 2020-06-11 NOTE — TOC Transition Note (Signed)
Transition of Care [facility]) - CM/SW Discharge Note   Patient Details  Name: Lisa Lester MRN: [id number] Date of Birth: 31-Mar-1946  Transition of Care [facility]) CM/SW Contact:  Dessa Phi, RN Phone Number: 06/11/2020, 4:25 PM   Clinical Narrative:   IVC to be rescinded-form on shadow for MD to sign. Once signed to be faxed to [facility] of [place].    Final next level of care: Home/Self Care Barriers to Discharge: No Barriers Identified   Patient Goals and CMS Choice        Discharge Placement                       Discharge Plan and Services                                     Social Determinants of Health (SDOH) Interventions     Readmission Risk Interventions No flowsheet data found.

## 2020-06-11 NOTE — Plan of Care (Signed)
  Problem: Health Behavior/Discharge Planning: Goal: Ability to manage health-related needs will improve Outcome: Progressing   Problem: Safety: Goal: Ability to remain free from injury will improve Outcome: Completed/Met

## 2020-06-11 NOTE — Plan of Care (Signed)
  Problem: Health Behavior/Discharge Planning: Goal: Ability to manage health-related needs will improve 06/11/2020 0649 by Glenice Bow, RN Outcome: Progressing 06/11/2020 0649 by Glenice Bow, RN Outcome: Progressing   Problem: Clinical Measurements: Goal: Ability to maintain clinical measurements within normal limits will improve 06/11/2020 0649 by Glenice Bow, RN Outcome: Progressing 06/11/2020 0649 by Glenice Bow, RN Outcome: Progressing Goal: Will remain free from infection 06/11/2020 0649 by Glenice Bow, RN Outcome: Progressing 06/11/2020 0649 by Glenice Bow, RN Outcome: Progressing Goal: Diagnostic test results will improve 06/11/2020 0649 by Glenice Bow, RN Outcome: Progressing 06/11/2020 0649 by Glenice Bow, RN Outcome: Progressing Goal: Cardiovascular complication will be avoided 06/11/2020 0649 by Glenice Bow, RN Outcome: Progressing 06/11/2020 0649 by Glenice Bow, RN Outcome: Progressing   Problem: Safety: Goal: Ability to remain free from injury will improve 06/11/2020 0649 by Glenice Bow, RN Outcome: Progressing 06/11/2020 0649 by Glenice Bow, RN Outcome: Progressing

## 2020-06-11 NOTE — Progress Notes (Signed)
Orthopedic Tech Progress Note Patient Details:  Lisa Lester [facility] Mar 16, 1946 [id number]  Ortho Devices Type of Ortho Device: Soft collar Ortho Device/Splint Interventions: Application   Post Interventions Patient Tolerated: Well Instructions Provided: Adjustment of device,Care of device   Lisa Lester 06/11/2020, 7:44 AM

## 2020-06-11 NOTE — Telephone Encounter (Signed)
Contacted by [facility]/rep Kennyth Lose. Rx for Revlimid sent to them without Auth #. [facility] requested [employer] Auth # obtained and called to [place] with [facility] at [phone].  [employer] auth# [id number]. Called [facility] with this auth #.  Per Dr. Irene Limbo - Medication is currently on hold due to hospitalization of patient. Will resume once patient is medically stabilized and d/c'd. Contacted niece [name] with this information. She verbalized understanding

## 2020-06-11 NOTE — Consult Note (Addendum)
[facility] Consult   Reason for Consult:  Confusion Referring Physician:  Dr. 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 female patient admitted with altered mental status.  Psych consult placed for confusion, agitation, visual hallucinations, now A & O. Pt is seen and assessed by this nurse practitioner. On evaluation she is much improved since the previous evaluation. She is now able to hold a conversation and explain the events leading up to her admission. All of which she admits to with the exception of the radio and voices. She is able to tell me about pickle ball being a low impact form of tennis, and she would like to start playing. She also reports the events at the infusion clinic and she was very anxious at that time. She appears to have improved judgment and insight. She is also has understanding, apprecition, and rationalization for her ongoing need for medical services, therefore she now has capacity to make her decisions. She remains on Haldol BID at this time. She denies any suicidal ideation, homicidal ideations and or hallucinations.    HPI:   SAFAA STINGLEY is a 74 y.o. year old female with medical history significant for  is a 74 year old female with medical history significant for multiple myeloma (diagnosed 03/2020)currently undergoing outpatient chemotherapy who presented on 12/7 with 3 days worsening confusion, agitation and paranoid thinking and behavior witnessed by family and outpatient oncologist preventing infusion therapy.  Hospital course complicated by persistent  paranoid thoughts, agitation, confusion with intermittent threats to leave [place].  Patient was placed under involuntary commitment on 12/8.   She had PET scan- 05/24/20 "1. There are multiple hypermetabolic lytic osseous lesions consistent with multiple myeloma. The most prominent lesion is within the left lateral mass of C1 and this likely involves the foramen transversarium. Probable T3 and sternal pathologic fractures. 2. No evidence of solid visceral organ involvement. 3. Small hypermetabolic subcutaneous nodule anteriorly in the mid right thigh may reflect an incidental soft tissue neoplasm."  Past Psychiatric History: Anxiety, previously prescribed lorazepam 2 mg p.o. nightly as needed.  She reports her current psychiatrist is Dr. [name] Gang, and last seen her in the month of November.  She states she has been a long standing patient of Dr. Mervyn Gay, and she is aware of her history.  She denies any previous inpatient admission.  She denies any current or previous suicide attempt.  Risk to Self:  Denies Risk to Others:  Denies Prior Inpatient Therapy:  Denies Prior Outpatient Therapy:  Dr. [name] Gang  Past Medical History:  Past Medical History:  Diagnosis Date  . Multiple myeloma ([place])   . Pathologic fracture    L3  . 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 BACK SURGERY    . BREAST ENHANCEMENT SURGERY Bilateral   . LUMBAR FUSION     L2 - L4, hx of pathologic fx of L3   Family History:  Family History  Problem Relation Age of Onset  . Cancer - Other Mother   . Cancer - Other Father  Family Psychiatric  History: She denies Social History:  Social History   Substance and Sexual Activity  Alcohol Use Never     Social History   Substance and Sexual Activity  Drug Use Never    Social History   Socioeconomic History  . Marital status: Widowed    Spouse name: russell Lightcap  . Number of children: 0  . Years of  education: 35  . Highest education level: 12th grade  Occupational History  . Not on file  Tobacco Use  . Smoking status: Never Smoker  . Smokeless tobacco: Never Used  Vaping Use  . Vaping Use: Never used  Substance and Sexual Activity  . Alcohol use: Never  . Drug use: Never  . Sexual activity: Not Currently  Other Topics Concern  . Not on file  Social History Narrative  . Not on file   Social Determinants of Health   Financial Resource Strain: Not on file  Food Insecurity: Not on file  Transportation Needs: Not on file  Physical Activity: Not on file  Stress: Not on file  Social Connections: Not on file   Additional Social History:    Allergies:  No Known Allergies  Labs:  Results for orders placed or performed during the hospital encounter of 06/04/20 (from the past 48 hour(s))  Magnesium     Status: None   Collection Time: 06/09/20  7:48 PM  Result Value Ref Range   Magnesium 2.0 1.7 - 2.4 mg/dL    Comment: Performed at [facility], [place] [address], [place], [place] [address]  Basic metabolic panel     Status: Abnormal   Collection Time: 06/10/20  5:04 AM  Result Value Ref Range   Sodium 136 135 - 145 mmol/L   Potassium 3.2 (L) 3.5 - 5.1 mmol/L   Chloride 102 98 - 111 mmol/L   CO2 25 22 - 32 mmol/L   Glucose, Bld 110 (H) 70 - 99 mg/dL    Comment: Glucose reference range applies only to samples taken after fasting for at least 8 hours.   BUN 8 8 - 23 mg/dL   Creatinine, Ser 0.67 0.44 - 1.00 mg/dL   Calcium 7.7 (L) 8.9 - 10.3 mg/dL   GFR, Estimated >60 >60 mL/min    Comment: (NOTE) Calculated using the CKD-EPI Creatinine Equation (2021)    Anion gap 9 5 - 15    Comment: Performed at [facility], [place] [address], [place], [place] [address]  Basic metabolic panel     Status: Abnormal   Collection Time: 06/11/20  5:25 AM  Result Value Ref Range   Sodium 136 135 - 145 mmol/L   Potassium 3.8 3.5 - 5.1 mmol/L   Chloride  102 98 - 111 mmol/L   CO2 25 22 - 32 mmol/L   Glucose, Bld 116 (H) 70 - 99 mg/dL    Comment: Glucose reference range applies only to samples taken after fasting for at least 8 hours.   BUN 9 8 - 23 mg/dL   Creatinine, Ser 0.73 0.44 - 1.00 mg/dL   Calcium 7.8 (L) 8.9 - 10.3 mg/dL   GFR, Estimated >60 >60 mL/min    Comment: (NOTE) Calculated using the CKD-EPI Creatinine Equation (2021)    Anion gap 9 5 - 15    Comment: Performed at [facility], [place] [address], [place], [place] [address]  Magnesium     Status: None   Collection Time: 06/11/20  5:25 AM  Result Value Ref Range   Magnesium 1.9 1.7 - 2.4 mg/dL    Comment: Performed at [facility], [place] [address], [place], [place] [address]    Current Facility-Administered Medications  Medication Dose Route Frequency Provider Last Rate Last Admin  . acetaminophen (TYLENOL) tablet 650 mg  650 mg Oral Q6H PRN Kc, Ramesh, MD   650 mg at 06/11/20 1457   Or  . acetaminophen (TYLENOL) suppository 650 mg  650 mg Rectal Q6H PRN Kc, Ramesh, MD      . cyclobenzaprine (FLEXERIL) tablet 5 mg  5 mg Oral TID Oretha Milch D, MD   5 mg at 06/11/20 1002  . enoxaparin (LOVENOX) injection 40 mg  40 mg Subcutaneous Q24H Kc, Ramesh, MD   40 mg at 06/10/20 2119  . fentaNYL (DURAGESIC) 25 MCG/HR 1 patch  1 patch Transdermal Q72H Oretha Milch D, MD   1 patch at 06/10/20 2122  . haloperidol (HALDOL) tablet 2 mg  2 mg Oral BID Suella Broad, FNP   2 mg at 06/11/20 1001  . LORazepam (ATIVAN) tablet 1 mg  1 mg Oral TID Oretha Milch D, MD   1 mg at 06/11/20 1001  . morphine 2 MG/ML injection 1 mg  1 mg Intravenous Q3H PRN Oretha Milch D, MD   1 mg at 06/11/20 1356  . ondansetron (ZOFRAN) tablet 4 mg  4 mg Oral Q6H PRN Kc, Ramesh, MD       Or  . ondansetron (ZOFRAN) injection 4 mg  4 mg Intravenous Q6H PRN Kc, Ramesh, MD      . oxyCODONE (Oxy IR/ROXICODONE) immediate release tablet 10 mg  10 mg Oral Q6H PRN  Oretha Milch D, MD   10 mg at 06/11/20 1244  . polyvinyl alcohol (LIQUIFILM TEARS) 1.4 % ophthalmic solution 1 drop  1 drop Both Eyes PRN Desiree Hane, MD        Musculoskeletal: Strength & Muscle Tone: within normal limits Gait & Station: unsteady Patient leans: N/A  Psychiatric Specialty Exam: Physical Exam   Review of Systems   Blood pressure 122/62, pulse 74, temperature 98.5 F (36.9 C), temperature source Oral, resp. rate 18, height '5\' 6"'  (1.676 m), weight 71.7 kg, SpO2 96 %.Body mass index is 25.5 kg/m.  General Appearance: Casual  Eye Contact:  Fair  Speech:  Clear and Coherent and Normal Rate  Volume:  Normal  Mood:  Anxious and Euphoric  Affect:  Congruent, Full Range and Restricted  Thought Process:  Coherent, Irrelevant and Descriptions of Associations: Loose  Orientation:  Other:  Alert to self and place only  Thought Content:  Logical, Illogical, Delusions, Ilusions, Paranoid Ideation, Rumination and Tangential  Suicidal Thoughts:  No  Homicidal Thoughts:  No  Memory:  Immediate;   Fair Recent;   Fair  Judgement:  Fair  Insight:  Shallow  Psychomotor Activity:  Normal  Concentration:  Concentration: Fair and Attention Span: Fair  Recall:  Poor  Fund of Knowledge:  Fair  Language:  Fair  Akathisia:  No  Handed:  Right  AIMS (if indicated):     Assets:  Communication Skills Desire for Improvement Financial Resources/Insurance Housing Social Support  ADL's:  Intact  Cognition:  Impaired,  Mild  Sleep:        Treatment Plan Summary: Plan Will continue Haldol 32m po BID. She appears to be improved at this time, by demonstrating capacity and her ability to make decisions.  -Rescind IVC at this time as  she no longer poses a risk to her self or others. -DC one-to-one [employer] cabin crew -Patient did express interest regarding decisions leading up to her having her"Rights taken away".  This nurse practitioner provided a lengthy explanation for her previous  presentation, confusion, hallucinations, and state of psychosis that presented to the emergency room.  Did discuss with patient that we would be rescinding her IVC, and discontinue her safety sitter as now she is no longer exhibiting psychosis.  She verbalizes understanding.  Support and encouragement and reassurance were offered.  Disposition: No evidence of imminent risk to self or others at present.   Patient does not meet criteria for psychiatric inpatient admission. Supportive therapy provided about ongoing stressors. Refer to IOP. Discussed crisis plan, support from social network, calling 911, coming to the Emergency Department, and calling Suicide Hotline.  Suella Broad, FNP 06/11/2020 3:24 PM

## 2020-06-11 NOTE — Progress Notes (Signed)
[facility]  PROGRESS NOTE  Lisa Lester:[id number] DOB: 1946/03/09 DOA: 06/04/2020 PCP: [name], NP Admit date - 06/04/2020   Admitting Physician Desiree Hane, MD  Outpatient Primary MD for the patient is [name], NP  LOS - 6 Brief Narrative   Lisa Lester is a 74 y.o. year old female with medical history significant for  is a 74 year old female with medical history significant for multiple myeloma (diagnosed 03/2020)currently undergoing outpatient chemotherapy who presented on 12/7 with 3 days worsening confusion, agitation and paranoid thinking and behavior witnessed by family and outpatient oncologist preventing infusion therapy.  Hospital course complicated by persistent paranoid thoughts, agitation, confusion with intermittent threats to leave [place].  Patient was placed under involuntary commitment on 12/8. Her neck pain is believed to be due to C1 lesion  ("multiple hypermetabolic lytic osseous lesions consistent with multiple myeloma")from MM as evident from PET scan (05/24/2020) as outpatient at the time urgent referral for palliative radiation was placed but patient did not do. She continues to have neck pain and oncology recommends that radiation oncology be consulted as inpatient given the severity of her pain. She is essentially back at baseline regarding her mental status this was likely exacerbated by decadron from last infusion and polypharmacy from ativan and opioids    Subjective  Still having 10/10 neck pain this am. Needed IV morphine for breakthrough A & P   Acute psychosis suspect polypharmacy, resolved.  Back at baseline, not endorsing any confusion, agitation, visual hallucinations or paranoia.  Reevaluated by psychiatry she does not exhibit any signs of psychosis or hallucinations patient does have capacity.  Suspect could be related to Decadron received with infusion therapy (40 mg) during last infusion complicated by  opioid regimen( fentanyl, oxycodone, xanax); however that was almost 1 week ago, still some concern could be intracranial in etiology given MM on cervical spine.  CT head unremarkable, B12 TSH, ammonia, calcium, UA all unremarkable, UDS positive for opioids.   -Appreciate psychiatry recommendations , she no longer meets criteria for inpatient psychiatric admission, will DC IVC, will DC one-to-one safety sitter scheduled  -delirium precautions -Continue one-to-one safety sitter   Head, neck, shoulder pain ongoing for past month, consistent with her multiple myeloma. Likely bone pain.     Does not seem consistent with meningismal signs given no fever, no leukocytosis, and no worsening mental status despite not receiving any antibiotics.  Flexeril was not helpful. PET scan from 05/24/2020 with noted C1 lesion . She remains neurologically intact --Discussed with Dr. Tammi Klippel with radiation oncology, will need the MRI Cervical Spine before able to start any radiation --Needs transfer to [facility] to have MRI with sedation, most likely able to on 12/15 --Gabapentin listed on med rec but not taking per oncology office visit due to feeling ill  --Appreciate oncology recommendations,   --Pain control: fentanyl patch to 25 mcg, continue scheduled Ativan 1 mg 3 times daily, oxycodone IR to 10 mg every 6, IV morphine for severe breakthrough pain, Continue Tylenol as needed, discontinued tramadol -Neurochecks -soft collar neck brace for neck pain   Multiple myeloma.  Receives outpatient chemotherapy -Currently holding Revlimid while inpatient -Followed by Dr. Irene Limbo as outpatient,  -Oncology recommends discussing with radiation consideration for palliative radiation for left C1 lesion while patient in hospital --Discussed with radiation oncology, need MRI C spine before any radiation    Family Communication  : Nephew doug updated on 12/13 by phone  Code Status : Full  Disposition Plan  :  Patient is from home.  Anticipated d/c date: 2 to 3 days. Barriers to d/c or necessity for inpatient status:  Still having persistent neck pain, will need MRI brain and nec with sedation, no longer meets criteria for IVC, patient has capacity Consults  : Psychiatry, ONcology  Procedures  :    DVT Prophylaxis  :  Lovenox   MDM: The below labs and imaging reports were reviewed and summarized above.  Medication management as above.  Lab Results  Component Value Date   PLT 243 06/09/2020    Diet :  Diet Order            Diet NPO time specified  Diet effective ____           Diet regular Room service appropriate? Yes; Fluid consistency: Thin  Diet effective now                  Inpatient Medications Scheduled Meds: . cyclobenzaprine  5 mg Oral TID  . enoxaparin (LOVENOX) injection  40 mg Subcutaneous Q24H  . fentaNYL  1 patch Transdermal Q72H  . haloperidol  2 mg Oral BID  . LORazepam  1 mg Oral TID   Continuous Infusions:  PRN Meds:.acetaminophen **OR** acetaminophen, morphine injection, ondansetron **OR** ondansetron (ZOFRAN) IV, oxyCODONE, polyvinyl alcohol  Antibiotics  :   Anti-infectives (From admission, onward)   None       Objective   Vitals:   06/10/20 0443 06/10/20 1538 06/11/20 0612 06/11/20 0749  BP: 117/61 (!) 114/59 122/62   Pulse: 99 (!) 101 74   Resp: 20 18    Temp: 99.6 F (37.6 C) 98.8 F (37.1 C) 99.2 F (37.3 C) 98.5 F (36.9 C)  TempSrc: Oral Oral Oral Oral  SpO2: 95% 96% 96%   Weight:      Height:        SpO2: 96 %  Wt Readings from Last 3 Encounters:  06/04/20 71.7 kg  06/03/20 70.3 kg  05/31/20 73 kg     Intake/Output Summary (Last 24 hours) at 06/11/2020 1002 Last data filed at 06/11/2020 0500 Gross per 24 hour  Intake 780 ml  Output --  Net 780 ml    Physical Exam:   Awake, alert to place and self, time, context In discomfort but no acute distress, neck pain not reproducible by palpation on examination, limited neck range of  motion Normal affect Normal respiratory effort on room air No peripheral edema Following commands, no appreciable focal deficit, no slurring, no facial droop or dysarthria   I have personally reviewed the following:   Data Reviewed:  CBC Recent Labs  Lab 06/04/20 1234 06/09/20 0816  WBC 5.7 7.3  HGB 13.0 12.6  HCT 38.7 38.3  PLT 239 243  MCV 91.9 93.9  MCH 30.9 30.9  MCHC 33.6 32.9  RDW 13.8 13.9  LYMPHSABS 0.7  --   MONOABS 0.6  --   EOSABS 0.1  --   BASOSABS 0.1  --     Chemistries  Recent Labs  Lab 06/04/20 1234 06/09/20 0816 06/09/20 1948 06/10/20 0504 06/11/20 0525  NA 134* 136  --  136 136  K 3.5 3.0*  --  3.2* 3.8  CL 100 99  --  102 102  CO2 26 26  --  25 25  GLUCOSE 106* 128*  --  110* 116*  BUN 5* 6*  --  8 9  CREATININE 0.87 0.78  --  0.67 0.73  CALCIUM 8.9 7.9*  --  7.7* 7.8*  MG  --   --  2.0  --  1.9  AST 16  --   --   --   --   ALT 18  --   --   --   --   ALKPHOS 87  --   --   --   --   BILITOT 0.7  --   --   --   --    ------------------------------------------------------------------------------------------------------------------ No results for input(s): CHOL, HDL, LDLCALC, TRIG, CHOLHDL, LDLDIRECT in the last 72 hours.  No results found for: HGBA1C ------------------------------------------------------------------------------------------------------------------ No results for input(s): TSH, T4TOTAL, T3FREE, THYROIDAB in the last 72 hours.  Invalid input(s): FREET3 ------------------------------------------------------------------------------------------------------------------ No results for input(s): VITAMINB12, FOLATE, FERRITIN, TIBC, IRON, RETICCTPCT in the last 72 hours.  Coagulation profile No results for input(s): INR, PROTIME in the last 168 hours.  No results for input(s): DDIMER in the last 72 hours.  Cardiac Enzymes No results for input(s): CKMB, TROPONINI, MYOGLOBIN in the last 168 hours.  Invalid input(s):  CK ------------------------------------------------------------------------------------------------------------------ No results found for: BNP  Micro Results Recent Results (from the past 240 hour(s))  Resp Panel by RT-PCR (Flu A&B, Covid) Nasopharyngeal Swab     Status: None   Collection Time: 06/04/20  5:47 PM   Specimen: Nasopharyngeal Swab; Nasopharyngeal(NP) swabs in vial transport medium  Result Value Ref Range Status   SARS Coronavirus 2 by RT PCR NEGATIVE NEGATIVE Final    Comment: (NOTE) SARS-CoV-2 target nucleic acids are NOT DETECTED.  The SARS-CoV-2 RNA is generally detectable in upper respiratory specimens during the acute phase of infection. The lowest concentration of SARS-CoV-2 viral copies this assay can detect is 138 copies/mL. A negative result does not preclude SARS-Cov-2 infection and should not be used as the sole basis for treatment or other patient management decisions. A negative result may occur with  improper specimen collection/handling, submission of specimen other than nasopharyngeal swab, presence of viral mutation(s) within the areas targeted by this assay, and inadequate number of viral copies(<138 copies/mL). A negative result must be combined with clinical observations, patient history, and epidemiological information. The expected result is Negative.  Fact Sheet for Patients:  EntrepreneurPulse.com.au  Fact Sheet for Healthcare Providers:  IncredibleEmployment.be  This test is no t yet approved or cleared by the [place] FDA and  has been authorized for detection and/or diagnosis of SARS-CoV-2 by FDA under an Emergency Use Authorization (EUA). This EUA will remain  in effect (meaning this test can be used) for the duration of the COVID-19 declaration under Section 564(b)(1) of the Act, 21 U.S.C.section 360bbb-3(b)(1), unless the authorization is terminated  or revoked sooner.       Influenza A by  PCR NEGATIVE NEGATIVE Final   Influenza B by PCR NEGATIVE NEGATIVE Final    Comment: (NOTE) The Xpert Xpress SARS-CoV-2/FLU/RSV plus assay is intended as an aid in the diagnosis of influenza from Nasopharyngeal swab specimens and should not be used as a sole basis for treatment. Nasal washings and aspirates are unacceptable for Xpert Xpress SARS-CoV-2/FLU/RSV testing.  Fact Sheet for Patients: EntrepreneurPulse.com.au  Fact Sheet for Healthcare Providers: IncredibleEmployment.be  This test is not yet approved or cleared by the [place] FDA and has been authorized for detection and/or diagnosis of SARS-CoV-2 by FDA under an Emergency Use Authorization (EUA). This EUA will remain in effect (meaning this test can be used) for the duration of the COVID-19 declaration under Section 564(b)(1) of the Act,  21 U.S.C. section 360bbb-3(b)(1), unless the authorization is terminated or revoked.  Performed at [facility], [place] [address], [place], [place] [address]     Radiology Reports CT HEAD WO CONTRAST  Result Date: 06/04/2020 CLINICAL DATA:  Delirium, increased confusion for 2 days. Hallucinations. EXAM: CT HEAD WITHOUT CONTRAST TECHNIQUE: Contiguous axial images were obtained from the base of the skull through the vertex without intravenous contrast. COMPARISON:  None. FINDINGS: Brain: No evidence of large-territorial acute infarction. No parenchymal hemorrhage. No mass lesion. No extra-axial collection. No mass effect or midline shift. No hydrocephalus. Basilar cisterns are patent. Vascular: No hyperdense vessel. Skull: No acute fracture or focal lesion. Sinuses/Orbits: Paranasal sinuses and mastoid air cells are clear. The orbits are unremarkable. Other: None. IMPRESSION: No acute intracranial abnormality. Electronically Signed   By: Iven Finn M.D.   On: 06/04/2020 15:57   NM PET Image Initial (PI) Whole Body  Result Date:  05/24/2020 CLINICAL DATA:  Initial treatment strategy for multiple myeloma. EXAM: NUCLEAR MEDICINE PET WHOLE BODY TECHNIQUE: 7.8 mCi F-18 FDG was injected intravenously. Full-ring PET imaging was performed from the head to foot after the radiotracer. CT data was obtained and used for attenuation correction and anatomic localization. Fasting blood glucose: 110 mg/dl COMPARISON:  None. FINDINGS: Mediastinal blood pool activity: SUV max 2.9 HEAD/ NECK: No hypermetabolic cervical lymph nodes are identified.There are no lesions of the pharyngeal mucosal space. Incidental CT findings: none CHEST: There are no hypermetabolic mediastinal, hilar or axillary lymph nodes. No hypermetabolic pulmonary activity or suspicious pulmonary nodularity. Incidental CT findings: Mild dependent atelectasis or scarring in both lungs. Mild atherosclerosis of the aorta, great vessels and coronary arteries. Bilateral breast implants are noted. ABDOMEN/PELVIS: There is no hypermetabolic activity within the liver, adrenal glands, spleen or pancreas. There is no hypermetabolic nodal activity. Incidental CT findings: Central 1.9 cm cyst in the right hepatic lobe. Mild aortic and branch vessel atherosclerosis. SKELETON: There are multiple hypermetabolic lytic osseous lesions within the axial skeleton. Within the left lateral mass of C1, there is a 2.2 cm lytic lesion involving the foramen transversarium which is significantly hypermetabolic (SUV max 93.8). There is a lytic lesion in the left aspect of the C4 vertebral body with an SUV max of 6.4. Multiple small lesions are present within the thoracolumbar spine. There are multiple hypermetabolic rib and sternal lesions, including a lower sternal lesion with an SUV max of 6.7 which is associated with an anteriorly displaced fracture, and a lesion involving the left 5th rib laterally with an SUV max of 9.4. Relatively limited involvement of the bony pelvis. Incidental CT findings: Status post L3  corpectomy and L2-4 posterior fusion. Probable T3 pathologic fracture. EXTREMITIES: There are multiple small hypermetabolic lesions within the scapula and clavicle bilaterally. In the distal right humeral shaft, there is a hypermetabolic lesion within SUV max of 3.9. There is a possible lesion in the anterior intertrochanteric region of the left femur versus adjacent soft tissue activity. No other significant osseous lesions in the lower extremities. There is a small hypermetabolic soft tissue mass anteriorly in the subcutaneous fat of the mid right thigh. This measures 1.3 x 1.0 cm on image 239/4 and has an SUV max of 3.4. Incidental CT findings: none IMPRESSION: 1. There are multiple hypermetabolic lytic osseous lesions consistent with multiple myeloma. The most prominent lesion is within the left lateral mass of C1 and this likely involves the foramen transversarium. Probable T3 and sternal pathologic fractures. 2. No evidence of solid  visceral organ involvement. 3. Small hypermetabolic subcutaneous nodule anteriorly in the mid right thigh may reflect an incidental soft tissue neoplasm. Electronically Signed   By: Richardean Sale M.D.   On: 05/24/2020 09:22   CT Biopsy  Result Date: 05/27/2020 INDICATION: Multiple myeloma please perform CT-guided bone marrow biopsy for tissue diagnostic purposes. EXAM: CT-GUIDED BONE MARROW BIOPSY AND ASPIRATION MEDICATIONS: None ANESTHESIA/SEDATION: Fentanyl 100 mcg IV; Versed 2 mg IV Sedation Time: 10 Minutes; The patient was continuously monitored during the procedure by the interventional radiology nurse under my direct supervision. COMPLICATIONS: None immediate. PROCEDURE: Informed consent was obtained from the patient following an explanation of the procedure, risks, benefits and alternatives. The patient understands, agrees and consents for the procedure. All questions were addressed. A time out was performed prior to the initiation of the procedure. The patient was  positioned prone and non-contrast localization CT was performed of the pelvis to demonstrate the iliac marrow spaces. The operative site was prepped and draped in the usual sterile fashion. Under sterile conditions and local anesthesia, a 22 gauge spinal needle was utilized for procedural planning. Next, an 11 gauge coaxial bone biopsy needle was advanced into the left iliac marrow space. Needle position was confirmed with CT imaging. Initially, a bone marrow aspiration was performed. Next, a bone marrow biopsy was obtained with the 11 gauge outer bone marrow device. Samples were prepared with the cytotechnologist and deemed adequate. The needle was removed and superficial hemostasis was obtained with manual compression. A dressing was applied. The patient tolerated the procedure well without immediate post procedural complication. IMPRESSION: Successful CT guided left iliac bone marrow aspiration and core biopsy. Electronically Signed   By: Sandi Mariscal M.D.   On: 05/27/2020 11:31   CT BONE MARROW BIOPSY & ASPIRATION  Result Date: 05/27/2020 INDICATION: Multiple myeloma please perform CT-guided bone marrow biopsy for tissue diagnostic purposes. EXAM: CT-GUIDED BONE MARROW BIOPSY AND ASPIRATION MEDICATIONS: None ANESTHESIA/SEDATION: Fentanyl 100 mcg IV; Versed 2 mg IV Sedation Time: 10 Minutes; The patient was continuously monitored during the procedure by the interventional radiology nurse under my direct supervision. COMPLICATIONS: None immediate. PROCEDURE: Informed consent was obtained from the patient following an explanation of the procedure, risks, benefits and alternatives. The patient understands, agrees and consents for the procedure. All questions were addressed. A time out was performed prior to the initiation of the procedure. The patient was positioned prone and non-contrast localization CT was performed of the pelvis to demonstrate the iliac marrow spaces. The operative site was prepped and draped  in the usual sterile fashion. Under sterile conditions and local anesthesia, a 22 gauge spinal needle was utilized for procedural planning. Next, an 11 gauge coaxial bone biopsy needle was advanced into the left iliac marrow space. Needle position was confirmed with CT imaging. Initially, a bone marrow aspiration was performed. Next, a bone marrow biopsy was obtained with the 11 gauge outer bone marrow device. Samples were prepared with the cytotechnologist and deemed adequate. The needle was removed and superficial hemostasis was obtained with manual compression. A dressing was applied. The patient tolerated the procedure well without immediate post procedural complication. IMPRESSION: Successful CT guided left iliac bone marrow aspiration and core biopsy. Electronically Signed   By: Sandi Mariscal M.D.   On: 05/27/2020 11:31     Time Spent in minutes  30     Desiree Hane M.D on 06/11/2020 at 10:02 AM  To page go to [email or web address] - password [email or web address]

## 2020-06-11 NOTE — Progress Notes (Signed)
  Radiation Oncology         ([phone]) [phone] ________________________________  Name: Lisa Lester MRN: [id number]  Date: 06/04/2020  DOB: [birth year]  Chart Note:  I got a call from the hospitalist service.  Patient has painful C1 lesion from myeloma, awaiting cervical spine MRI with sedation at [facility].  We will coordinate rad-onc consult post-MRI for consideration of C-spine radiotherapy   ________________________________  Sheral Apley. Tammi Klippel, M.D.

## 2020-06-12 ENCOUNTER — Inpatient Hospital Stay: Admission: RE | Admit: 2020-06-12 | Payer: Medicare Other | Source: Home / Self Care

## 2020-06-12 ENCOUNTER — Other Ambulatory Visit (HOSPITAL_COMMUNITY): Payer: Medicare Other

## 2020-06-12 ENCOUNTER — Ambulatory Visit
Admit: 2020-06-12 | Discharge: 2020-06-12 | Disposition: A | Discharge status: Home / Self Care | Payer: Medicare Other | Source: Ambulatory Visit | Attending: Radiation Oncology | Admitting: Radiation Oncology

## 2020-06-12 ENCOUNTER — Ambulatory Visit (HOSPITAL_COMMUNITY): Admit: 2020-06-12 | Payer: Medicare Other

## 2020-06-12 VITALS — BP 140/68 | HR 106 | Resp 18

## 2020-06-12 DIAGNOSIS — C9000 Multiple myeloma not having achieved remission: Secondary | ICD-10-CM

## 2020-06-12 DIAGNOSIS — Z79899 Other long term (current) drug therapy: Secondary | ICD-10-CM | POA: Insufficient documentation

## 2020-06-12 DIAGNOSIS — I700 Atherosclerosis of aorta: Secondary | ICD-10-CM | POA: Insufficient documentation

## 2020-06-12 DIAGNOSIS — C7951 Secondary malignant neoplasm of bone: Secondary | ICD-10-CM | POA: Insufficient documentation

## 2020-06-12 DIAGNOSIS — Z510 Encounter for antineoplastic radiation therapy: Secondary | ICD-10-CM | POA: Insufficient documentation

## 2020-06-12 DIAGNOSIS — R918 Other nonspecific abnormal finding of lung field: Secondary | ICD-10-CM | POA: Insufficient documentation

## 2020-06-12 DIAGNOSIS — M542 Cervicalgia: Secondary | ICD-10-CM

## 2020-06-12 LAB — BASIC METABOLIC PANEL
Anion gap: 10 (ref 5–15)
BUN: 7 mg/dL — ABNORMAL LOW (ref 8–23)
CO2: 27 mmol/L (ref 22–32)
Calcium: 8.2 mg/dL — ABNORMAL LOW (ref 8.9–10.3)
Chloride: 101 mmol/L (ref 98–111)
Creatinine, Ser: 0.82 mg/dL (ref 0.44–1.00)
GFR, Estimated: >60 mL/min (ref >60)
Glucose, Bld: 104 mg/dL — ABNORMAL HIGH (ref 70–99)
Potassium: 4.3 mmol/L (ref 3.5–5.1)
Sodium: 138 mmol/L (ref 135–145)

## 2020-06-12 LAB — MAGNESIUM: Magnesium: 1.9 mg/dL (ref 1.7–2.4)

## 2020-06-12 MED ORDER — ALPRAZOLAM 0.5 MG PO TABS
0.5000 mg | ORAL_TABLET | Freq: Three times a day (TID) | ORAL | Status: DC | PRN
  Administered 2020-06-13 – 2020-06-18 (×14): 0.5 mg via ORAL
  Filled 2020-06-12 (×14): qty 1

## 2020-06-12 MED ORDER — HYDROMORPHONE HCL 1 MG/ML IJ SOLN
1.0000 mg | Freq: Once | INTRAMUSCULAR | Status: AC
Start: 2020-06-12 — End: 2020-06-12
  Administered 2020-06-12: 15:00:00 1 mg via INTRAVENOUS
  Filled 2020-06-12: qty 1

## 2020-06-12 NOTE — Progress Notes (Signed)
Lisa Lester         [phone] ________________________________  Initial inpatient Consultation  Name: Lisa Lester MRN: [id number]  Date: 06/12/2020  DOB: 1945/10/10  REFERRING PHYSICIAN: [name], NP  DIAGNOSIS: 74 yo woman with painful spinal C1 lesion from multiple myeloma    ICD-10-CM   1. Multiple myeloma not having achieved remission (HCC)  C90.00 HYDROmorphone (DILAUDID) injection 1 mg    HISTORY OF PRESENT ILLNESS::Lisa Lester is a 74 y.o. 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  She developed multiple myeloma with painful lesions of C1 and sternum.  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  She was hospitalized for psychosis and severe neck pain.    PREVIOUS RADIATION THERAPY: Yes L4 lumbar radiotherapy at [facility]  Past Medical History:  Diagnosis Date  . Multiple myeloma ([place])   . Pathologic fracture    L3  . Plasmacytoma ([place])   :  Past Surgical History:  Procedure Laterality Date  . BACK SURGERY    . BREAST ENHANCEMENT SURGERY Bilateral   . LUMBAR FUSION     L2 - L4, hx of pathologic fx of L3  :  No current facility-administered medications for this encounter.  Current Outpatient Medications:  .  lenalidomide (REVLIMID) 15 MG capsule, TAKE 1 CAPSULE BY MOUTH  DAILY FOR 14 DAYS, THEN 7  DAYS OFF REPEAT EVERY 21  DAYS, Disp: 14 capsule, Rfl: 0  Facility-Administered Medications Ordered in Other Encounters:  .  acetaminophen (TYLENOL) tablet 650 mg, 650 mg, Oral, Q6H PRN, 650 mg at 06/11/20 1457 **OR** acetaminophen (TYLENOL) suppository 650 mg, 650 mg, Rectal, Q6H PRN, Kc, Ramesh, MD .  cyclobenzaprine (FLEXERIL) tablet 5 mg, 5 mg, Oral, TID, Oretha Milch D, MD, 5 mg at 06/12/20 1545 .  enoxaparin (LOVENOX) injection 40 mg, 40 mg, Subcutaneous, Q24H, Kc, Ramesh, MD, 40 mg at 06/11/20 2043 .  fentaNYL (DURAGESIC) 25 MCG/HR 1 patch, 1 patch,  Transdermal, Q72H, Oretha Milch D, MD, 1 patch at 06/10/20 2122 .  haloperidol (HALDOL) tablet 2 mg, 2 mg, Oral, BID, Starkes-Perry, Gayland Curry, FNP, 2 mg at 06/12/20 0947 .  LORazepam (ATIVAN) tablet 1 mg, 1 mg, Oral, TID, Oretha Milch D, MD, 1 mg at 06/12/20 1545 .  morphine 2 MG/ML injection 1 mg, 1 mg, Intravenous, Q3H PRN, Oretha Milch D, MD, 1 mg at 06/12/20 1358 .  ondansetron (ZOFRAN) tablet 4 mg, 4 mg, Oral, Q6H PRN **OR** ondansetron (ZOFRAN) injection 4 mg, 4 mg, Intravenous, Q6H PRN, Kc, Ramesh, MD .  oxyCODONE (Oxy IR/ROXICODONE) immediate release tablet 10 mg, 10 mg, Oral, Q4H PRN, Oretha Milch D, MD, 10 mg at 06/12/20 0754 .  polyvinyl alcohol (LIQUIFILM TEARS) 1.4 % ophthalmic solution 1 drop, 1 drop, Both Eyes, PRN, Oretha Milch D, MD:  No Known Allergies:  Family History  Problem Relation Age of Onset  . Cancer - Other Mother   . Cancer - Other Father   :  Social History   Socioeconomic History  . Marital status: Widowed    Spouse name: russell Beauregard  . Number of children: 0  . Years of education: 53  . Highest education level: 12th grade  Occupational History  . Not on file  Tobacco Use  . Smoking status: Never Smoker  . Smokeless tobacco: Never Used  Vaping Use  . Vaping Use: Never used  Substance and Sexual Activity  . Alcohol use: Never  .  Drug use: Never  . Sexual activity: Not Currently  Other Topics Concern  . Not on file  Social History Narrative  . Not on file   Social Determinants of Health   Financial Resource Strain: Not on file  Food Insecurity: Not on file  Transportation Needs: Not on file  Physical Activity: Not on file  Stress: Not on file  Social Connections: Not on file  Intimate Partner Violence: Not on file  :  REVIEW OF SYSTEMS:  A 15 point review of systems is documented in the electronic medical record. This was obtained by the nursing staff. However, I reviewed this with the patient to discuss relevant findings and  make appropriate changes.  Pertinent items are noted in HPI.   PHYSICAL EXAM:  Blood pressure 140/68, pulse (!) 106, resp. rate 18, SpO2 98 %. Patient is alert and oriented.  She is uncomfortable at rest.  No respiratory issues.  KPS = 30  100 - Normal; no complaints; no evidence of disease. 90   - Able to carry on normal activity; minor signs or symptoms of disease. 80   - Normal activity with effort; some signs or symptoms of disease. 55   - Cares for self; unable to carry on normal activity or to do active work. 60   - Requires occasional assistance, but is able to care for most of his personal needs. 50   - Requires considerable assistance and frequent medical care. 3   - Disabled; requires special care and assistance. 60   - Severely disabled; hospital admission is indicated although death not imminent. 70   - Very sick; hospital admission necessary; active supportive treatment necessary. 10   - Moribund; fatal processes progressing rapidly. 0     - Dead  Karnofsky DA, Abelmann [place], Craver LS and Burchenal [facility] [phone]) The use of the nitrogen mustards in the palliative treatment of carcinoma: with particular reference to bronchogenic carcinoma Cancer 1 634-56  LABORATORY DATA:  Lab Results  Component Value Date   WBC 7.3 06/09/2020   HGB 12.6 06/09/2020   HCT 38.3 06/09/2020   MCV 93.9 06/09/2020   PLT 243 06/09/2020   Lab Results  Component Value Date   NA 138 06/12/2020   K 4.3 06/12/2020   CL 101 06/12/2020   CO2 27 06/12/2020   Lab Results  Component Value Date   ALT 18 06/04/2020   AST 16 06/04/2020   ALKPHOS 87 06/04/2020   BILITOT 0.7 06/04/2020     RADIOGRAPHY: CT HEAD WO CONTRAST  Result Date: 06/04/2020 CLINICAL DATA:  Delirium, increased confusion for 2 days. Hallucinations. EXAM: CT HEAD WITHOUT CONTRAST TECHNIQUE: Contiguous axial images were obtained from the base of the skull through the vertex without intravenous contrast. COMPARISON:  None. FINDINGS:  Brain: No evidence of large-territorial acute infarction. No parenchymal hemorrhage. No mass lesion. No extra-axial collection. No mass effect or midline shift. No hydrocephalus. Basilar cisterns are patent. Vascular: No hyperdense vessel. Skull: No acute fracture or focal lesion. Sinuses/Orbits: Paranasal sinuses and mastoid air cells are clear. The orbits are unremarkable. Other: None. IMPRESSION: No acute intracranial abnormality. Electronically Signed   By: Iven Finn M.D.   On: 06/04/2020 15:57   NM PET Image Initial (PI) Whole Body  Result Date: 05/24/2020 CLINICAL DATA:  Initial treatment strategy for multiple myeloma. EXAM: NUCLEAR MEDICINE PET WHOLE BODY TECHNIQUE: 7.8 mCi F-18 FDG was injected intravenously. Full-ring PET imaging was performed from the head to foot after the radiotracer. CT  data was obtained and used for attenuation correction and anatomic localization. Fasting blood glucose: 110 mg/dl COMPARISON:  None. FINDINGS: Mediastinal blood pool activity: SUV max 2.9 HEAD/ NECK: No hypermetabolic cervical lymph nodes are identified.There are no lesions of the pharyngeal mucosal space. Incidental CT findings: none CHEST: There are no hypermetabolic mediastinal, hilar or axillary lymph nodes. No hypermetabolic pulmonary activity or suspicious pulmonary nodularity. Incidental CT findings: Mild dependent atelectasis or scarring in both lungs. Mild atherosclerosis of the aorta, great vessels and coronary arteries. Bilateral breast implants are noted. ABDOMEN/PELVIS: There is no hypermetabolic activity within the liver, adrenal glands, spleen or pancreas. There is no hypermetabolic nodal activity. Incidental CT findings: Central 1.9 cm cyst in the right hepatic lobe. Mild aortic and branch vessel atherosclerosis. SKELETON: There are multiple hypermetabolic lytic osseous lesions within the axial skeleton. Within the left lateral mass of C1, there is a 2.2 cm lytic lesion involving the foramen  transversarium which is significantly hypermetabolic (SUV max 80.3). There is a lytic lesion in the left aspect of the C4 vertebral body with an SUV max of 6.4. Multiple small lesions are present within the thoracolumbar spine. There are multiple hypermetabolic rib and sternal lesions, including a lower sternal lesion with an SUV max of 6.7 which is associated with an anteriorly displaced fracture, and a lesion involving the left 5th rib laterally with an SUV max of 9.4. Relatively limited involvement of the bony pelvis. Incidental CT findings: Status post L3 corpectomy and L2-4 posterior fusion. Probable T3 pathologic fracture. EXTREMITIES: There are multiple small hypermetabolic lesions within the scapula and clavicle bilaterally. In the distal right humeral shaft, there is a hypermetabolic lesion within SUV max of 3.9. There is a possible lesion in the anterior intertrochanteric region of the left femur versus adjacent soft tissue activity. No other significant osseous lesions in the lower extremities. There is a small hypermetabolic soft tissue mass anteriorly in the subcutaneous fat of the mid right thigh. This measures 1.3 x 1.0 cm on image 239/4 and has an SUV max of 3.4. Incidental CT findings: none IMPRESSION: 1. There are multiple hypermetabolic lytic osseous lesions consistent with multiple myeloma. The most prominent lesion is within the left lateral mass of C1 and this likely involves the foramen transversarium. Probable T3 and sternal pathologic fractures. 2. No evidence of solid visceral organ involvement. 3. Small hypermetabolic subcutaneous nodule anteriorly in the mid right thigh may reflect an incidental soft tissue neoplasm. Electronically Signed   By: Richardean Sale M.D.   On: 05/24/2020 09:22   CT Biopsy  Result Date: 05/27/2020 INDICATION: Multiple myeloma please perform CT-guided bone marrow biopsy for tissue diagnostic purposes. EXAM: CT-GUIDED BONE MARROW BIOPSY AND ASPIRATION  MEDICATIONS: None ANESTHESIA/SEDATION: Fentanyl 100 mcg IV; Versed 2 mg IV Sedation Time: 10 Minutes; The patient was continuously monitored during the procedure by the interventional radiology nurse under my direct supervision. COMPLICATIONS: None immediate. PROCEDURE: Informed consent was obtained from the patient following an explanation of the procedure, risks, benefits and alternatives. The patient understands, agrees and consents for the procedure. All questions were addressed. A time out was performed prior to the initiation of the procedure. The patient was positioned prone and non-contrast localization CT was performed of the pelvis to demonstrate the iliac marrow spaces. The operative site was prepped and draped in the usual sterile fashion. Under sterile conditions and local anesthesia, a 22 gauge spinal needle was utilized for procedural planning. Next, an 11 gauge coaxial bone biopsy needle was advanced  into the left iliac marrow space. Needle position was confirmed with CT imaging. Initially, a bone marrow aspiration was performed. Next, a bone marrow biopsy was obtained with the 11 gauge outer bone marrow device. Samples were prepared with the cytotechnologist and deemed adequate. The needle was removed and superficial hemostasis was obtained with manual compression. A dressing was applied. The patient tolerated the procedure well without immediate post procedural complication. IMPRESSION: Successful CT guided left iliac bone marrow aspiration and core biopsy. Electronically Signed   By: Sandi Mariscal M.D.   On: 05/27/2020 11:31   CT BONE MARROW BIOPSY & ASPIRATION  Result Date: 05/27/2020 INDICATION: Multiple myeloma please perform CT-guided bone marrow biopsy for tissue diagnostic purposes. EXAM: CT-GUIDED BONE MARROW BIOPSY AND ASPIRATION MEDICATIONS: None ANESTHESIA/SEDATION: Fentanyl 100 mcg IV; Versed 2 mg IV Sedation Time: 10 Minutes; The patient was continuously monitored during the  procedure by the interventional radiology nurse under my direct supervision. COMPLICATIONS: None immediate. PROCEDURE: Informed consent was obtained from the patient following an explanation of the procedure, risks, benefits and alternatives. The patient understands, agrees and consents for the procedure. All questions were addressed. A time out was performed prior to the initiation of the procedure. The patient was positioned prone and non-contrast localization CT was performed of the pelvis to demonstrate the iliac marrow spaces. The operative site was prepped and draped in the usual sterile fashion. Under sterile conditions and local anesthesia, a 22 gauge spinal needle was utilized for procedural planning. Next, an 11 gauge coaxial bone biopsy needle was advanced into the left iliac marrow space. Needle position was confirmed with CT imaging. Initially, a bone marrow aspiration was performed. Next, a bone marrow biopsy was obtained with the 11 gauge outer bone marrow device. Samples were prepared with the cytotechnologist and deemed adequate. The needle was removed and superficial hemostasis was obtained with manual compression. A dressing was applied. The patient tolerated the procedure well without immediate post procedural complication. IMPRESSION: Successful CT guided left iliac bone marrow aspiration and core biopsy. Electronically Signed   By: Sandi Mariscal M.D.   On: 05/27/2020 11:31      IMPRESSION: 74 yo woman with painful spinal C1 myeloma lesion  PLAN:Today, I talked to the patient and family about the findings and work-up thus far.  We discussed the natural history of painful myeloma skeletal lesions and general treatment, highlighting the role of radiotherapy in the management.  We discussed the available radiation techniques, and focused on the details of logistics and delivery.  We reviewed the anticipated acute and late sequelae associated with radiation in this setting.  The patient was  encouraged to ask questions that I answered to the best of my ability. The patient would like to proceed with radiation and will be scheduled for CT simulation.  I spent 60 minutes reviewing records, reviewing PET, CT, meeting patient and completing orders/documentation.    ------------------------------------------------   Tyler Pita, MD [place] Director and Director of Stereotactic Radiosurgery Direct Dial: [phone]  Fax: [phone] [email or web address]  Skype  [email or web address]

## 2020-06-12 NOTE — Consult Note (Signed)
Radiation Oncology         [phone] ________________________________  Initial inpatient Consultation  Name: [place] MRN: [id number]  Date of Service: 06/12/20 DOB: Jan 15, 1946   REFERRING PHYSICIAN: Dr. Irene Limbo  DIAGNOSIS: The encounter diagnosis was Delirium.    ICD-10-CM   1. Delirium  R41.0     HISTORY OF PRESENT ILLNESS: [place] is a 74 y.o. female seen at the request of Dr. 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 The patient was diagnosed with her Myeloma a few weeks ago and is under the care of Dr. Irene Limbo. When she presented, she was found to have hypermetabolic lesions within multiple bones, the most impressive was within the C1 region with a 2.2 cm lytic lesion involving the foramen transversarium that had an SUV of 15.6, C4 vertebral body, scattered lesions throughout the thoracolumbar spine multiple rib and sternal lesions, and the scapula and clavicle right humeral shaft anterior intertrochanteric region of the left femur, and of the subcutaneous tissue of the right thigh.  She was counseled on the rationale for systemic treatment, and began systemic Velcade with Decadron.  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  She is no longer under involuntary commission, and her case has been discussed amongst Dr. Tammi Klippel and Dr. Irene Limbo, significant pain to the cervical spine we have been asked to consider offering palliative radiotherapy.  PREVIOUS RADIATION THERAPY: No  PAST MEDICAL HISTORY:  Past Medical History:  Diagnosis Date  . Multiple myeloma ([place])   . Pathologic fracture    L3  . 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 BACK SURGERY    . BREAST ENHANCEMENT SURGERY Bilateral   . LUMBAR FUSION     L2 - L4, hx of pathologic fx of L3    FAMILY HISTORY:  Family History  Problem Relation Age of Onset  . Cancer - Other Mother   . Cancer - Other Father     SOCIAL HISTORY:  Social History   Socioeconomic History  . Marital status: Widowed    Spouse name: russell Tryon  . Number of children: 0  . Years of education: 78  . Highest education level: 12th grade  Occupational History  . Not on file  Tobacco Use  . Smoking status: Never Smoker  . Smokeless tobacco: Never Used  Vaping Use  . Vaping Use: Never used  Substance and Sexual Activity  . Alcohol use: Never  . Drug use: Never  . Sexual activity: Not Currently  Other Topics Concern  . Not on file  Social History Narrative  . Not on file   Social Determinants of Health   Financial Resource Strain: Not on file  Food Insecurity: Not on file  Transportation Needs: Not on file  Physical Activity: Not on file  Stress: Not on file  Social Connections: Not on file  Intimate Partner Violence: Not on file  The patient is originally from [place], she is here living with her nephew [place].  ALLERGIES: Patient has no known allergies.  MEDICATIONS:  Current Facility-Administered Medications  Medication Dose Route Frequency Provider  Last Rate Last Admin  . acetaminophen (TYLENOL) tablet 650 mg  650 mg Oral Q6H PRN Kc, Ramesh, MD   650 mg at 06/11/20 1457   Or  . acetaminophen (TYLENOL) suppository 650 mg  650 mg Rectal Q6H PRN Kc, Ramesh, MD      . ALPRAZolam Duanne Moron) tablet 0.5 mg  0.5 mg Oral TID PRN Pokhrel, Laxman, MD      . cyclobenzaprine (FLEXERIL) tablet 5 mg  5 mg Oral TID Oretha Milch D, MD   5 mg at 06/12/20 2112  . enoxaparin (LOVENOX) injection 40 mg  40 mg Subcutaneous Q24H Kc, Ramesh, MD   40 mg at 06/12/20 2112  . fentaNYL (DURAGESIC) 25 MCG/HR 1 patch  1 patch  Transdermal Q72H Oretha Milch D, MD   1 patch at 06/10/20 2122  . haloperidol (HALDOL) tablet 2 mg  2 mg Oral BID Suella Broad, FNP   2 mg at 06/12/20 2111  . morphine 2 MG/ML injection 1 mg  1 mg Intravenous Q3H PRN Oretha Milch D, MD   1 mg at 06/12/20 2113  . ondansetron (ZOFRAN) tablet 4 mg  4 mg Oral Q6H PRN Kc, Ramesh, MD       Or  . ondansetron (ZOFRAN) injection 4 mg  4 mg Intravenous Q6H PRN Kc, Ramesh, MD      . oxyCODONE (Oxy IR/ROXICODONE) immediate release tablet 10 mg  10 mg Oral Q4H PRN Oretha Milch D, MD   10 mg at 06/12/20 1657  . polyvinyl alcohol (LIQUIFILM TEARS) 1.4 % ophthalmic solution 1 drop  1 drop Both Eyes PRN Desiree Hane, MD        REVIEW OF SYSTEMS:  On review of systems, the patient reports that she is doing poorly.  Her pain control is her main concern since she cannot get comfortable.  At home she lays on her side, mostly propped up at about a 45 degree angle.  She is been struggling with getting comfortable here at the hospital.  She denies any shortness of breath of difficulty breathing when she is upright or exerting herself or specifically with laying flat.  No other complaints are verbalized.    PHYSICAL EXAM:  Wt Readings from Last 3 Encounters:  06/04/20 158 lb (71.7 kg)  06/03/20 155 lb (70.3 kg)  05/31/20 161 lb (73 kg)   Temp Readings from Last 3 Encounters:  06/12/20 99 F (37.2 C)  06/04/20 98.7 F (37.1 C) (Oral)  06/03/20 98.4 F (36.9 C) (Oral)   BP Readings from Last 3 Encounters:  06/12/20 123/69  06/12/20 140/68  06/04/20 (!) 149/66   Pulse Readings from Last 3 Encounters:  06/12/20 (!) 103  06/12/20 (!) 106  06/04/20 90   Pain Assessment Pain Score: Asleep/10  In general this is a well-appearing Caucasian female in no acute distress.  She is alert and oriented x4 and appropriate throughout the examination.. Cardiopulmonary assessment is negative for acute distress and she exhibits normal effort.    KPS  = 50  100 - Normal; no complaints; no evidence of disease. 90   - Able to carry on normal activity; minor signs or symptoms of disease. 80   - Normal activity with effort; some signs or symptoms of disease. 37   - Cares for self; unable to carry on normal activity or to do active work. 60   - Requires occasional assistance, but is able to care for most of his personal needs. 50   - Requires  considerable assistance and frequent medical care. 86   - Disabled; requires special care and assistance. 73   - Severely disabled; hospital admission is indicated although death not imminent. 3   - Very sick; hospital admission necessary; active supportive treatment necessary. 10   - Moribund; fatal processes progressing rapidly. 0     - Dead  Karnofsky DA, Abelmann [place], Craver LS and Burchenal [facility] [phone]) The use of the nitrogen mustards in the palliative treatment of carcinoma: with particular reference to bronchogenic carcinoma Cancer 1 634-56  LABORATORY DATA:  Lab Results  Component Value Date   WBC 7.3 06/09/2020   HGB 12.6 06/09/2020   HCT 38.3 06/09/2020   MCV 93.9 06/09/2020   PLT 243 06/09/2020   Lab Results  Component Value Date   NA 138 06/12/2020   K 4.3 06/12/2020   CL 101 06/12/2020   CO2 27 06/12/2020   Lab Results  Component Value Date   ALT 18 06/04/2020   AST 16 06/04/2020   ALKPHOS 87 06/04/2020   BILITOT 0.7 06/04/2020     RADIOGRAPHY: CT HEAD WO CONTRAST  Result Date: 06/04/2020 CLINICAL DATA:  Delirium, increased confusion for 2 days. Hallucinations. EXAM: CT HEAD WITHOUT CONTRAST TECHNIQUE: Contiguous axial images were obtained from the base of the skull through the vertex without intravenous contrast. COMPARISON:  None. FINDINGS: Brain: No evidence of large-territorial acute infarction. No parenchymal hemorrhage. No mass lesion. No extra-axial collection. No mass effect or midline shift. No hydrocephalus. Basilar cisterns are patent. Vascular: No hyperdense vessel.  Skull: No acute fracture or focal lesion. Sinuses/Orbits: Paranasal sinuses and mastoid air cells are clear. The orbits are unremarkable. Other: None. IMPRESSION: No acute intracranial abnormality. Electronically Signed   By: Iven Finn M.D.   On: 06/04/2020 15:57   NM PET Image Initial (PI) Whole Body  Result Date: 05/24/2020 CLINICAL DATA:  Initial treatment strategy for multiple myeloma. EXAM: NUCLEAR MEDICINE PET WHOLE BODY TECHNIQUE: 7.8 mCi F-18 FDG was injected intravenously. Full-ring PET imaging was performed from the head to foot after the radiotracer. CT data was obtained and used for attenuation correction and anatomic localization. Fasting blood glucose: 110 mg/dl COMPARISON:  None. FINDINGS: Mediastinal blood pool activity: SUV max 2.9 HEAD/ NECK: No hypermetabolic cervical lymph nodes are identified.There are no lesions of the pharyngeal mucosal space. Incidental CT findings: none CHEST: There are no hypermetabolic mediastinal, hilar or axillary lymph nodes. No hypermetabolic pulmonary activity or suspicious pulmonary nodularity. Incidental CT findings: Mild dependent atelectasis or scarring in both lungs. Mild atherosclerosis of the aorta, great vessels and coronary arteries. Bilateral breast implants are noted. ABDOMEN/PELVIS: There is no hypermetabolic activity within the liver, adrenal glands, spleen or pancreas. There is no hypermetabolic nodal activity. Incidental CT findings: Central 1.9 cm cyst in the right hepatic lobe. Mild aortic and branch vessel atherosclerosis. SKELETON: There are multiple hypermetabolic lytic osseous lesions within the axial skeleton. Within the left lateral mass of C1, there is a 2.2 cm lytic lesion involving the foramen transversarium which is significantly hypermetabolic (SUV max 43.1). There is a lytic lesion in the left aspect of the C4 vertebral body with an SUV max of 6.4. Multiple small lesions are present within the thoracolumbar spine. There are  multiple hypermetabolic rib and sternal lesions, including a lower sternal lesion with an SUV max of 6.7 which is associated with an anteriorly displaced fracture, and a lesion involving the left 5th rib laterally with an SUV max of 9.4. Relatively limited involvement of  the bony pelvis. Incidental CT findings: Status post L3 corpectomy and L2-4 posterior fusion. Probable T3 pathologic fracture. EXTREMITIES: There are multiple small hypermetabolic lesions within the scapula and clavicle bilaterally. In the distal right humeral shaft, there is a hypermetabolic lesion within SUV max of 3.9. There is a possible lesion in the anterior intertrochanteric region of the left femur versus adjacent soft tissue activity. No other significant osseous lesions in the lower extremities. There is a small hypermetabolic soft tissue mass anteriorly in the subcutaneous fat of the mid right thigh. This measures 1.3 x 1.0 cm on image 239/4 and has an SUV max of 3.4. Incidental CT findings: none IMPRESSION: 1. There are multiple hypermetabolic lytic osseous lesions consistent with multiple myeloma. The most prominent lesion is within the left lateral mass of C1 and this likely involves the foramen transversarium. Probable T3 and sternal pathologic fractures. 2. No evidence of solid visceral organ involvement. 3. Small hypermetabolic subcutaneous nodule anteriorly in the mid right thigh may reflect an incidental soft tissue neoplasm. Electronically Signed   By: Richardean Sale M.D.   On: 05/24/2020 09:22   CT Biopsy  Result Date: 05/27/2020 INDICATION: Multiple myeloma please perform CT-guided bone marrow biopsy for tissue diagnostic purposes. EXAM: CT-GUIDED BONE MARROW BIOPSY AND ASPIRATION MEDICATIONS: None ANESTHESIA/SEDATION: Fentanyl 100 mcg IV; Versed 2 mg IV Sedation Time: 10 Minutes; The patient was continuously monitored during the procedure by the interventional radiology nurse under my direct supervision. COMPLICATIONS:  None immediate. PROCEDURE: Informed consent was obtained from the patient following an explanation of the procedure, risks, benefits and alternatives. The patient understands, agrees and consents for the procedure. All questions were addressed. A time out was performed prior to the initiation of the procedure. The patient was positioned prone and non-contrast localization CT was performed of the pelvis to demonstrate the iliac marrow spaces. The operative site was prepped and draped in the usual sterile fashion. Under sterile conditions and local anesthesia, a 22 gauge spinal needle was utilized for procedural planning. Next, an 11 gauge coaxial bone biopsy needle was advanced into the left iliac marrow space. Needle position was confirmed with CT imaging. Initially, a bone marrow aspiration was performed. Next, a bone marrow biopsy was obtained with the 11 gauge outer bone marrow device. Samples were prepared with the cytotechnologist and deemed adequate. The needle was removed and superficial hemostasis was obtained with manual compression. A dressing was applied. The patient tolerated the procedure well without immediate post procedural complication. IMPRESSION: Successful CT guided left iliac bone marrow aspiration and core biopsy. Electronically Signed   By: Sandi Mariscal M.D.   On: 05/27/2020 11:31   CT BONE MARROW BIOPSY & ASPIRATION  Result Date: 05/27/2020 INDICATION: Multiple myeloma please perform CT-guided bone marrow biopsy for tissue diagnostic purposes. EXAM: CT-GUIDED BONE MARROW BIOPSY AND ASPIRATION MEDICATIONS: None ANESTHESIA/SEDATION: Fentanyl 100 mcg IV; Versed 2 mg IV Sedation Time: 10 Minutes; The patient was continuously monitored during the procedure by the interventional radiology nurse under my direct supervision. COMPLICATIONS: None immediate. PROCEDURE: Informed consent was obtained from the patient following an explanation of the procedure, risks, benefits and alternatives. The  patient understands, agrees and consents for the procedure. All questions were addressed. A time out was performed prior to the initiation of the procedure. The patient was positioned prone and non-contrast localization CT was performed of the pelvis to demonstrate the iliac marrow spaces. The operative site was prepped and draped in the usual sterile fashion. Under sterile conditions and  local anesthesia, a 22 gauge spinal needle was utilized for procedural planning. Next, an 11 gauge coaxial bone biopsy needle was advanced into the left iliac marrow space. Needle position was confirmed with CT imaging. Initially, a bone marrow aspiration was performed. Next, a bone marrow biopsy was obtained with the 11 gauge outer bone marrow device. Samples were prepared with the cytotechnologist and deemed adequate. The needle was removed and superficial hemostasis was obtained with manual compression. A dressing was applied. The patient tolerated the procedure well without immediate post procedural complication. IMPRESSION: Successful CT guided left iliac bone marrow aspiration and core biopsy. Electronically Signed   By: Sandi Mariscal M.D.   On: 05/27/2020 11:31      IMPRESSION/PLAN: 1. 74 y.o. woman with multiple myeloma not having achieved remission with multiple metastatic deposits throughout the skeleton but with concern of significant disease at C1. Dr. Tammi Klippel has evaluated the patient's case and has recommended a palliative course of radiotherapy to the c-spine. She was to have an MRI with [facility] today but that has been moved. She will have this tomorrow morning at [facility]. I spent time reviewing her case and the nature of myelomatous disease. The concerns that exist are for concerns for the location of her tumor and the risk of her disease at causing fracture which could cause spinal cord injury at the level of the brainstem.  Her pain is of course of concern as well.  With her nephew also on the phone today, we discussed  the risks, benefits, short and long-term effects of radiotherapy as well as the delivery and logistics of treatment.  She is willing to come down for simulation this afternoon prior to having her MRI scan tomorrow with the hopes that we could consider starting radiotherapy Thursday, 06/13/2020.  She will signed written consent to proceed when she comes down this afternoon, I was able to confirm with the patient and her nephew that they both feel that at this time that she is is close to baseline as possible and should be able to sign all of her medical forms.  Addendum the patient was seen in person in our department this afternoon, her nurse on the floor had premedicated her with 1 mg of morphine IV, about 45 minutes later when we were seeing her, she was still in significant pain and did not feel that she would be able to lay on the table for simulation without additional help with medication her vitals were checked and were appropriate to proceed with additional narcotic administration, she was given 1 mg of IV Dilaudid x1 and her nurse on the floor was notified.  In a visit lasting 75 minutes, greater than 50% of the time was spent in floor time including time on the phone with the patient and her son as well as during her simulation where she was seen face to face. Time was also spent discussing the patient's condition, in preparation for the discussion, and coordinating the patient's care.     Carola Rhine, [facility]   Page Me    Seen with _____________________________________  Sheral Apley Tammi Klippel, M.D.

## 2020-06-12 NOTE — Plan of Care (Signed)
  Problem: Clinical Measurements: Goal: Diagnostic test results will improve Outcome: Progressing   Problem: Health Behavior/Discharge Planning: Goal: Ability to manage health-related needs will improve Outcome: Completed/Met

## 2020-06-12 NOTE — Progress Notes (Signed)
Pt was seen in our simulation department for written consent to cervical spine/skull base palliative radiation planning. She reports she is still in terrible pain even prior to laying on the table. She received 1 mg of Morphine at 1358, since she was still having trouble, we checked vitals, O2 sat on room air is 20-81%, BP 388T systolic, and Pulse [phone]. Dilaudid 1 mg IV was administered by our nursing team.      Carola Rhine, PAC

## 2020-06-12 NOTE — Progress Notes (Signed)
  Radiation Oncology         (336) [phone] ________________________________  Name: BRIER FIREBAUGH MRN: [id number]  Date: 06/12/2020  DOB: December 01, 1945  SIMULATION AND TREATMENT PLANNING NOTE    ICD-10-CM   1. Multiple myeloma not having achieved remission (H[facility])  C90.00   2. Neck pain  M54.2     DIAGNOSIS:  74 yo woman with multiple myeloma and painful C1 lesion  NARRATIVE:  The patient was brought to the [place].  Identity was confirmed.  All relevant records and images related to the planned course of therapy were reviewed.  The patient freely provided informed written consent to proceed with treatment after reviewing the details related to the planned course of therapy. The consent form was witnessed and verified by the simulation staff.  Then, the patient was set-up in a stable reproducible  supine position for radiation therapy.  CT images were obtained.  Surface markings were placed.  The CT images were loaded into the planning software.  Then the target and avoidance structures were contoured.  Treatment planning then occurred.  The radiation prescription was entered and confirmed.  Then, I designed and supervised the construction of a total of 5 medically necessary complex treatment devices including a BodyFix pillow for immobilization and 4 MLCs to shield critical structures.  I have requested : 3D Simulation  I have requested a DVH of the following structures: left parotid, right parotid, oral cavity and spinal cord.  PLAN:  The patient will receive 20 Gy in 10 fractions.  ________________________________  Sheral Apley Tammi Klippel, M.D.

## 2020-06-12 NOTE — Plan of Care (Signed)
  Problem: Health Behavior/Discharge Planning: Goal: Ability to manage health-related needs will improve Outcome: Progressing   

## 2020-06-12 NOTE — Progress Notes (Signed)
PROGRESS NOTE  Lisa Lester BSW:[id number] DOB: December 25, 1945 DOA: 06/04/2020 PCP: [name], NP   LOS: 7 days   Brief narrative:  Lisa Lester is a 74 y.o. year old female with medical history of multiple myeloma (diagnosed 03/2020)currently undergoing outpatient chemotherapy who presented to the hospital on 12/7 with 3 days worsening confusion, agitation and paranoid thinking and behavior witnessed by family and outpatient oncologist, preventing infusion therapy.  She did have a PET scan on 05/24/2020 with multiple lytic lesions are stable multiple myeloma.  Initially in the ED, CT head scan was negative.  Hospital course was complicated by persistent paranoid thoughts, agitation, confusion with intermittent threats to leave [place].  Patient was placed under involuntary commitment on 12/8.  Patient complained of neck pain and was thought to be  due to C1 lesion  ("multiple hypermetabolic lytic osseous lesions consistent with multiple myeloma")from MM as evident from PET scan (05/24/2020). Patient  continues to have neck pain and oncology recommended that radiation oncology be consulted as inpatient given the severity of her pain. Mental status change was thought to be secondary to decadron from last infusion and polypharmacy from ativan and opioids.   Assessment/Plan:  Principal Problem:   Altered mental status, unspecified Active Problems:   Multiple myeloma not having achieved remission (HCC)   Encephalopathy acute   Altered mental status   Acute psychosis ([place])   Neck pain   Acute psychosis suspect polypharmacy, resolved.    Seen by psychiatry.  Patient does have capacity at this time.  Thought to be secondary to Decadron received with infusion therapy (40 mg) during last infusion complicated by opioid regimen( fentanyl, oxycodone, xanax).  CT head was unremarkable, B12 TSH, ammonia, calcium, UA within normal limits.  UDS was positive for opioids.    Was  initially on IVC which was discontinued.  Currently stable.  Head, neck, shoulder pain likely secondary to multiple myeloma. PET scan from 05/24/2020 with noted C1 lesion .  No neurological deficits.  Radiation oncology Dr. Tammi Klippel on board.  Will need MRI of the C-spine prior to radiation.  Currently on multiple regimens for pain control.  Try to minimize if possible.  Could not tolerate soft collar neck.  Will need MRI with sedation at [facility], pending for tomorrow.  Will undergo simulation today.  Multiple myeloma.  Currently on chemotherapy as outpatient by Dr. Irene Limbo. holding Revlimid while inpatient.radiation oncologyon board for possible radiation of the neck area.  DVT prophylaxis: enoxaparin (LOVENOX) injection 40 mg Start: 06/04/20 2200   Code Status: Full code  Family Communication: None today.  Status is: Inpatient  Remains inpatient appropriate because:IV treatments appropriate due to intensity of illness or inability to take PO, Inpatient level of care appropriate due to severity of illness and Further diagnostic testing, possible need for inpatient radiation.   Dispo: The patient is from: Home              Anticipated d/c is to: Undetermined at this time.              Anticipated d/c date is: 2 days              Patient currently is not medically stable to d/c.  Consultants:  Oncology  Radiation oncology  Psychiatry  Procedures:  None  Antibiotics:  . None  Anti-infectives (From admission, onward)   None       Subjective: Today, patient was seen and examined at bedside.  Still complains  of neck pain especially on moving the neck.  Denies any shortness of breath cough fever chills rigors Objective: Vitals:   06/11/20 2206 06/12/20 0442  BP: 134/66 133/70  Pulse: (!) 101 (!) 106  Resp: 16 16  Temp: 98.1 F (36.7 C) 98.9 F (37.2 C)  SpO2: 99% 96%    Intake/Output Summary (Last 24 hours) at 06/12/2020 0837 Last data filed at  06/12/2020 0734 Gross per 24 hour  Intake 440 ml  Output --  Net 440 ml   Filed Weights   06/04/20 1209  Weight: 71.7 kg   Body mass index is 25.5 kg/m.   Physical Exam: GENERAL: Patient is alert awake and oriented. Not in obvious distress.  Thinly built HENT: No scleral pallor or icterus. Pupils equally reactive to light. Oral mucosa is moist.  Tenderness over the nape of the neck. NECK: is supple, no gross swelling noted. CHEST: Clear to auscultation. No crackles or wheezes.  Diminished breath sounds bilaterally. CVS: S1 and S2 heard, no murmur. Regular rate and rhythm.  ABDOMEN: Soft, non-tender, bowel sounds are present. EXTREMITIES: No edema. CNS: Cranial nerves are intact. No focal motor deficits.  Moving all extremities SKIN: warm and dry without rashes.  Data Review: I have personally reviewed the following laboratory data and studies,  CBC: Recent Labs  Lab 06/09/20 0816  WBC 7.3  HGB 12.6  HCT 38.3  MCV 93.9  PLT 449   Basic Metabolic Panel: Recent Labs  Lab 06/09/20 0816 06/09/20 1948 06/10/20 0504 06/11/20 0525 06/12/20 0625  NA 136  --  136 136 138  K 3.0*  --  3.2* 3.8 4.3  CL 99  --  102 102 101  CO2 26  --  '25 25 27  ' GLUCOSE 128*  --  110* 116* 104*  BUN 6*  --  8 9 7*  CREATININE 0.78  --  0.67 0.73 0.82  CALCIUM 7.9*  --  7.7* 7.8* 8.2*  MG  --  2.0  --  1.9 1.9   Liver Function Tests: No results for input(s): AST, ALT, ALKPHOS, BILITOT, PROT, ALBUMIN in the last 168 hours. No results for input(s): LIPASE, AMYLASE in the last 168 hours. No results for input(s): AMMONIA in the last 168 hours. Cardiac Enzymes: No results for input(s): CKTOTAL, CKMB, CKMBINDEX, TROPONINI in the last 168 hours. BNP (last 3 results) No results for input(s): BNP in the last 8760 hours.  ProBNP (last 3 results) No results for input(s): PROBNP in the last 8760 hours.  CBG: No results for input(s): GLUCAP in the last 168 hours. Recent Results (from the past  240 hour(s))  Resp Panel by RT-PCR (Flu A&B, Covid) Nasopharyngeal Swab     Status: None   Collection Time: 06/04/20  5:47 PM   Specimen: Nasopharyngeal Swab; Nasopharyngeal(NP) swabs in vial transport medium  Result Value Ref Range Status   SARS Coronavirus 2 by RT PCR NEGATIVE NEGATIVE Final    Comment: (NOTE) SARS-CoV-2 target nucleic acids are NOT DETECTED.  The SARS-CoV-2 RNA is generally detectable in upper respiratory specimens during the acute phase of infection. The lowest concentration of SARS-CoV-2 viral copies this assay can detect is 138 copies/mL. A negative result does not preclude SARS-Cov-2 infection and should not be used as the sole basis for treatment or other patient management decisions. A negative result may occur with  improper specimen collection/handling, submission of specimen other than nasopharyngeal swab, presence of viral mutation(s) within the areas targeted by this assay, and  inadequate number of viral copies(<138 copies/mL). A negative result must be combined with clinical observations, patient history, and epidemiological information. The expected result is Negative.  Fact Sheet for Patients:  EntrepreneurPulse.com.au  Fact Sheet for Healthcare Providers:  IncredibleEmployment.be  This test is no t yet approved or cleared by the [place] FDA and  has been authorized for detection and/or diagnosis of SARS-CoV-2 by FDA under an Emergency Use Authorization (EUA). This EUA will remain  in effect (meaning this test can be used) for the duration of the COVID-19 declaration under Section 564(b)(1) of the Act, 21 U.S.C.section 360bbb-3(b)(1), unless the authorization is terminated  or revoked sooner.       Influenza A by PCR NEGATIVE NEGATIVE Final   Influenza B by PCR NEGATIVE NEGATIVE Final    Comment: (NOTE) The Xpert Xpress SARS-CoV-2/FLU/RSV plus assay is intended as an aid in the diagnosis of influenza  from Nasopharyngeal swab specimens and should not be used as a sole basis for treatment. Nasal washings and aspirates are unacceptable for Xpert Xpress SARS-CoV-2/FLU/RSV testing.  Fact Sheet for Patients: EntrepreneurPulse.com.au  Fact Sheet for Healthcare Providers: IncredibleEmployment.be  This test is not yet approved or cleared by the [place] FDA and has been authorized for detection and/or diagnosis of SARS-CoV-2 by FDA under an Emergency Use Authorization (EUA). This EUA will remain in effect (meaning this test can be used) for the duration of the COVID-19 declaration under Section 564(b)(1) of the Act, 21 U.S.C. section 360bbb-3(b)(1), unless the authorization is terminated or revoked.  Performed at [facility], [place] [address] [place], [place] [address]      Studies: No results found.    Flora Lipps, MD  [facility] 06/12/2020  If 7PM-7AM, please contact night-coverage

## 2020-06-12 NOTE — Progress Notes (Signed)
Chaplain met with patient and then spoke with nurse Hinton Dyer, who made the consult request.  Patient & chaplain were unclear about visit's purpose.  Chaplain wondered if she had the wrong room number since patient did not request chaplain.  However through the conversation, patient was open and obviously needing to talk, which is why nurse made consult request.  Patient is a recent widow.  Her husband Delsa Bern died of Covid over a year ago, and both of them were hospitalized with Covid at the same time,  She was on another floor and they could not see each other.  She is actively grieving and has complicated grief.  She was tearful.  Patient feels isolation.  She is an only child. No siblings. She and Delsa Bern were together 64 years and they had a very happy marriage.  "When I had anxiety, he always helped me through it. He just calmed me." "He was 6"4 and had white hair."  Patient also lost her dog recently.  She is a pet lover, and enjoys the pets her nephew has. She is currently living with him.  Patient expressed frustration at the isolation of the hospital experience "I miss my dogs...  It's christmas and I'm here."  Patient expressed that she felt uncomfortable during her last MRI.  She asked me to stand in front of her bed during entire visit because she could not turn her head.  Chaplain offered support and asked if patient would like another visit when she is back on Friday.  Patient said yes. Rev. Tamsen Snider Pager [phone]

## 2020-06-12 NOTE — Anesthesia Preprocedure Evaluation (Addendum)
Anesthesia Evaluation  Patient identified by MRN, date of birth, ID band Patient awake    Reviewed: Allergy & Precautions, NPO status , Patient's Chart, lab work & pertinent test results  Airway Mallampati: IV  TM Distance: >3 FB Neck ROM: Full    Dental no notable dental hx.    Pulmonary neg pulmonary ROS,    Pulmonary exam normal breath sounds clear to auscultation       Cardiovascular negative cardio ROS Normal cardiovascular exam Rhythm:Regular Rate:Normal     Neuro/Psych PSYCHIATRIC DISORDERS Schizophrenia negative neurological ROS     GI/Hepatic negative GI ROS, (+)     substance abuse  ,   Endo/Other  negative endocrine ROS  Renal/GU negative Renal ROS     Musculoskeletal  (+) narcotic dependentMultiple myeloma Pathologic fracture   Abdominal   Peds  Hematology negative hematology ROS (+)   Anesthesia Other Findings ACUTE PSYCHOSIS NECK PAIN  Reproductive/Obstetrics                           Anesthesia Physical Anesthesia Plan  ASA: III  Anesthesia Plan: General   Post-op Pain Management:    Induction: Intravenous  PONV Risk Score and Plan: 3 and Ondansetron, Dexamethasone, Treatment may vary due to age or medical condition and Midazolam  Airway Management Planned: Oral ETT and Video Laryngoscope Planned  Additional Equipment:   Intra-op Plan:   Post-operative Plan: Extubation in OR  Informed Consent: I have reviewed the patients History and Physical, chart, labs and discussed the procedure including the risks, benefits and alternatives for the proposed anesthesia with the patient or authorized representative who has indicated his/her understanding and acceptance.     Dental advisory given  Plan Discussed with: CRNA  Anesthesia Plan Comments:         Anesthesia Quick Evaluation

## 2020-06-13 ENCOUNTER — Encounter (HOSPITAL_COMMUNITY)
Admission: EM | Disposition: A | Discharge status: Home / Self Care | Payer: Self-pay | Source: Home / Self Care | Attending: Internal Medicine

## 2020-06-13 ENCOUNTER — Ambulatory Visit (HOSPITAL_COMMUNITY)
Admit: 2020-06-13 | Discharge: 2020-06-13 | Disposition: A | Discharge status: Home / Self Care | Payer: Medicare Other | Attending: Internal Medicine | Admitting: Internal Medicine

## 2020-06-13 ENCOUNTER — Encounter (HOSPITAL_COMMUNITY): Payer: Self-pay | Admitting: Internal Medicine

## 2020-06-13 ENCOUNTER — Inpatient Hospital Stay (HOSPITAL_COMMUNITY): Payer: Medicare Other | Admitting: Certified Registered Nurse Anesthetist

## 2020-06-13 ENCOUNTER — Other Ambulatory Visit (HOSPITAL_COMMUNITY): Payer: Medicare Other

## 2020-06-13 ENCOUNTER — Ambulatory Visit
Admit: 2020-06-13 | Discharge: 2020-06-13 | Disposition: A | Discharge status: Home / Self Care | Payer: Medicare Other | Attending: Radiation Oncology | Admitting: Radiation Oncology

## 2020-06-13 DIAGNOSIS — C9000 Multiple myeloma not having achieved remission: Secondary | ICD-10-CM

## 2020-06-13 HISTORY — PX: RADIOLOGY WITH ANESTHESIA: SHX6223

## 2020-06-13 LAB — CBC
HCT: 33.2 % — ABNORMAL LOW (ref 36.0–46.0)
Hemoglobin: 10.5 g/dL — ABNORMAL LOW (ref 12.0–15.0)
MCH: 30.4 pg (ref 26.0–34.0)
MCHC: 31.6 g/dL (ref 30.0–36.0)
MCV: 96.2 fL (ref 80.0–100.0)
Platelets: 239 10*3/uL (ref 150–400)
RBC: 3.45 MIL/uL — ABNORMAL LOW (ref 3.87–5.11)
RDW: 13.7 % (ref 11.5–15.5)
WBC: 3.3 10*3/uL — ABNORMAL LOW (ref 4.0–10.5)
nRBC: 0 % (ref 0.0–0.2)

## 2020-06-13 LAB — BASIC METABOLIC PANEL
Anion gap: 6 (ref 5–15)
BUN: 6 mg/dL — ABNORMAL LOW (ref 8–23)
CO2: 30 mmol/L (ref 22–32)
Calcium: 8.1 mg/dL — ABNORMAL LOW (ref 8.9–10.3)
Chloride: 104 mmol/L (ref 98–111)
Creatinine, Ser: 0.78 mg/dL (ref 0.44–1.00)
GFR, Estimated: >60 mL/min (ref >60)
Glucose, Bld: 106 mg/dL — ABNORMAL HIGH (ref 70–99)
Potassium: 4.6 mmol/L (ref 3.5–5.1)
Sodium: 140 mmol/L (ref 135–145)

## 2020-06-13 LAB — MAGNESIUM: Magnesium: 2.2 mg/dL (ref 1.7–2.4)

## 2020-06-13 SURGERY — MRI WITH ANESTHESIA
Anesthesia: General

## 2020-06-13 MED ORDER — PROPOFOL 10 MG/ML IV BOLUS
INTRAVENOUS | Status: DC | PRN
  Administered 2020-06-13: 100 mg via INTRAVENOUS
  Administered 2020-06-13: 50 mg via INTRAVENOUS

## 2020-06-13 MED ORDER — MIDAZOLAM HCL 2 MG/2ML IJ SOLN
INTRAMUSCULAR | Status: DC | PRN
  Administered 2020-06-13: 2 mg via INTRAVENOUS

## 2020-06-13 MED ORDER — HYDROMORPHONE HCL 1 MG/ML IJ SOLN
0.5000 mg | Freq: Once | INTRAMUSCULAR | Status: AC
  Administered 2020-06-13: 12:00:00 0.5 mg via INTRAVENOUS

## 2020-06-13 MED ORDER — ONDANSETRON HCL 4 MG/2ML IJ SOLN: 4.0000 mg | Freq: Once | INTRAMUSCULAR | Status: DC | PRN

## 2020-06-13 MED ORDER — PHENYLEPHRINE HCL-NACL 10-0.9 MG/250ML-% IV SOLN
INTRAVENOUS | Status: DC | PRN
  Administered 2020-06-13: 20 ug/min via INTRAVENOUS

## 2020-06-13 MED ORDER — CHLORHEXIDINE GLUCONATE 0.12 % MT SOLN
15.0000 mL | Freq: Once | OROMUCOSAL | Status: AC
  Administered 2020-06-13: 07:00:00 15 mL via OROMUCOSAL
  Filled 2020-06-13: qty 15

## 2020-06-13 MED ORDER — FENTANYL CITRATE (PF) 100 MCG/2ML IJ SOLN: 25.0000 ug | INTRAMUSCULAR | Status: DC | PRN

## 2020-06-13 MED ORDER — HYDROMORPHONE HCL 1 MG/ML IJ SOLN
INTRAMUSCULAR | Status: AC
  Filled 2020-06-13: qty 1

## 2020-06-13 MED ORDER — FENTANYL CITRATE (PF) 250 MCG/5ML IJ SOLN
INTRAMUSCULAR | Status: DC | PRN
  Administered 2020-06-13: 100 ug via INTRAVENOUS
  Administered 2020-06-13 (×4): 50 ug via INTRAVENOUS

## 2020-06-13 MED ORDER — LACTATED RINGERS IV SOLN: INTRAVENOUS | Status: DC

## 2020-06-13 MED ORDER — LIDOCAINE 2% (20 MG/ML) 5 ML SYRINGE
INTRAMUSCULAR | Status: DC | PRN
  Administered 2020-06-13: 100 mg via INTRAVENOUS

## 2020-06-13 MED ORDER — ONDANSETRON HCL 4 MG/2ML IJ SOLN
INTRAMUSCULAR | Status: DC | PRN
  Administered 2020-06-13: 4 mg via INTRAVENOUS

## 2020-06-13 MED ORDER — EPHEDRINE SULFATE-NACL 50-0.9 MG/10ML-% IV SOSY: PREFILLED_SYRINGE | INTRAVENOUS | Status: DC | PRN

## 2020-06-13 MED ORDER — ROCURONIUM BROMIDE 10 MG/ML (PF) SYRINGE
PREFILLED_SYRINGE | INTRAVENOUS | Status: DC | PRN
  Administered 2020-06-13: 20 mg via INTRAVENOUS
  Administered 2020-06-13: 30 mg via INTRAVENOUS

## 2020-06-13 MED ORDER — ORAL CARE MOUTH RINSE: 15.0000 mL | Freq: Once | OROMUCOSAL | Status: AC

## 2020-06-13 MED ORDER — DEXAMETHASONE SODIUM PHOSPHATE 10 MG/ML IJ SOLN
INTRAMUSCULAR | Status: DC | PRN
  Administered 2020-06-13: 5 mg via INTRAVENOUS

## 2020-06-13 MED ORDER — SUGAMMADEX SODIUM 200 MG/2ML IV SOLN
INTRAVENOUS | Status: DC | PRN
  Administered 2020-06-13: 200 mg via INTRAVENOUS

## 2020-06-13 MED ORDER — KETOROLAC TROMETHAMINE 15 MG/ML IJ SOLN
15.0000 mg | Freq: Once | INTRAMUSCULAR | Status: AC | PRN
  Administered 2020-06-13: 15 mg via INTRAVENOUS

## 2020-06-13 MED ORDER — ACETAMINOPHEN 10 MG/ML IV SOLN: 1000.0000 mg | Freq: Once | INTRAVENOUS | Status: DC | PRN

## 2020-06-13 MED ORDER — KETOROLAC TROMETHAMINE 15 MG/ML IJ SOLN
INTRAMUSCULAR | Status: AC
  Filled 2020-06-13: qty 1

## 2020-06-13 NOTE — Anesthesia Postprocedure Evaluation (Signed)
Anesthesia Post Note  Patient: Lisa Lester  Procedure(s) Performed: MRI BRAIN WITHOUT; C-SPINE WITHOUT (N/A )     Patient location during evaluation: PACU Anesthesia Type: General Level of consciousness: awake Pain management: pain level controlled Vital Signs Assessment: post-procedure vital signs reviewed and stable Respiratory status: spontaneous breathing, nonlabored ventilation, respiratory function stable and patient connected to nasal cannula oxygen Cardiovascular status: blood pressure returned to baseline and stable Postop Assessment: no apparent nausea or vomiting Anesthetic complications: no   No complications documented.  Last Vitals:  Vitals:   06/13/20 1110 06/13/20 1130  BP:    Pulse: 94 94  Resp: 12 14  Temp: (!) 36.2 C   SpO2: 95% 96%    Last Pain:  Vitals:   06/13/20 1130  TempSrc:   PainSc: 10-Worst pain ever                 Lisa Lester

## 2020-06-13 NOTE — Transfer of Care (Signed)
Immediate Anesthesia Transfer of Care Note  Patient: Lisa Lester  Procedure(s) Performed: MRI BRAIN WITHOUT; C-SPINE WITHOUT (N/A )  Patient Location: PACU  Anesthesia Type:General  Level of Consciousness: awake and alert   Airway & Oxygen Therapy: Patient Spontanous Breathing and Patient connected to nasal cannula oxygen  Post-op Assessment: Report given to RN and Post -op Vital signs reviewed and stable  Post vital signs: Reviewed and stable  Last Vitals:  Vitals Value Taken Time  BP 135/63 06/13/20 1023  Temp    Pulse 79 06/13/20 1026  Resp 7 06/13/20 1026  SpO2 100 % 06/13/20 1026  Vitals shown include unvalidated device data.  Last Pain:  Vitals:   06/13/20 0608  TempSrc:   PainSc: 5       Patients Stated Pain Goal: 5 (67/70/34 0352)  Complications: No complications documented.

## 2020-06-13 NOTE — Progress Notes (Signed)
PROGRESS NOTE  Lisa Lester CBS:[id number] DOB: 1946/02/27 DOA: 06/04/2020 PCP: [name], NP   LOS: 8 days   Brief narrative:  Lisa Lester is a 74 y.o. year old female with medical history of multiple myeloma (diagnosed 03/2020) currently undergoing outpatient chemotherapy who presented to the hospital on 12/7 with 3 days worsening confusion, agitation and paranoid thinking and behavior witnessed by family and outpatient oncologist, preventing infusion therapy.  She did have a PET scan on 05/24/2020 with multiple lytic lesions are stable multiple myeloma.  Initially in the ED, CT head scan was negative.  Hospital course was complicated by persistent paranoid thoughts, agitation, confusion with intermittent threats to leave [place].  Patient was placed under involuntary commitment on 12/8.  Patient complained of neck pain and was thought to be  due to C1 lesion  ("multiple hypermetabolic lytic osseous lesions consistent with multiple myeloma")from MM as evident from PET scan (05/24/2020). Patient  continues to have neck pain and oncology recommended that radiation oncology be consulted as inpatient given the severity of her pain. Mental status change was thought to be secondary to decadron from last infusion and polypharmacy from ativan and opioids.  Assessment/Plan:  Principal Problem:   Altered mental status, unspecified Active Problems:   Multiple myeloma not having achieved remission (HCC)   Encephalopathy acute   Altered mental status   Acute psychosis ([place])   Neck pain   Acute psychosis suspect polypharmacy, resolved.    Seen by psychiatry.  Patient does have capacity at this time.  Thought to be secondary to Decadron received with infusion therapy (40 mg) during last infusion complicated by opioid regimen( fentanyl, oxycodone, xanax).  CT head was unremarkable, B12 TSH, ammonia, calcium, UA within normal limits.  UDS was positive for opioids.    Was  initially on IVC which was discontinued.  Currently stable.  Head, neck, shoulder pain likely secondary to multiple myeloma. PET scan from 05/24/2020 with noted C1 lesion .  No neurological deficits.  Radiation oncology on board. MRI of the C-spine showed possible fracture. Spoke with neurosurgery dr Dawley, recommend C-spine CT scan including angiogram. Communicated by epic chart to radiation oncology. Currently, on multiple regimens for pain control.  Try to minimize if possible.  Could not tolerate soft collar neck. Status post simulation 12/15.  Multiple myeloma.  Currently on chemotherapy as outpatient by Dr. Irene Limbo. holding Revlimid while inpatient. Radiation oncologyon board for possible radiation of the neck area.  DVT prophylaxis: enoxaparin (LOVENOX) injection 40 mg Start: 06/04/20 2200   Code Status: Full code  Family Communication: Patient does not wish me to talk with the nephew at this time  None today.  Status is: Inpatient  Remains inpatient appropriate because:IV treatments appropriate due to intensity of illness or inability to take PO, Inpatient level of care appropriate due to severity of illness and Further diagnostic testing, possible need for inpatient radiation, neurosurgical consultation.   Dispo: The patient is from: Home              Anticipated d/c is to: Undetermined at this time.              Anticipated d/c date is: 2 days              Patient currently is not medically stable to d/c.  Consultants:  Oncology  Radiation oncology  Psychiatry  Neurosurgery  Procedures:  None  Antibiotics:  . None  Anti-infectives (From admission, onward)   None  Subjective: Today, patient was seen and examined after MRI of the neck. Complains of pain in the neck. Denies any weakness on the legs arms.   Objective: Vitals:   06/12/20 2128 06/13/20 0520  BP: 129/64 133/68  Pulse: 97 96  Resp: 15 18  Temp: 98.8 F (37.1 C) 98.4 F (36.9 C)   SpO2: 96% 97%    Intake/Output Summary (Last 24 hours) at 06/13/2020 0729 Last data filed at 06/12/2020 2113 Gross per 24 hour  Intake 320 ml  Output --  Net 320 ml   Filed Weights   06/04/20 1209 06/13/20 0723  Weight: 71.7 kg 71.7 kg   Body mass index is 25.5 kg/m.   Physical Exam:  GENERAL: Patient is alert awake and oriented. Not in obvious distress.  Thinly built HENT: No scleral pallor or icterus. Pupils equally reactive to light. Oral mucosa is moist.  Tenderness over the nape with decreased range of movements NECK: is supple, no gross swelling noted. CHEST: Clear to auscultation. No crackles or wheezes.  Diminished breath sounds bilaterally. CVS: S1 and S2 heard, no murmur. Regular rate and rhythm.  ABDOMEN: Soft, non-tender, bowel sounds are present. EXTREMITIES: No edema. CNS: Cranial nerves are intact. No focal motor deficits.  Moving all extremities SKIN: warm and dry without rashes.  Data Review: I have personally reviewed the following laboratory data and studies,  CBC: Recent Labs  Lab 06/09/20 0816 06/13/20 0518  WBC 7.3 3.3*  HGB 12.6 10.5*  HCT 38.3 33.2*  MCV 93.9 96.2  PLT 243 803   Basic Metabolic Panel: Recent Labs  Lab 06/09/20 0816 06/09/20 1948 06/10/20 0504 06/11/20 0525 06/12/20 0625 06/13/20 0518  NA 136  --  136 136 138 140  K 3.0*  --  3.2* 3.8 4.3 4.6  CL 99  --  102 102 101 104  CO2 26  --  '25 25 27 30  ' GLUCOSE 128*  --  110* 116* 104* 106*  BUN 6*  --  8 9 7* 6*  CREATININE 0.78  --  0.67 0.73 0.82 0.78  CALCIUM 7.9*  --  7.7* 7.8* 8.2* 8.1*  MG  --  2.0  --  1.9 1.9 2.2   Liver Function Tests: No results for input(s): AST, ALT, ALKPHOS, BILITOT, PROT, ALBUMIN in the last 168 hours. No results for input(s): LIPASE, AMYLASE in the last 168 hours. No results for input(s): AMMONIA in the last 168 hours. Cardiac Enzymes: No results for input(s): CKTOTAL, CKMB, CKMBINDEX, TROPONINI in the last 168 hours. BNP (last 3  results) No results for input(s): BNP in the last 8760 hours.  ProBNP (last 3 results) No results for input(s): PROBNP in the last 8760 hours.  CBG: No results for input(s): GLUCAP in the last 168 hours. Recent Results (from the past 240 hour(s))  Resp Panel by RT-PCR (Flu A&B, Covid) Nasopharyngeal Swab     Status: None   Collection Time: 06/04/20  5:47 PM   Specimen: Nasopharyngeal Swab; Nasopharyngeal(NP) swabs in vial transport medium  Result Value Ref Range Status   SARS Coronavirus 2 by RT PCR NEGATIVE NEGATIVE Final    Comment: (NOTE) SARS-CoV-2 target nucleic acids are NOT DETECTED.  The SARS-CoV-2 RNA is generally detectable in upper respiratory specimens during the acute phase of infection. The lowest concentration of SARS-CoV-2 viral copies this assay can detect is 138 copies/mL. A negative result does not preclude SARS-Cov-2 infection and should not be used as the sole basis for treatment or other  patient management decisions. A negative result may occur with  improper specimen collection/handling, submission of specimen other than nasopharyngeal swab, presence of viral mutation(s) within the areas targeted by this assay, and inadequate number of viral copies(<138 copies/mL). A negative result must be combined with clinical observations, patient history, and epidemiological information. The expected result is Negative.  Fact Sheet for Patients:  EntrepreneurPulse.com.au  Fact Sheet for Healthcare Providers:  IncredibleEmployment.be  This test is no t yet approved or cleared by the [place] FDA and  has been authorized for detection and/or diagnosis of SARS-CoV-2 by FDA under an Emergency Use Authorization (EUA). This EUA will remain  in effect (meaning this test can be used) for the duration of the COVID-19 declaration under Section 564(b)(1) of the Act, 21 U.S.C.section 360bbb-3(b)(1), unless the authorization is terminated   or revoked sooner.       Influenza A by PCR NEGATIVE NEGATIVE Final   Influenza B by PCR NEGATIVE NEGATIVE Final    Comment: (NOTE) The Xpert Xpress SARS-CoV-2/FLU/RSV plus assay is intended as an aid in the diagnosis of influenza from Nasopharyngeal swab specimens and should not be used as a sole basis for treatment. Nasal washings and aspirates are unacceptable for Xpert Xpress SARS-CoV-2/FLU/RSV testing.  Fact Sheet for Patients: EntrepreneurPulse.com.au  Fact Sheet for Healthcare Providers: IncredibleEmployment.be  This test is not yet approved or cleared by the [place] FDA and has been authorized for detection and/or diagnosis of SARS-CoV-2 by FDA under an Emergency Use Authorization (EUA). This EUA will remain in effect (meaning this test can be used) for the duration of the COVID-19 declaration under Section 564(b)(1) of the Act, 21 U.S.C. section 360bbb-3(b)(1), unless the authorization is terminated or revoked.  Performed at [facility], [place] [address], [place], [place] [address]      Studies: No results found.    Flora Lipps, MD  [facility] 06/13/2020  If 7PM-7AM, please contact night-coverage

## 2020-06-13 NOTE — Progress Notes (Signed)
Patient off unit transported to [facility] for MRI under general anesthesia. VSS, not in any form of distress.

## 2020-06-13 NOTE — Progress Notes (Signed)
Patient refusing CT at this time, prefers to speak with her own Oncologist before having it done.  Hospitalist made aware/

## 2020-06-13 NOTE — Anesthesia Procedure Notes (Signed)
Procedure Name: Intubation Date/Time: 06/13/2020 8:43 AM Performed by: Reece Agar, CRNA Pre-anesthesia Checklist: Patient identified, Emergency Drugs available, Suction available and Patient being monitored Patient Re-evaluated:Patient Re-evaluated prior to induction Oxygen Delivery Method: Circle System Utilized Preoxygenation: Pre-oxygenation with 100% oxygen Induction Type: IV induction Ventilation: Mask ventilation without difficulty Laryngoscope Size: Glidescope and 3 Grade View: Grade I Tube type: Oral Number of attempts: 1 Airway Equipment and Method: Stylet and Oral airway Placement Confirmation: ETT inserted through vocal cords under direct vision,  positive ETCO2 and breath sounds checked- equal and bilateral Secured at: 21 cm Tube secured with: Tape Dental Injury: Teeth and Oropharynx as per pre-operative assessment

## 2020-06-14 ENCOUNTER — Encounter (HOSPITAL_COMMUNITY): Payer: Self-pay | Admitting: Radiology

## 2020-06-14 ENCOUNTER — Ambulatory Visit
Admit: 2020-06-14 | Discharge: 2020-06-14 | Disposition: A | Discharge status: Home / Self Care | Payer: Medicare Other | Attending: Radiation Oncology | Admitting: Radiation Oncology

## 2020-06-14 LAB — BASIC METABOLIC PANEL
Anion gap: 9 (ref 5–15)
BUN: 8 mg/dL (ref 8–23)
CO2: 25 mmol/L (ref 22–32)
Calcium: 7.9 mg/dL — ABNORMAL LOW (ref 8.9–10.3)
Chloride: 103 mmol/L (ref 98–111)
Creatinine, Ser: 0.72 mg/dL (ref 0.44–1.00)
GFR, Estimated: >60 mL/min (ref >60)
Glucose, Bld: 90 mg/dL (ref 70–99)
Potassium: 4.5 mmol/L (ref 3.5–5.1)
Sodium: 137 mmol/L (ref 135–145)

## 2020-06-14 LAB — CBC
HCT: 33.5 % — ABNORMAL LOW (ref 36.0–46.0)
Hemoglobin: 10.8 g/dL — ABNORMAL LOW (ref 12.0–15.0)
MCH: 30.7 pg (ref 26.0–34.0)
MCHC: 32.2 g/dL (ref 30.0–36.0)
MCV: 95.2 fL (ref 80.0–100.0)
Platelets: 260 10*3/uL (ref 150–400)
RBC: 3.52 MIL/uL — ABNORMAL LOW (ref 3.87–5.11)
RDW: 13.4 % (ref 11.5–15.5)
WBC: 3.2 10*3/uL — ABNORMAL LOW (ref 4.0–10.5)
nRBC: 0 % (ref 0.0–0.2)

## 2020-06-14 LAB — MAGNESIUM: Magnesium: 2.1 mg/dL (ref 1.7–2.4)

## 2020-06-14 MED ORDER — SENNOSIDES-DOCUSATE SODIUM 8.6-50 MG PO TABS
1.0000 | ORAL_TABLET | Freq: Two times a day (BID) | ORAL | Status: DC
  Administered 2020-06-14 – 2020-06-16 (×5): 1 via ORAL
  Filled 2020-06-14 (×5): qty 1

## 2020-06-14 MED ORDER — MORPHINE SULFATE (PF) 2 MG/ML IV SOLN
2.0000 mg | INTRAVENOUS | Status: DC | PRN
  Administered 2020-06-14 – 2020-06-18 (×12): 2 mg via INTRAVENOUS
  Filled 2020-06-14 (×12): qty 1

## 2020-06-14 NOTE — Progress Notes (Addendum)
Phoned [facility] and spoke with Ria Comment, RN caring for patient today. Explained that per Dr. Tammi Klippel the patient should be wearing an aspen cervical collar. Explained that the patient told Dr. Tammi Klippel she has one but it is uncomfortable. Questioned if an ortho tech needed to be notified for adjustment. Ria Comment, RN verbalized understanding. Also, Ria Comment, RN reports the patient has been refusing to keep the collar on despite it being placed properly.

## 2020-06-14 NOTE — Progress Notes (Signed)
Called yesterday for concern of Left C1 lateral mass lesion. Noted on MRI, patient now being treated for MM, had XRT planning performed.   I recommended CT/CTA neck for evaluation of boney destruction/VA involvement. Will follow once imaging is completed. Patient refused imaging per chart. Likely stable and can be followed conservatively. I believe radiating this lesion is a reasonable option for pain control.   Thank you for allowing me to participate in this patient's care.  Please do not hesitate to call with questions or concerns.   Elwin Sleight, [facility] Cell: [phone]

## 2020-06-14 NOTE — Care Management Important Message (Signed)
Important Message  Patient Details IM Letter given to the Patient. Name: Lisa Lester MRN: [id number] Date of Birth: 02-09-1946   Medicare Important Message Given:  Yes     [name] 06/14/2020, 11:01 AM

## 2020-06-14 NOTE — Progress Notes (Addendum)
HEMATOLOGY-ONCOLOGY PROGRESS NOTE  SUBJECTIVE: The patient is sitting up on the side of the bed today.  Reports that her neck pain is significantly better.  However, she is still not really moving her neck up and down or side to side.  Has a neck brace which she states causes pain so she is not wearing it.  Offers no other complaints today.  Oncology History  Multiple myeloma not having achieved remission ([place])  05/13/2020 Initial Diagnosis   Multiple myeloma not having achieved remission ([place])   05/28/2020 -  Chemotherapy   The patient had dexamethasone (DECADRON) tablet 20 mg, 20 mg, Oral,  Once, 1 of 6 cycles Administration: 20 mg (05/28/2020), 20 mg (05/31/2020) dexamethasone (DECADRON) 4 MG tablet, 1 of 1 cycle, Start date: 05/13/2020, End date: 06/04/2020 lenalidomide (REVLIMID) 15 MG capsule, 15 mg, Oral, Daily, 1 of 1 cycle, Start date: 05/15/2020, End date: 06/04/2020 bortezomib SQ (VELCADE) chemo injection (2.66m/mL concentration) 2.5 mg, 1.3 mg/m2 = 2.5 mg, Subcutaneous,  Once, 1 of 6 cycles Administration: 2.5 mg (05/28/2020), 2.5 mg (05/31/2020)  for chemotherapy treatment.       REVIEW OF SYSTEMS:   Constitutional: Denies fevers, chills  Eyes: Denies blurriness of vision Ears, nose, mouth, throat, and face: Denies mucositis or sore throat Respiratory: Denies cough, dyspnea or wheezes Cardiovascular: Denies palpitation, chest discomfort Gastrointestinal:  Denies nausea, heartburn or change in bowel habits Skin: Denies abnormal skin rashes Lymphatics: Denies new lymphadenopathy or easy bruising Neurological:Denies numbness, tingling or new weaknesses MSK: Pain improved, still with decreased range of motion of neck Behavioral/Psych: Mood is stable, no new changes  Extremities: No lower extremity edema All other systems were reviewed with the patient and are negative.  I have reviewed the past medical history, past surgical history, social history and family history with the  patient and they are unchanged from previous note.   PHYSICAL EXAMINATION: ECOG PERFORMANCE STATUS: 2 - Symptomatic, <50% confined to bed  Vitals:   06/13/20 2043 06/14/20 0455  BP: (!) 146/88 122/66  Pulse: (!) 107 100  Resp: 18 18  Temp: 98.2 F (36.8 C) 98.7 F (37.1 C)  SpO2: 96% 96%   Filed Weights   06/04/20 1209 06/13/20 0723  Weight: 71.7 kg 71.7 kg    Intake/Output from previous day: 12/16 0701 - 12/17 0700 In: 1580 [P.O.:780; I.V.:800] Out: 0   GENERAL: Chronically ill-appearing female, no distress SKIN: skin color, texture, turgor are normal, no rashes or significant lesions EYES: normal, Conjunctiva are pink and non-injected, sclera clear LUNGS: clear to auscultation and percussion with normal breathing effort HEART: regular rate & rhythm and no murmurs and no lower extremity edema ABDOMEN:abdomen soft, non-tender and normal bowel sounds Musculoskeletal: Tenderness to palpation to posterior head/neck NEURO: alert & oriented x 3 with fluent speech, no focal motor/sensory deficits  LABORATORY DATA:  I have reviewed the data as listed CMP Latest Ref Rng & Units 06/14/2020 06/13/2020 06/12/2020  Glucose 70 - 99 mg/dL 90 106(H) 104(H)  BUN 8 - 23 mg/dL 8 6(L) 7(L)  Creatinine 0.44 - 1.00 mg/dL 0.72 0.78 0.82  Sodium 135 - 145 mmol/L 137 140 138  Potassium 3.5 - 5.1 mmol/L 4.5 4.6 4.3  Chloride 98 - 111 mmol/L 103 104 101  CO2 22 - 32 mmol/L '25 30 27  ' Calcium 8.9 - 10.3 mg/dL 7.9(L) 8.1(L) 8.2(L)  Total Protein 6.5 - 8.1 g/dL - - -  Total Bilirubin 0.3 - 1.2 mg/dL - - -  Alkaline Phos 38 -  126 U/L - - -  AST 15 - 41 U/L - - -  ALT 0 - 44 U/L - - -    Lab Results  Component Value Date   WBC 3.2 (L) 06/14/2020   HGB 10.8 (L) 06/14/2020   HCT 33.5 (L) 06/14/2020   MCV 95.2 06/14/2020   PLT 260 06/14/2020   NEUTROABS 4.1 06/04/2020    CT HEAD WO CONTRAST  Result Date: 06/04/2020 CLINICAL DATA:  Delirium, increased confusion for 2 days.  Hallucinations. EXAM: CT HEAD WITHOUT CONTRAST TECHNIQUE: Contiguous axial images were obtained from the base of the skull through the vertex without intravenous contrast. COMPARISON:  None. FINDINGS: Brain: No evidence of large-territorial acute infarction. No parenchymal hemorrhage. No mass lesion. No extra-axial collection. No mass effect or midline shift. No hydrocephalus. Basilar cisterns are patent. Vascular: No hyperdense vessel. Skull: No acute fracture or focal lesion. Sinuses/Orbits: Paranasal sinuses and mastoid air cells are clear. The orbits are unremarkable. Other: None. IMPRESSION: No acute intracranial abnormality. Electronically Signed   By: Iven Finn M.D.   On: 06/04/2020 15:57   MR BRAIN WO CONTRAST  Result Date: 06/13/2020 CLINICAL DATA:  Mental status change, unknown cause acute psychosis, agitation, has multiple myeloma, unable to lie down flat due to persistent neck pain despite Pain management EXAM: MRI HEAD WITHOUT CONTRAST TECHNIQUE: Multiplanar, multiecho pulse sequences of the brain and surrounding structures were obtained without intravenous contrast. COMPARISON:  06/04/2020 head CT.  Concurrent MRI cervical spine. FINDINGS: Brain: No acute infarct. No intracranial hemorrhage. No midline shift, ventriculomegaly or extra-axial fluid collection. No mass lesion. Mild cerebral atrophy with ex vacuo dilatation. Minimal chronic microvascular ischemic changes. Vascular: Left V4 segment T2 hyperintense signal may reflect slow flow versus chronic occlusion. Remaining major intracranial flow voids proximally preserved. Left cerebellar developmental venous anomaly. Skull: Scattered subcentimeter T2 hyperintense calvarial lesions (for example 6:18) likely reflect myomatous sequela. Please see concurrent MRI cervical spine for findings below the foramen magnum. Sinuses/Orbits: Normal orbits. Mild ethmoid and left sphenoid sinus mucosal thickening. Trace bilateral mastoid free fluid. Other:  None. IMPRESSION: No acute intracranial process. Minimal chronic microvascular ischemic changes. Multifocal subcentimeter calvarial myomatous lesions. Left V4 segment slow flow versus chronic occlusion/high-grade narrowing. Electronically Signed   By: Primitivo Gauze M.D.   On: 06/13/2020 10:48   MR CERVICAL SPINE WO CONTRAST  Addendum Date: 06/13/2020   ADDENDUM REPORT: 06/13/2020 11:15 ADDENDUM: These results were called by telephone at the time of interpretation on 06/13/2020 at 11:12 am to provider Pokhrel, Corrie Mckusick, MD , who verbally acknowledged these results. Electronically Signed   By: Primitivo Gauze M.D.   On: 06/13/2020 11:15   Result Date: 06/13/2020 CLINICAL DATA:  acute psychosis, agitation, has multiple myeloma, unable to lie down flat due to persistent neck pain despite Pain management. 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 No intravenous contrast was administered. COMPARISON:  Concurrent MRI head. FINDINGS: Alignment: Normal. Vertebrae: Multifocal T1 hypointense/STIR hyperintense osseous lesions. Chronic T3 compression deformity with moderate height loss. Bone marrow edema involving the left lateral C1 mass with associated compression deformity likely reflects subacute pathologic fracture. Bone marrow edema extends to the left transverse foramen with redemonstration of T2 hyperintense left V4/V3 segment signal. Mild reactive perivertebral edema at this level. Remaining vertebral body heights are preserved. Cord: Normal signal and morphology. Posterior Fossa, vertebral arteries: Please see concurrent MRI head for findings above the foramen  magnum. Disc levels: Multilevel desiccation and disc space loss. C2-3: Disc osteophyte complex abutting the ventral cord and bilateral facet degenerative spurring. Prominent ligamentum flavum. Mild spinal canal and right  neural foraminal narrowing. Patent left neural foramen. C3-4: Disc osteophyte complex abutting the ventral cord with uncovertebral and facet degenerative spurring. Prominent ligamentum flavum. Mild spinal canal and bilateral neural foraminal narrowing. C4-5: Disc osteophyte complex with uncovertebral and facet hypertrophy. Mild spinal canal and moderate right and severe left neural foraminal narrowing. C5-6: Disc osteophyte complex with uncovertebral and facet hypertrophy. Superimposed right paracentral protrusion abutting the ventral cord. Mild spinal canal and moderate bilateral neural foraminal narrowing. C6-7: Disc osteophyte complex with uncovertebral and facet hypertrophy. Superimposed right foraminal protrusion. Mild spinal canal, moderate right and mild left neural foraminal narrowing. C7-T1: No significant disc bulge. Uncovertebral and facet degenerative spurring. Mild left neural foraminal narrowing. Patent spinal canal and right neural foramen. Paraspinal tissues: Negative. IMPRESSION: Multifocal myomatous lesions with subacute pathologic fracture deformity of the left lateral C1 mass likely involving the left transverse foramen. Left V3/V4 segment T2 hyperintense signal is concerning for high-grade narrowing/occlusion. Further evaluation with CTA neck is recommended to exclude vascular injury. Chronic T3 compression deformity with moderate height loss. Multilevel spondylosis as detailed above. Electronically Signed: By: Primitivo Gauze M.D. On: 06/13/2020 11:08   NM PET Image Initial (PI) Whole Body  Result Date: 05/24/2020 CLINICAL DATA:  Initial treatment strategy for multiple myeloma. EXAM: NUCLEAR MEDICINE PET WHOLE BODY TECHNIQUE: 7.8 mCi F-18 FDG was injected intravenously. Full-ring PET imaging was performed from the head to foot after the radiotracer. CT data was obtained and used for attenuation correction and anatomic localization. Fasting blood glucose: 110 mg/dl COMPARISON:  None.  FINDINGS: Mediastinal blood pool activity: SUV max 2.9 HEAD/ NECK: No hypermetabolic cervical lymph nodes are identified.There are no lesions of the pharyngeal mucosal space. Incidental CT findings: none CHEST: There are no hypermetabolic mediastinal, hilar or axillary lymph nodes. No hypermetabolic pulmonary activity or suspicious pulmonary nodularity. Incidental CT findings: Mild dependent atelectasis or scarring in both lungs. Mild atherosclerosis of the aorta, great vessels and coronary arteries. Bilateral breast implants are noted. ABDOMEN/PELVIS: There is no hypermetabolic activity within the liver, adrenal glands, spleen or pancreas. There is no hypermetabolic nodal activity. Incidental CT findings: Central 1.9 cm cyst in the right hepatic lobe. Mild aortic and branch vessel atherosclerosis. SKELETON: There are multiple hypermetabolic lytic osseous lesions within the axial skeleton. Within the left lateral mass of C1, there is a 2.2 cm lytic lesion involving the foramen transversarium which is significantly hypermetabolic (SUV max 19.3). There is a lytic lesion in the left aspect of the C4 vertebral body with an SUV max of 6.4. Multiple small lesions are present within the thoracolumbar spine. There are multiple hypermetabolic rib and sternal lesions, including a lower sternal lesion with an SUV max of 6.7 which is associated with an anteriorly displaced fracture, and a lesion involving the left 5th rib laterally with an SUV max of 9.4. Relatively limited involvement of the bony pelvis. Incidental CT findings: Status post L3 corpectomy and L2-4 posterior fusion. Probable T3 pathologic fracture. EXTREMITIES: There are multiple small hypermetabolic lesions within the scapula and clavicle bilaterally. In the distal right humeral shaft, there is a hypermetabolic lesion within SUV max of 3.9. There is a possible lesion in the anterior intertrochanteric region of the left femur versus adjacent soft tissue activity.  No other significant osseous lesions in the lower extremities. There is a small hypermetabolic  soft tissue mass anteriorly in the subcutaneous fat of the mid right thigh. This measures 1.3 x 1.0 cm on image 239/4 and has an SUV max of 3.4. Incidental CT findings: none IMPRESSION: 1. There are multiple hypermetabolic lytic osseous lesions consistent with multiple myeloma. The most prominent lesion is within the left lateral mass of C1 and this likely involves the foramen transversarium. Probable T3 and sternal pathologic fractures. 2. No evidence of solid visceral organ involvement. 3. Small hypermetabolic subcutaneous nodule anteriorly in the mid right thigh may reflect an incidental soft tissue neoplasm. Electronically Signed   By: Richardean Sale M.D.   On: 05/24/2020 09:22   CT Biopsy  Result Date: 05/27/2020 INDICATION: Multiple myeloma please perform CT-guided bone marrow biopsy for tissue diagnostic purposes. EXAM: CT-GUIDED BONE MARROW BIOPSY AND ASPIRATION MEDICATIONS: None ANESTHESIA/SEDATION: Fentanyl 100 mcg IV; Versed 2 mg IV Sedation Time: 10 Minutes; The patient was continuously monitored during the procedure by the interventional radiology nurse under my direct supervision. COMPLICATIONS: None immediate. PROCEDURE: Informed consent was obtained from the patient following an explanation of the procedure, risks, benefits and alternatives. The patient understands, agrees and consents for the procedure. All questions were addressed. A time out was performed prior to the initiation of the procedure. The patient was positioned prone and non-contrast localization CT was performed of the pelvis to demonstrate the iliac marrow spaces. The operative site was prepped and draped in the usual sterile fashion. Under sterile conditions and local anesthesia, a 22 gauge spinal needle was utilized for procedural planning. Next, an 11 gauge coaxial bone biopsy needle was advanced into the left iliac marrow space.  Needle position was confirmed with CT imaging. Initially, a bone marrow aspiration was performed. Next, a bone marrow biopsy was obtained with the 11 gauge outer bone marrow device. Samples were prepared with the cytotechnologist and deemed adequate. The needle was removed and superficial hemostasis was obtained with manual compression. A dressing was applied. The patient tolerated the procedure well without immediate post procedural complication. IMPRESSION: Successful CT guided left iliac bone marrow aspiration and core biopsy. Electronically Signed   By: Sandi Mariscal M.D.   On: 05/27/2020 11:31   CT BONE MARROW BIOPSY & ASPIRATION  Result Date: 05/27/2020 INDICATION: Multiple myeloma please perform CT-guided bone marrow biopsy for tissue diagnostic purposes. EXAM: CT-GUIDED BONE MARROW BIOPSY AND ASPIRATION MEDICATIONS: None ANESTHESIA/SEDATION: Fentanyl 100 mcg IV; Versed 2 mg IV Sedation Time: 10 Minutes; The patient was continuously monitored during the procedure by the interventional radiology nurse under my direct supervision. COMPLICATIONS: None immediate. PROCEDURE: Informed consent was obtained from the patient following an explanation of the procedure, risks, benefits and alternatives. The patient understands, agrees and consents for the procedure. All questions were addressed. A time out was performed prior to the initiation of the procedure. The patient was positioned prone and non-contrast localization CT was performed of the pelvis to demonstrate the iliac marrow spaces. The operative site was prepped and draped in the usual sterile fashion. Under sterile conditions and local anesthesia, a 22 gauge spinal needle was utilized for procedural planning. Next, an 11 gauge coaxial bone biopsy needle was advanced into the left iliac marrow space. Needle position was confirmed with CT imaging. Initially, a bone marrow aspiration was performed. Next, a bone marrow biopsy was obtained with the 11 gauge  outer bone marrow device. Samples were prepared with the cytotechnologist and deemed adequate. The needle was removed and superficial hemostasis was obtained with manual compression. A dressing  was applied. The patient tolerated the procedure well without immediate post procedural complication. IMPRESSION: Successful CT guided left iliac bone marrow aspiration and core biopsy. Electronically Signed   By: Sandi Mariscal M.D.   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 -Discussed 05/28/2020 MMP shows M Protein at 0.6 g/dL, K/L light chain ratio at 97.41 -Discussed 05/24/2020 PET/CT ([id number]) which revealed "1. There are multiple hypermetabolic lytic osseous lesions consistent with multiple myeloma. The most prominent lesion is within the left lateral mass of C1 and this likely involves the foramen transversarium. Probable T3 and sternal pathologic fractures." -MRI brain has been ordered but not yet complete secondary to significant pain.  She is scheduled to go later this week to [facility] to have MRI performed under anesthesia. -Bone marrow biopsy from 05/27/2020 showed 5% plasma cells, normal cytogenetics -Advised pt again that myeloma is treatable, but is not curable. -Radiation oncology following.  Consideration of palliative radiation to C1 is being considered versus involvement with neurosurgery.  Awaiting CTA neck which the patient now agrees to. -Recommend soft collar neck brace for neck pain. -Patient is currently on treatment with VRD.  This was started on 05/28/2020.  Appears to have missed day 8 and day 11 of her first cycle of treatment.  We will likely resume chemotherapy early next week. -Continue current pain medications. -We will need to resume baby aspirin  and acyclovir twice daily once chemotherapy has been restarted.  2) altered mental status -Etiology unclear - ?medications -Has been seen by psychiatry who recommends psych admission once medically stable -Overall cognition seems to be clearing up at this time. -Psych is reevaluated and no plans for psych admission.   LOS: 9 days   Mikey Bussing, DNP, AGPCNP-BC, AOCNP 06/14/20   ADDENDUM  .Patient was Personally and independently interviewed, examined and relevant elements of the history of present illness were reviewed in details and an assessment and plan was created. All elements of the patient's history of present illness , assessment and plan were discussed in details with Mikey Bussing, DNP, AGPCNP-BC, AOCNP. The above documentation reflects our combined findings assessment and plan.  Sullivan Lone MD MS

## 2020-06-14 NOTE — Progress Notes (Signed)
PROGRESS NOTE  Lisa Lester MVE:[id number] DOB: 01-04-1946 DOA: 06/04/2020 PCP: Lisa Coss, NP   LOS: 9 days   Brief narrative:  Lisa Lester is a 74 y.o. year old female with medical history of multiple myeloma (diagnosed 03/2020) currently undergoing outpatient chemotherapy who presented to the hospital on 12/7 with 3 days worsening confusion, agitation and paranoid thinking and behavior witnessed by family and outpatient oncologist, preventing infusion therapy.  She did have a PET scan on 05/24/2020 with multiple lytic lesions are stable multiple myeloma.  Initially in the ED, CT head scan was negative.  Hospital course was complicated by persistent paranoid thoughts, agitation, confusion with intermittent threats to leave [name].  Patient was placed under involuntary commitment on 12/8.  Patient complained of neck pain and was thought to be  due to C1 lesion  ("multiple hypermetabolic lytic osseous lesions consistent with multiple myeloma")from MM as evident from PET scan (05/24/2020). Patient  continues to have neck pain and oncology recommended that radiation oncology be consulted as inpatient given the severity of her pain. Mental status change was thought to be secondary to decadron from last infusion and polypharmacy from ativan and opioids.  Patient persisted to have the neck pain.  Seen by radiation oncology and underwent simulation and radiation treatment.  Assessment/Plan:  Principal Problem:   Altered mental status, unspecified Active Problems:   Multiple myeloma not having achieved remission (HCC)   Encephalopathy acute   Altered mental status   Acute psychosis ([place])   Neck pain   Acute psychosis suspect polypharmacy, resolved.    Seen by psychiatry.  CT head was unremarkable, B12 TSH, ammonia, calcium, UA within normal limits.  UDS was positive for opioids.    Was initially on IVC which was discontinued.  Currently stable.  Head, neck, shoulder  pain likely secondary to multiple myeloma.  Still complains of severe pain in the neck with difficulty moving.  PET scan from 05/24/2020 with noted C1 lesion .  No neurological deficits.  Radiation oncology on board. MRI of the C-spine showed possible fracture..  Could not tolerate soft collar neck. Status post simulation and initiation of radiation treatment.  I had a long conversation with the patient at this morning.  She initially refused her CT scan yesterday but she is willing to undergo CT scan.  CT scan of the spine will be done to see the stability of the fracture.  Multiple myeloma.  Currently on chemotherapy as outpatient by Dr. Irene Limbo. holding Revlimid while inpatient. Radiation oncologyon board for palliative radiation.  DVT prophylaxis: enoxaparin (LOVENOX) injection 40 mg Start: 06/04/20 2200   Code Status: Full code  Family Communication: None today.     Status is: Inpatient  Remains inpatient appropriate because: Inpatient level of care appropriate due to severity of illness and Further diagnostic testing,  inpatient palliative radiation, neurosurgical consultation.   Dispo: The patient is from: Home              Anticipated d/c is to: Undetermined at this time.              Anticipated d/c date is: 2 days              Patient currently is not medically stable to d/c.  Consultants:  Oncology  Radiation oncology  Psychiatry  Neurosurgery  Procedures:  None  Antibiotics:  . None  Anti-infectives (From admission, onward)   None      Subjective: Today, patient was seen and  examined at bedside.  Still complains of the neck pain.  She is willing to undergo CT scan today.  Denies any shortness of breath cough fever chills.  Denies weakness of the extremities.   Objective: Vitals:   06/13/20 2043 06/14/20 0455  BP: (!) 146/88 122/66  Pulse: (!) 107 100  Resp: 18 18  Temp: 98.2 F (36.8 C) 98.7 F (37.1 C)  SpO2: 96% 96%    Intake/Output Summary  (Last 24 hours) at 06/14/2020 1052 Last data filed at 06/13/2020 2001 Gross per 24 hour  Intake 880 ml  Output --  Net 880 ml   Filed Weights   06/04/20 1209 06/13/20 0723  Weight: 71.7 kg 71.7 kg   Body mass index is 25.5 kg/m.   Physical Exam:  General: Thinly built, not in obvious distress, alert awake and oriented, HENT:   No scleral pallor or icterus noted. Oral mucosa is moist.  Tenderness over the neck with decreased range of movement. Chest:  Clear breath sounds.  Diminished breath sounds bilaterally. No crackles or wheezes.  CVS: S1 &S2 heard. No murmur.  Regular rate and rhythm. Abdomen: Soft, nontender, nondistended.  Bowel sounds are heard.   Extremities: No cyanosis, clubbing or edema.  Peripheral pulses are palpable. Psych: Alert, awake and oriented,  CNS:  No cranial nerve deficits.  Moves all extremities, power equal in all extremities. Skin: Warm and dry.  No rashes noted.   Data Review: I have personally reviewed the following laboratory data and studies,  CBC: Recent Labs  Lab 06/09/20 0816 06/13/20 0518 06/14/20 0819  WBC 7.3 3.3* 3.2*  HGB 12.6 10.5* 10.8*  HCT 38.3 33.2* 33.5*  MCV 93.9 96.2 95.2  PLT 243 239 646   Basic Metabolic Panel: Recent Labs  Lab 06/09/20 1948 06/10/20 0504 06/11/20 0525 06/12/20 0625 06/13/20 0518 06/14/20 0559  NA  --  136 136 138 140 137  K  --  3.2* 3.8 4.3 4.6 4.5  CL  --  102 102 101 104 103  CO2  --  '25 25 27 30 25  ' GLUCOSE  --  110* 116* 104* 106* 90  BUN  --  8 9 7* 6* 8  CREATININE  --  0.67 0.73 0.82 0.78 0.72  CALCIUM  --  7.7* 7.8* 8.2* 8.1* 7.9*  MG 2.0  --  1.9 1.9 2.2 2.1   Liver Function Tests: No results for input(s): AST, ALT, ALKPHOS, BILITOT, PROT, ALBUMIN in the last 168 hours. No results for input(s): LIPASE, AMYLASE in the last 168 hours. No results for input(s): AMMONIA in the last 168 hours. Cardiac Enzymes: No results for input(s): CKTOTAL, CKMB, CKMBINDEX, TROPONINI in the last  168 hours. BNP (last 3 results) No results for input(s): BNP in the last 8760 hours.  ProBNP (last 3 results) No results for input(s): PROBNP in the last 8760 hours.  CBG: No results for input(s): GLUCAP in the last 168 hours. Recent Results (from the past 240 hour(s))  Resp Panel by RT-PCR (Flu A&B, Covid) Nasopharyngeal Swab     Status: None   Collection Time: 06/04/20  5:47 PM   Specimen: Nasopharyngeal Swab; Nasopharyngeal(NP) swabs in vial transport medium  Result Value Ref Range Status   SARS Coronavirus 2 by RT PCR NEGATIVE NEGATIVE Final    Comment: (NOTE) SARS-CoV-2 target nucleic acids are NOT DETECTED.  The SARS-CoV-2 RNA is generally detectable in upper respiratory specimens during the acute phase of infection. The lowest concentration of SARS-CoV-2 viral copies  this assay can detect is 138 copies/mL. A negative result does not preclude SARS-Cov-2 infection and should not be used as the sole basis for treatment or other patient management decisions. A negative result may occur with  improper specimen collection/handling, submission of specimen other than nasopharyngeal swab, presence of viral mutation(s) within the areas targeted by this assay, and inadequate number of viral copies(<138 copies/mL). A negative result must be combined with clinical observations, patient history, and epidemiological information. The expected result is Negative.  Fact Sheet for Patients:  EntrepreneurPulse.com.au  Fact Sheet for Healthcare Providers:  IncredibleEmployment.be  This test is no t yet approved or cleared by the [place] FDA and  has been authorized for detection and/or diagnosis of SARS-CoV-2 by FDA under an Emergency Use Authorization (EUA). This EUA will remain  in effect (meaning this test can be used) for the duration of the COVID-19 declaration under Section 564(b)(1) of the Act, 21 U.S.C.section 360bbb-3(b)(1), unless the  authorization is terminated  or revoked sooner.       Influenza A by PCR NEGATIVE NEGATIVE Final   Influenza B by PCR NEGATIVE NEGATIVE Final    Comment: (NOTE) The Xpert Xpress SARS-CoV-2/FLU/RSV plus assay is intended as an aid in the diagnosis of influenza from Nasopharyngeal swab specimens and should not be used as a sole basis for treatment. Nasal washings and aspirates are unacceptable for Xpert Xpress SARS-CoV-2/FLU/RSV testing.  Fact Sheet for Patients: EntrepreneurPulse.com.au  Fact Sheet for Healthcare Providers: IncredibleEmployment.be  This test is not yet approved or cleared by the [place] FDA and has been authorized for detection and/or diagnosis of SARS-CoV-2 by FDA under an Emergency Use Authorization (EUA). This EUA will remain in effect (meaning this test can be used) for the duration of the COVID-19 declaration under Section 564(b)(1) of the Act, 21 U.S.C. section 360bbb-3(b)(1), unless the authorization is terminated or revoked.  Performed at [facility], [place] [address], [place], [place] [address]      Studies: MR BRAIN WO CONTRAST  Result Date: 06/13/2020 CLINICAL DATA:  Mental status change, unknown cause acute psychosis, agitation, has multiple myeloma, unable to lie down flat due to persistent neck pain despite Pain management EXAM: MRI HEAD WITHOUT CONTRAST TECHNIQUE: Multiplanar, multiecho pulse sequences of the brain and surrounding structures were obtained without intravenous contrast. COMPARISON:  06/04/2020 head CT.  Concurrent MRI cervical spine. FINDINGS: Brain: No acute infarct. No intracranial hemorrhage. No midline shift, ventriculomegaly or extra-axial fluid collection. No mass lesion. Mild cerebral atrophy with ex vacuo dilatation. Minimal chronic microvascular ischemic changes. Vascular: Left V4 segment T2 hyperintense signal may reflect slow flow versus chronic occlusion. Remaining  major intracranial flow voids proximally preserved. Left cerebellar developmental venous anomaly. Skull: Scattered subcentimeter T2 hyperintense calvarial lesions (for example 6:18) likely reflect myomatous sequela. Please see concurrent MRI cervical spine for findings below the foramen magnum. Sinuses/Orbits: Normal orbits. Mild ethmoid and left sphenoid sinus mucosal thickening. Trace bilateral mastoid free fluid. Other: None. IMPRESSION: No acute intracranial process. Minimal chronic microvascular ischemic changes. Multifocal subcentimeter calvarial myomatous lesions. Left V4 segment slow flow versus chronic occlusion/high-grade narrowing. Electronically Signed   By: Primitivo Gauze M.D.   On: 06/13/2020 10:48   MR CERVICAL SPINE WO CONTRAST  Addendum Date: 06/13/2020   ADDENDUM REPORT: 06/13/2020 11:15 ADDENDUM: These results were called by telephone at the time of interpretation on 06/13/2020 at 11:12 am to provider Koryn Charlot, Corrie Mckusick, MD , who verbally acknowledged these results. Electronically Signed   By: Georga Kaufmann  [place] M.D.   On: 06/13/2020 11:15   Result Date: 06/13/2020 CLINICAL DATA:  acute psychosis, agitation, has multiple myeloma, unable to lie down flat due to persistent neck pain despite Pain management. 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 No intravenous contrast was administered. COMPARISON:  Concurrent MRI head. FINDINGS: Alignment: Normal. Vertebrae: Multifocal T1 hypointense/STIR hyperintense osseous lesions. Chronic T3 compression deformity with moderate height loss. Bone marrow edema involving the left lateral C1 mass with associated compression deformity likely reflects subacute pathologic fracture. Bone marrow edema extends to the left transverse foramen with redemonstration of T2 hyperintense left V4/V3 segment signal. Mild reactive  perivertebral edema at this level. Remaining vertebral body heights are preserved. Cord: Normal signal and morphology. Posterior Fossa, vertebral arteries: Please see concurrent MRI head for findings above the foramen magnum. Disc levels: Multilevel desiccation and disc space loss. C2-3: Disc osteophyte complex abutting the ventral cord and bilateral facet degenerative spurring. Prominent ligamentum flavum. Mild spinal canal and right neural foraminal narrowing. Patent left neural foramen. C3-4: Disc osteophyte complex abutting the ventral cord with uncovertebral and facet degenerative spurring. Prominent ligamentum flavum. Mild spinal canal and bilateral neural foraminal narrowing. C4-5: Disc osteophyte complex with uncovertebral and facet hypertrophy. Mild spinal canal and moderate right and severe left neural foraminal narrowing. C5-6: Disc osteophyte complex with uncovertebral and facet hypertrophy. Superimposed right paracentral protrusion abutting the ventral cord. Mild spinal canal and moderate bilateral neural foraminal narrowing. C6-7: Disc osteophyte complex with uncovertebral and facet hypertrophy. Superimposed right foraminal protrusion. Mild spinal canal, moderate right and mild left neural foraminal narrowing. C7-T1: No significant disc bulge. Uncovertebral and facet degenerative spurring. Mild left neural foraminal narrowing. Patent spinal canal and right neural foramen. Paraspinal tissues: Negative. IMPRESSION: Multifocal myomatous lesions with subacute pathologic fracture deformity of the left lateral C1 mass likely involving the left transverse foramen. Left V3/V4 segment T2 hyperintense signal is concerning for high-grade narrowing/occlusion. Further evaluation with CTA neck is recommended to exclude vascular injury. Chronic T3 compression deformity with moderate height loss. Multilevel spondylosis as detailed above. Electronically Signed: By: Primitivo Gauze M.D. On: 06/13/2020 11:08       Flora Lipps, MD  [facility] 06/14/2020  If 7PM-7AM, please contact night-coverage

## 2020-06-14 NOTE — Progress Notes (Signed)
Pt refusing CT this afternoon due to nausea. Willing to try again tomorrow. MD made aware. Will monitor patient.

## 2020-06-14 NOTE — Progress Notes (Signed)
Chaplain came for follow-up visit, 2 days after initial visit.  Patient continued to report neck pain and asked to me stand in sight-line so that she did not have to turn head.  Patient was pleasant, but also reported her level of pain was a 10, and sought a nurse to report that to her while chaplain was beside.  Patient lives currently with nephew but has her home in [place], where she hopes to return.  Chaplain provided support until staff arrived.  Rev. Tamsen Snider Pager [phone]

## 2020-06-15 ENCOUNTER — Inpatient Hospital Stay (HOSPITAL_COMMUNITY): Payer: Medicare Other

## 2020-06-15 IMAGING — CT CT ANGIO NECK
2 of 7 series · 10 of 33 positions shown · IV contrast (OMNIPAQUE)
Comparison: Cervical MRI [DATE]

CLINICAL DATA: Chronic neck pain. Possible C1 fracture. Multiple
myeloma.

EXAM:
CT ANGIOGRAPHY NECK
TECHNIQUE: Multidetector CT imaging of the neck was performed using the
standard protocol during bolus administration of intravenous
contrast. Multiplanar CT image reconstructions and MIPs were
obtained to evaluate the vascular anatomy. Carotid stenosis
measurements (when applicable) are obtained utilizing NASCET
criteria, using the distal internal carotid diameter as the
denominator.
CONTRAST:  100mL OMNIPAQUE IOHEXOL 350 MG/ML SOLN

[Series 5: cta neck · axial · 0.52mm/px · z∈[-196,-86]mm · 3 of 111 slices shown]
[im 28/111  soft-tissue]
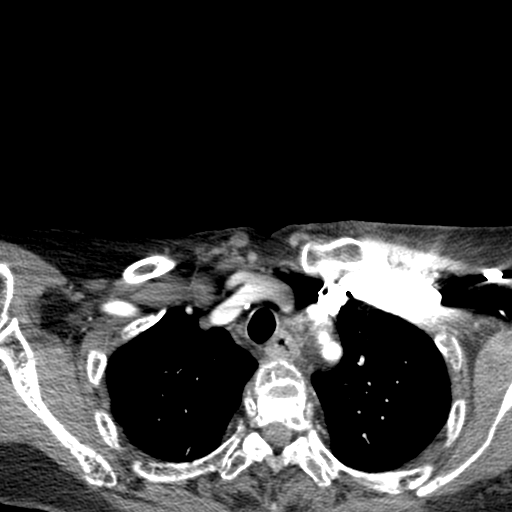
[im 56/111  soft-tissue]
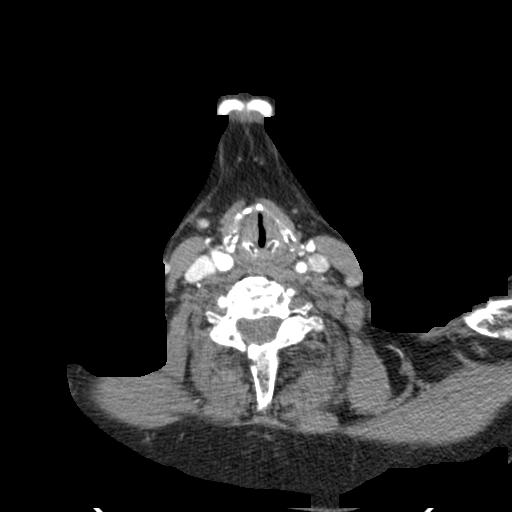
[im 83/111  soft-tissue]
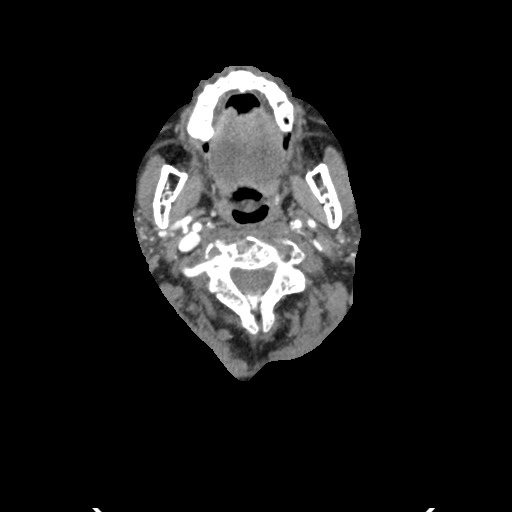

[Series 7: ax thin · axial · 0.39mm/px · z∈[-224,-54]mm · 7 of 228 slices shown]
[im 29/228  soft-tissue]
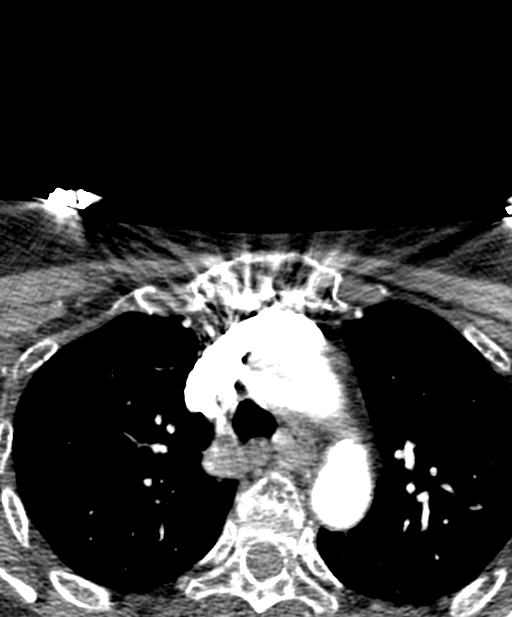
[im 57/228  bone]
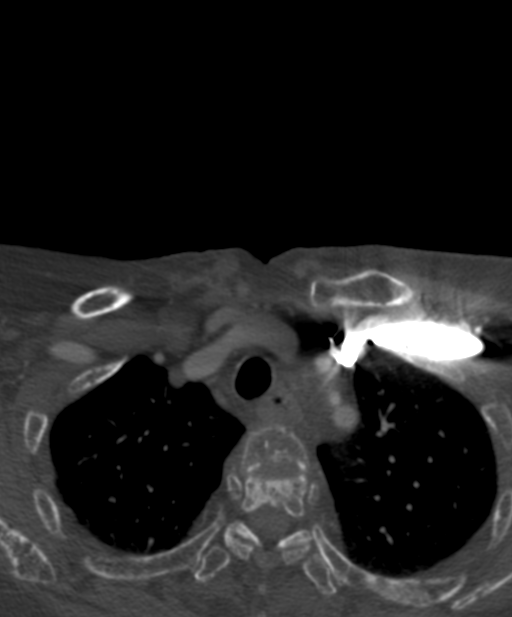
[im 86/228  soft-tissue]
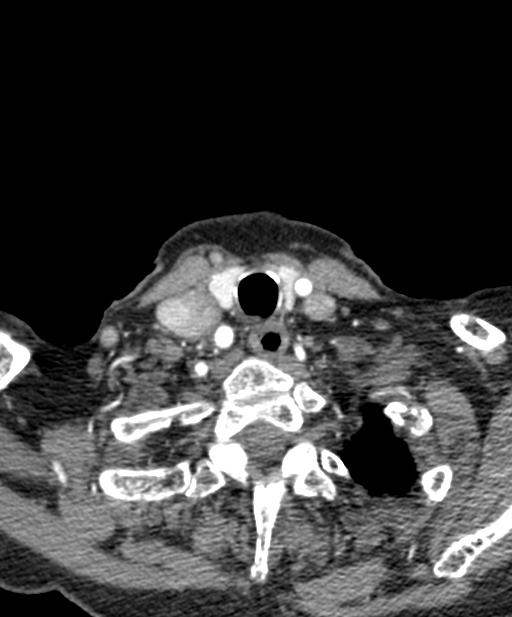
[im 114/228  bone]
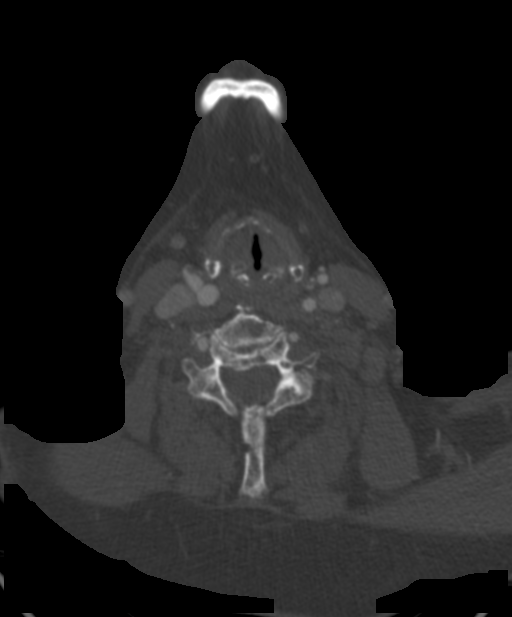
[im 142/228  soft-tissue]
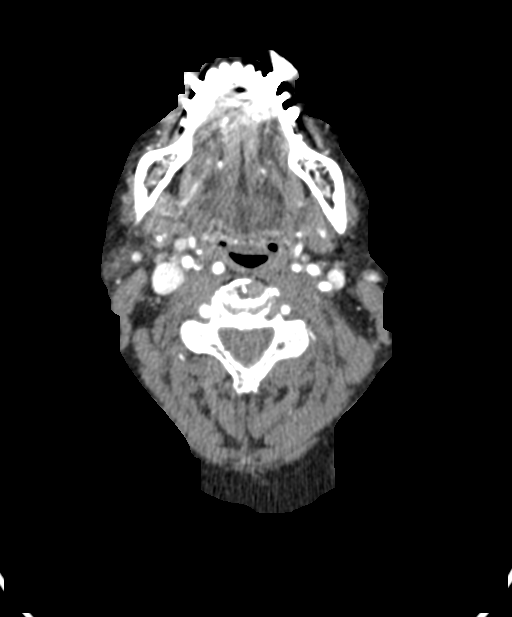
[im 171/228  bone]
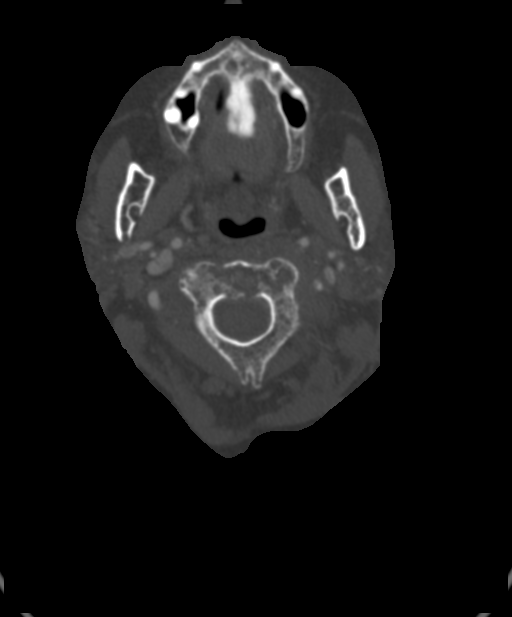
[im 199/228  soft-tissue]
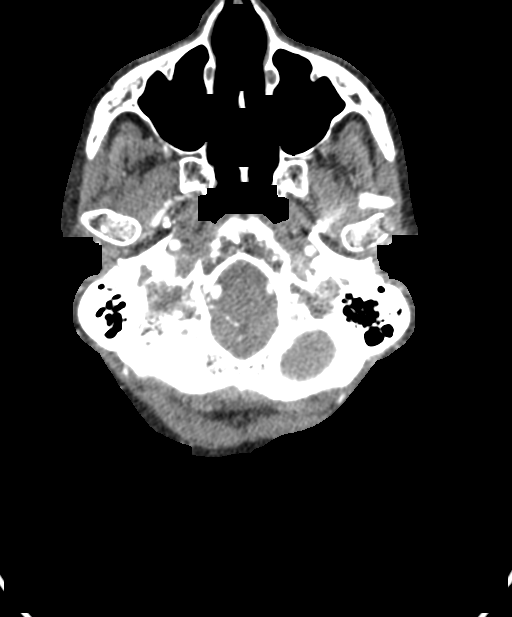

[10 of 33 positions shown; findings below may reference images not displayed]

FINDINGS: Aortic arch: Mild aortic atherosclerosis. Branching pattern is
normal, with the variant of the left vertebral artery arising
directly from the arch. No origin stenosis ease.

Right carotid system: Common carotid artery widely patent to the
bifurcation. Carotid bifurcation is normal. Cervical ICA is normal.

Left carotid system: Common carotid artery widely patent to the
bifurcation. The bifurcation is normal. Cervical ICA is normal.

Vertebral arteries: Right vertebral artery origin widely patent.
Left vertebral artery arises from the arch as noted above. Origin
widely patent. Both vertebral arteries are widely patent through the
cervical region. Tumor involvement on the left at C4 does involve
the transverse foramen slightly but does not cause any contour
change of the vertebral artery. Tumor involvement of the transverse
foramen on the left at C2 is associated with occlusion of the left
vertebral artery. There is some reconstituted flow within the left
vertebral artery at the foramen magnum.

Skeleton: Myeloma involvement throughout the cervical spine as will
be described on the cervical MRI. Compression fracture T3.

Other neck: No extra-spinal soft tissue mass or lymphadenopathy.

Upper chest: Lung apices are clear.
IMPRESSION: 1. Myeloma involvement throughout the cervical spine as will be
described on the cervical CT.
2. Tumor involvement of the transverse foramen on the left at C2 is
associated with occlusion of the left vertebral artery. There is
some reconstituted flow within the left vertebral artery at the
foramen magnum.
3. No carotid bifurcation disease.
4. Right vertebral artery widely patent to the basilar.
Reconstituted or retrograde flow within the distal left vertebral
artery back as far as the foramen magnum.

Aortic Atherosclerosis ([H3]-[H3]).

## 2020-06-15 MED ORDER — IOHEXOL 350 MG/ML SOLN
100.0000 mL | Freq: Once | INTRAVENOUS | Status: AC | PRN
  Administered 2020-06-15: 16:00:00 100 mL via INTRAVENOUS

## 2020-06-15 NOTE — Progress Notes (Signed)
Pt requests her pain and sedative medication before establishing PIV access for CT. Attempted x 1. RN aware.

## 2020-06-15 NOTE — Plan of Care (Signed)
  Problem: Clinical Measurements: Goal: Ability to maintain clinical measurements within normal limits will improve Outcome: Progressing Goal: Will remain free from infection Outcome: Progressing Goal: Diagnostic test results will improve Outcome: Progressing Goal: Cardiovascular complication will be avoided Outcome: Progressing   

## 2020-06-15 NOTE — Progress Notes (Signed)
PROGRESS NOTE  Lisa Lester PPI:[id number] DOB: [birth year] DOA: 06/04/2020 PCP: [name], NP   LOS: 10 days   Brief narrative:  Lisa Lester is a 74 y.o. year old female with medical history of multiple myeloma (diagnosed 03/2020) currently undergoing outpatient chemotherapy who presented to the hospital on 12/7 with 3 days worsening confusion, agitation and paranoid thinking and behavior witnessed by family and outpatient oncologist, preventing infusion therapy.  She did have a PET scan on 05/24/2020 with multiple lytic lesions are stable multiple myeloma.  Initially in the ED, CT head scan was negative.  Hospital course was complicated by persistent paranoid thoughts, agitation, confusion with intermittent threats to leave [place].  Patient was placed under involuntary commitment on 12/8.  Patient complained of neck pain and was thought to be  due to C1 lesion  ("multiple hypermetabolic lytic osseous lesions consistent with multiple myeloma")from MM as evident from PET scan (05/24/2020). Patient  continues to have neck pain and oncology recommended that radiation oncology be consulted as inpatient given the severity of her pain. Mental status change was thought to be secondary to decadron from last infusion and polypharmacy from ativan and opioids.  Patient persisted to have the neck pain.  Seen by radiation oncology and underwent simulation and radiation treatment. Neurosurgery was consulted due to questionable fracture in the C1 vertebra. Patient is awaiting for CT scan of the C-spine to assess for stability.  Assessment/Plan:  Principal Problem:   Altered mental status, unspecified Active Problems:   Multiple myeloma not having achieved remission (HCC)   Encephalopathy acute   Altered mental status   Acute psychosis ([place])   Neck pain   Acute psychosis suspect polypharmacy, resolved.    B12 TSH, ammonia, calcium, UA within normal limits.  UDS was positive for  opioids.   She was initially seen by psychiatry. Was initially on IVC which was discontinued.  Currently stable.  Head, neck, shoulder pain likely secondary to multiple myeloma.  Has  severe pain in the neck with difficulty moving.  PET scan from 05/24/2020 with noted C1 lesion .  No neurological deficits.  Radiation oncology on board. MRI of the C-spine showed possible fracture..  CT scan of the cervical spine and CTA has been recommended to the patient. Patient has initially refused CT scan read discharge that she will consider it. She refused yesterday and stated that she was nauseated. I have emphasized the need for getting it done for better evaluation. On soft collar today.  Multiple myeloma.  Currently on chemotherapy as outpatient by Dr. Irene Limbo.  Revlimid on hold while inpatient. Continue palliative radiation.  DVT prophylaxis: enoxaparin (LOVENOX) injection 40 mg Start: 06/04/20 2200   Code Status: Full code  Family Communication:   None today.     Status is: Inpatient  Remains inpatient appropriate because: Inpatient level of care appropriate due to severity of illness and Further diagnostic testing,  inpatient palliative radiation, neurosurgical consultation.   Dispo: The patient is from: Home              Anticipated d/c is to: Undetermined at this time.              Anticipated d/c date is: 2 days              Patient currently is not medically stable to d/c.  Consultants:  Oncology  Radiation oncology  Psychiatry  Neurosurgery  Procedures:  None  Antibiotics:  . None  Anti-infectives (From admission,  onward)   None      Subjective: Today, patient was seen and examined at bedside. Patient still complains of neck pain on soft collar. Denies nausea vomiting today.   Objective: Vitals:   06/14/20 2106 06/15/20 0443  BP: 123/64 132/69  Pulse: 93 91  Resp: 16 16  Temp: 98.2 F (36.8 C) 98.8 F (37.1 C)  SpO2: 94% 94%   No intake or output data  in the 24 hours ending 06/15/20 0731 Filed Weights   06/04/20 1209 06/13/20 0723  Weight: 71.7 kg 71.7 kg   Body mass index is 25.5 kg/m.   Physical Exam:  General: Thinly built, not in obvious distress, alert awake and oriented, mildly anxious HENT:   No scleral pallor or icterus noted. Oral mucosa is moist.  Tenderness over the neck with decreased range of movement. Chest:  Clear breath sounds.  Diminished breath sounds bilaterally. No crackles or wheezes.  CVS: S1 &S2 heard. No murmur.  Regular rate and rhythm. Abdomen: Soft, nontender, nondistended.  Bowel sounds are heard.   Extremities: No cyanosis, clubbing or edema.  Peripheral pulses are palpable. Psych: Alert, awake and oriented,  CNS:  No cranial nerve deficits.  Moves all extremities, power equal in all extremities. Skin: Warm and dry.  No rashes noted.   Data Review: I have personally reviewed the following laboratory data and studies,  CBC: Recent Labs  Lab 06/09/20 0816 06/13/20 0518 06/14/20 0819  WBC 7.3 3.3* 3.2*  HGB 12.6 10.5* 10.8*  HCT 38.3 33.2* 33.5*  MCV 93.9 96.2 95.2  PLT 243 239 720   Basic Metabolic Panel: Recent Labs  Lab 06/09/20 1948 06/10/20 0504 06/11/20 0525 06/12/20 0625 06/13/20 0518 06/14/20 0559  NA  --  136 136 138 140 137  K  --  3.2* 3.8 4.3 4.6 4.5  CL  --  102 102 101 104 103  CO2  --  '25 25 27 30 25  ' GLUCOSE  --  110* 116* 104* 106* 90  BUN  --  8 9 7* 6* 8  CREATININE  --  0.67 0.73 0.82 0.78 0.72  CALCIUM  --  7.7* 7.8* 8.2* 8.1* 7.9*  MG 2.0  --  1.9 1.9 2.2 2.1   Liver Function Tests: No results for input(s): AST, ALT, ALKPHOS, BILITOT, PROT, ALBUMIN in the last 168 hours. No results for input(s): LIPASE, AMYLASE in the last 168 hours. No results for input(s): AMMONIA in the last 168 hours. Cardiac Enzymes: No results for input(s): CKTOTAL, CKMB, CKMBINDEX, TROPONINI in the last 168 hours. BNP (last 3 results) No results for input(s): BNP in the last 8760  hours.  ProBNP (last 3 results) No results for input(s): PROBNP in the last 8760 hours.  CBG: No results for input(s): GLUCAP in the last 168 hours. No results found for this or any previous visit (from the past 240 hour(s)).   Studies: MR BRAIN WO CONTRAST  Result Date: 06/13/2020 CLINICAL DATA:  Mental status change, unknown cause acute psychosis, agitation, has multiple myeloma, unable to lie down flat due to persistent neck pain despite Pain management EXAM: MRI HEAD WITHOUT CONTRAST TECHNIQUE: Multiplanar, multiecho pulse sequences of the brain and surrounding structures were obtained without intravenous contrast. COMPARISON:  06/04/2020 head CT.  Concurrent MRI cervical spine. FINDINGS: Brain: No acute infarct. No intracranial hemorrhage. No midline shift, ventriculomegaly or extra-axial fluid collection. No mass lesion. Mild cerebral atrophy with ex vacuo dilatation. Minimal chronic microvascular ischemic changes. Vascular: Left V4 segment  T2 hyperintense signal may reflect slow flow versus chronic occlusion. Remaining major intracranial flow voids proximally preserved. Left cerebellar developmental venous anomaly. Skull: Scattered subcentimeter T2 hyperintense calvarial lesions (for example 6:18) likely reflect myomatous sequela. Please see concurrent MRI cervical spine for findings below the foramen magnum. Sinuses/Orbits: Normal orbits. Mild ethmoid and left sphenoid sinus mucosal thickening. Trace bilateral mastoid free fluid. Other: None. IMPRESSION: No acute intracranial process. Minimal chronic microvascular ischemic changes. Multifocal subcentimeter calvarial myomatous lesions. Left V4 segment slow flow versus chronic occlusion/high-grade narrowing. Electronically Signed   By: Primitivo Gauze M.D.   On: 06/13/2020 10:48   MR CERVICAL SPINE WO CONTRAST  Addendum Date: 06/13/2020   ADDENDUM REPORT: 06/13/2020 11:15 ADDENDUM: These results were called by telephone at the time of  interpretation on 06/13/2020 at 11:12 am to provider Narya Beavin, Corrie Mckusick, MD , who verbally acknowledged these results. Electronically Signed   By: Primitivo Gauze M.D.   On: 06/13/2020 11:15   Result Date: 06/13/2020 CLINICAL DATA:  acute psychosis, agitation, has multiple myeloma, unable to lie down flat due to persistent neck pain despite Pain management. 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 No intravenous contrast was administered. COMPARISON:  Concurrent MRI head. FINDINGS: Alignment: Normal. Vertebrae: Multifocal T1 hypointense/STIR hyperintense osseous lesions. Chronic T3 compression deformity with moderate height loss. Bone marrow edema involving the left lateral C1 mass with associated compression deformity likely reflects subacute pathologic fracture. Bone marrow edema extends to the left transverse foramen with redemonstration of T2 hyperintense left V4/V3 segment signal. Mild reactive perivertebral edema at this level. Remaining vertebral body heights are preserved. Cord: Normal signal and morphology. Posterior Fossa, vertebral arteries: Please see concurrent MRI head for findings above the foramen magnum. Disc levels: Multilevel desiccation and disc space loss. C2-3: Disc osteophyte complex abutting the ventral cord and bilateral facet degenerative spurring. Prominent ligamentum flavum. Mild spinal canal and right neural foraminal narrowing. Patent left neural foramen. C3-4: Disc osteophyte complex abutting the ventral cord with uncovertebral and facet degenerative spurring. Prominent ligamentum flavum. Mild spinal canal and bilateral neural foraminal narrowing. C4-5: Disc osteophyte complex with uncovertebral and facet hypertrophy. Mild spinal canal and moderate right and severe left neural foraminal narrowing. C5-6: Disc osteophyte complex with uncovertebral and  facet hypertrophy. Superimposed right paracentral protrusion abutting the ventral cord. Mild spinal canal and moderate bilateral neural foraminal narrowing. C6-7: Disc osteophyte complex with uncovertebral and facet hypertrophy. Superimposed right foraminal protrusion. Mild spinal canal, moderate right and mild left neural foraminal narrowing. C7-T1: No significant disc bulge. Uncovertebral and facet degenerative spurring. Mild left neural foraminal narrowing. Patent spinal canal and right neural foramen. Paraspinal tissues: Negative. IMPRESSION: Multifocal myomatous lesions with subacute pathologic fracture deformity of the left lateral C1 mass likely involving the left transverse foramen. Left V3/V4 segment T2 hyperintense signal is concerning for high-grade narrowing/occlusion. Further evaluation with CTA neck is recommended to exclude vascular injury. Chronic T3 compression deformity with moderate height loss. Multilevel spondylosis as detailed above. Electronically Signed: By: Primitivo Gauze M.D. On: 06/13/2020 11:08      Flora Lipps, MD  [facility] 06/15/2020  If 7PM-7AM, please contact night-coverage

## 2020-06-15 NOTE — Plan of Care (Signed)
  Problem: Clinical Measurements: Goal: Ability to maintain clinical measurements within normal limits will improve Outcome: Progressing Goal: Will remain free from infection Outcome: Progressing Goal: Cardiovascular complication will be avoided Outcome: Progressing   

## 2020-06-16 ENCOUNTER — Encounter (HOSPITAL_COMMUNITY): Payer: Self-pay | Admitting: Internal Medicine

## 2020-06-16 LAB — CBC
HCT: 33.2 % — ABNORMAL LOW (ref 36.0–46.0)
Hemoglobin: 10.5 g/dL — ABNORMAL LOW (ref 12.0–15.0)
MCH: 30.6 pg (ref 26.0–34.0)
MCHC: 31.6 g/dL (ref 30.0–36.0)
MCV: 96.8 fL (ref 80.0–100.0)
Platelets: 253 10*3/uL (ref 150–400)
RBC: 3.43 MIL/uL — ABNORMAL LOW (ref 3.87–5.11)
RDW: 13.4 % (ref 11.5–15.5)
WBC: 2.4 10*3/uL — ABNORMAL LOW (ref 4.0–10.5)
nRBC: 0 % (ref 0.0–0.2)

## 2020-06-16 LAB — BASIC METABOLIC PANEL
Anion gap: 9 (ref 5–15)
BUN: 5 mg/dL — ABNORMAL LOW (ref 8–23)
CO2: 26 mmol/L (ref 22–32)
Calcium: 7.9 mg/dL — ABNORMAL LOW (ref 8.9–10.3)
Chloride: 102 mmol/L (ref 98–111)
Creatinine, Ser: 0.77 mg/dL (ref 0.44–1.00)
GFR, Estimated: >60 mL/min (ref >60)
Glucose, Bld: 95 mg/dL (ref 70–99)
Potassium: 4.1 mmol/L (ref 3.5–5.1)
Sodium: 137 mmol/L (ref 135–145)

## 2020-06-16 LAB — MAGNESIUM: Magnesium: 2 mg/dL (ref 1.7–2.4)

## 2020-06-16 MED ORDER — SENNOSIDES-DOCUSATE SODIUM 8.6-50 MG PO TABS
2.0000 | ORAL_TABLET | Freq: Two times a day (BID) | ORAL | Status: DC
  Administered 2020-06-16 – 2020-06-18 (×4): 2 via ORAL
  Filled 2020-06-16 (×4): qty 2

## 2020-06-16 MED ORDER — MAGNESIUM HYDROXIDE 400 MG/5ML PO SUSP
30.0000 mL | Freq: Two times a day (BID) | ORAL | Status: AC
  Administered 2020-06-16 (×2): 30 mL via ORAL
  Filled 2020-06-16 (×3): qty 30

## 2020-06-16 MED ORDER — ALUM & MAG HYDROXIDE-SIMETH 200-200-20 MG/5ML PO SUSP
30.0000 mL | Freq: Four times a day (QID) | ORAL | Status: DC | PRN
  Administered 2020-06-16: 21:00:00 30 mL via ORAL
  Filled 2020-06-16: qty 30

## 2020-06-16 NOTE — Progress Notes (Signed)
PROGRESS NOTE  Lisa Lester:[id number] DOB: 02/25/1946 DOA: 06/04/2020 PCP: [name], NP   LOS: 11 days   Brief narrative:  Lisa Lester is a 74 y.o. year old female with medical history of multiple myeloma (diagnosed 03/2020) currently undergoing outpatient chemotherapy who presented to the hospital on 12/7 with 3 days worsening confusion, agitation and paranoid thinking and behavior witnessed by family and outpatient oncologist, preventing infusion therapy.  She did have a PET scan on 05/24/2020 with multiple lytic lesions are stable multiple myeloma.  Initially in the ED, CT head scan was negative. Hospital course was complicated by persistent paranoid thoughts, agitation, confusion.  Patient was placed under involuntary commitment on 12/8.  Patient complained of neck pain and was thought to be  due to C1 lesion  ("multiple hypermetabolic lytic osseous lesions consistent with multiple myeloma")from MM as evident from PET scan (05/24/2020). Patient  continues to have neck pain and oncology recommended that radiation oncology be consulted as inpatient given the severity of her pain. Mental status change was thought to be secondary to decadron from last infusion and polypharmacy from ativan and opioids.  Patient persisted to have the neck pain.  Seen by radiation oncology and underwent simulation and radiation treatment. Neurosurgery was consulted due to questionable fracture in the C1 vertebra. Patient underwent CT scan of the C-spine to assess for stability.  Assessment/Plan:  Principal Problem:   Altered mental status, unspecified Active Problems:   Multiple myeloma not having achieved remission (HCC)   Encephalopathy acute   Altered mental status   Acute psychosis ([place])   Neck pain   Acute psychosis suspect polypharmacy, resolved.    B12 TSH, ammonia, calcium, UA within normal limits.  UDS was positive for opioids.   She was initially seen by psychiatry. Was initially on  IVC which was discontinued.  Currently stable.  Try to limit sedative, hypnotics if possible.  Head, neck, shoulder pain likely secondary to multiple myeloma.  Has  severe pain in the neck with difficulty moving.  PET scan from 05/24/2020 with noted C1 lesion .  No neurological deficits.  Radiation oncology on board. MRI of the C-spine showed possible fracture..  CT scan of the cervical spine and CTA was performed which showed multiple lytic lesions for findings.  Continue radiation treatment and analgesia..  Multiple myeloma.  Currently on chemotherapy as outpatient by Dr. Irene Limbo.  Revlimid on hold while inpatient. Continue palliative radiation.  DVT prophylaxis: enoxaparin (LOVENOX) injection 40 mg Start: 06/04/20 2200   Code Status: Full code  Family Communication:  None today.     Status is: Inpatient  Remains inpatient appropriate because: Inpatient level of care appropriate due to severity of illness and Further diagnostic testing,  inpatient palliative radiation,   Dispo: The patient is from: Home              Anticipated d/c is to: Undetermined at this time.              Anticipated d/c date is: 2 days depending upon oncology follow-up/line for radiation treatment.              Patient currently is not medically stable to d/c.  Consultants:  Oncology  Radiation oncology  Psychiatry  Neurosurgery  Procedures:  None  Antibiotics:  . None  Anti-infectives (From admission, onward)   None     Subjective: Today, patient was seen and examined at bedside.  Still complains of neck pain especially on moving.  Denies  any weakness in the extremities.  No bowel or bladder dysfunction.  No shortness of breath cough fever.   Objective: Vitals:   06/15/20 2045 06/16/20 0446  BP: 130/64 122/61  Pulse: (!) 108 99  Resp: 20 20  Temp: 99.4 F (37.4 C) 98.3 F (36.8 C)  SpO2: 99% 96%    Intake/Output Summary (Last 24 hours) at 06/16/2020 0959 Last data filed at  06/16/2020 0159 Gross per 24 hour  Intake 600 ml  Output --  Net 600 ml   Filed Weights   06/04/20 1209 06/13/20 0723  Weight: 71.7 kg 71.7 kg   Body mass index is 25.5 kg/m.   Physical Exam:  General: Thinly built, not in obvious distress, alert awake and oriented, mildly anxious, on neck soft collar. HENT:   No scleral pallor or icterus noted. Oral mucosa is moist.  Tenderness over the neck on soft collar. Chest:  Clear breath sounds.  Diminished breath sounds bilaterally. No crackles or wheezes.  CVS: S1 &S2 heard. No murmur.  Regular rate and rhythm. Abdomen: Soft, nontender, nondistended.  Bowel sounds are heard.   Extremities: No cyanosis, clubbing or edema.  Peripheral pulses are palpable. Psych: Alert, awake and oriented, CNS:  No cranial nerve deficits.  Power equal in all extremities.  Moving all extremities. Skin: Warm and dry.  No rashes noted.  Data Review: I have personally reviewed the following laboratory data and studies,  CBC: Recent Labs  Lab 06/13/20 0518 06/14/20 0819 06/16/20 0537  WBC 3.3* 3.2* 2.4*  HGB 10.5* 10.8* 10.5*  HCT 33.2* 33.5* 33.2*  MCV 96.2 95.2 96.8  PLT 239 260 333   Basic Metabolic Panel: Recent Labs  Lab 06/11/20 0525 06/12/20 0625 06/13/20 0518 06/14/20 0559 06/16/20 0537  NA 136 138 140 137 137  K 3.8 4.3 4.6 4.5 4.1  CL 102 101 104 103 102  CO2 '25 27 30 25 26  ' GLUCOSE 116* 104* 106* 90 95  BUN 9 7* 6* 8 5*  CREATININE 0.73 0.82 0.78 0.72 0.77  CALCIUM 7.8* 8.2* 8.1* 7.9* 7.9*  MG 1.9 1.9 2.2 2.1 2.0   Liver Function Tests: No results for input(s): AST, ALT, ALKPHOS, BILITOT, PROT, ALBUMIN in the last 168 hours. No results for input(s): LIPASE, AMYLASE in the last 168 hours. No results for input(s): AMMONIA in the last 168 hours. Cardiac Enzymes: No results for input(s): CKTOTAL, CKMB, CKMBINDEX, TROPONINI in the last 168 hours. BNP (last 3 results) No results for input(s): BNP in the last 8760 hours.  ProBNP  (last 3 results) No results for input(s): PROBNP in the last 8760 hours.  CBG: No results for input(s): GLUCAP in the last 168 hours. No results found for this or any previous visit (from the past 240 hour(s)).   Studies: CT ANGIO NECK W OR WO CONTRAST  Result Date: 06/15/2020 CLINICAL DATA:  Chronic neck pain. Possible C1 fracture. Multiple myeloma. EXAM: CT ANGIOGRAPHY NECK TECHNIQUE: Multidetector CT imaging of the neck was performed using the standard protocol during bolus administration of intravenous contrast. Multiplanar CT image reconstructions and MIPs were obtained to evaluate the vascular anatomy. Carotid stenosis measurements (when applicable) are obtained utilizing NASCET criteria, using the distal internal carotid diameter as the denominator. CONTRAST:  113m OMNIPAQUE IOHEXOL 350 MG/ML SOLN COMPARISON:  Cervical MRI 06/13/2020 FINDINGS: Aortic arch: Mild aortic atherosclerosis. Branching pattern is normal, with the variant of the left vertebral artery arising directly from the arch. No origin stenosis ease. Right carotid system: Common  carotid artery widely patent to the bifurcation. Carotid bifurcation is normal. Cervical ICA is normal. Left carotid system: Common carotid artery widely patent to the bifurcation. The bifurcation is normal. Cervical ICA is normal. Vertebral arteries: Right vertebral artery origin widely patent. Left vertebral artery arises from the arch as noted above. Origin widely patent. Both vertebral arteries are widely patent through the cervical region. Tumor involvement on the left at C4 does involve the transverse foramen slightly but does not cause any contour change of the vertebral artery. Tumor involvement of the transverse foramen on the left at C2 is associated with occlusion of the left vertebral artery. There is some reconstituted flow within the left vertebral artery at the foramen magnum. Skeleton: Myeloma involvement throughout the cervical spine as will  be described on the cervical MRI. Compression fracture T3. Other neck: No extra-spinal soft tissue mass or lymphadenopathy. Upper chest: Lung apices are clear. IMPRESSION: 1. Myeloma involvement throughout the cervical spine as will be described on the cervical CT. 2. Tumor involvement of the transverse foramen on the left at C2 is associated with occlusion of the left vertebral artery. There is some reconstituted flow within the left vertebral artery at the foramen magnum. 3. No carotid bifurcation disease. 4. Right vertebral artery widely patent to the basilar. Reconstituted or retrograde flow within the distal left vertebral artery back as far as the foramen magnum. Aortic Atherosclerosis (ICD10-I70.0). Electronically Signed   By: Nelson Chimes M.D.   On: 06/15/2020 16:14   CT C-SPINE NO CHARGE  Result Date: 06/15/2020 CLINICAL DATA:  Multiple myeloma. Left vertebral artery occlusion at C2. C1 fracture previously shown by cervical MRI. EXAM: CT CERVICAL SPINE WITHOUT CONTRAST TECHNIQUE: Multidetector CT imaging of the cervical spine was performed without intravenous contrast. Multiplanar CT image reconstructions were also generated. COMPARISON:  CT angiography same day.  Cervical MRI 2 days ago. FINDINGS: Alignment: Normal Skull base and vertebrae: Lytic lesions throughout the skeleton consistent with diffuse multiple myeloma. Major findings will be described at each level below. Pathologic fracture the left lateral mass of C1. Soft tissues and spinal canal: No soft tissue neck lesion. No evidence of spinal canal compromise. Disc levels: Skull base and foramen magnum: Small lytic lesion at the tip of the clivus. Foramen magnum sufficiently patent. C1-2: Lytic destruction with pathologic fracture of the left lateral mass of C1 and the left side of the anterior arch. Extensive lytic change within the C2 vertebra which could place the patient at risk of pathologic fracture. Tumor involves the transverse foramen  on the left where there is occlusion of the left vertebral artery shown by CT angiography. C3: Lytic lesions, most pronounced in the left anterior vertebral body. No canal compromise. C4: Lytic disease most pronounced on the left, with tumor extending into the transverse foramen and surrounding the left vertebral artery. No vertebral artery constriction or occlusion at this level. C5: Small lytic lesions. C6: Small lytic lesions. C7: Small lytic lesions. T1: Large lytic lesion of the anterior vertebral body. No fracture. No encroachment upon the neural structures. T2: Small lytic lesions. T3: Chronic pathologic compression fracture. No encroachment upon the canal or foramina. Upper chest: Lung apices are clear. Other: None IMPRESSION: 1. Lytic lesions throughout the skeleton consistent with diffuse multiple myeloma. 2. Pathologic fractures of the left lateral mass of C1 and the left side of the anterior arch of C1. Extensive lytic change within the C2 vertebra which could place the patient at risk of pathologic fracture of C2.  Tumor involves the left transverse foramen at C2 in the left vertebral artery is occluded in this location. Tumor also surrounds the left vertebral artery in the transverse foramen C4 but there is no narrowing or near occlusion at this level. 3. Multiple lytic lesions throughout the skeleton as outlined in detail above. 4. Chronic pathologic compression fracture at T3. No encroachment upon the canal or foramina. Electronically Signed   By: Nelson Chimes M.D.   On: 06/15/2020 16:22      Flora Lipps, MD  [facility] 06/16/2020  If 7PM-7AM, please contact night-coverage

## 2020-06-16 NOTE — Plan of Care (Signed)
  Problem: Clinical Measurements: Goal: Ability to maintain clinical measurements within normal limits will improve Outcome: Completed/Met

## 2020-06-17 ENCOUNTER — Other Ambulatory Visit: Payer: Medicare Other

## 2020-06-17 ENCOUNTER — Ambulatory Visit: Payer: Medicare Other

## 2020-06-17 ENCOUNTER — Ambulatory Visit: Payer: Medicare Other | Admitting: Hematology

## 2020-06-17 ENCOUNTER — Ambulatory Visit
Admit: 2020-06-17 | Discharge: 2020-06-17 | Disposition: A | Discharge status: Home / Self Care | Payer: Medicare Other | Attending: Radiation Oncology | Admitting: Radiation Oncology

## 2020-06-17 MED ORDER — ASPIRIN 81 MG PO CHEW
81.0000 mg | CHEWABLE_TABLET | Freq: Every day | ORAL | Status: DC
  Administered 2020-06-18: 10:00:00 81 mg via ORAL
  Filled 2020-06-17: qty 1

## 2020-06-17 MED ORDER — LENALIDOMIDE 15 MG PO CAPS: 15.0000 mg | ORAL_CAPSULE | Freq: Every day | ORAL | Status: DC

## 2020-06-17 MED ORDER — PROCHLORPERAZINE MALEATE 10 MG PO TABS
10.0000 mg | ORAL_TABLET | Freq: Four times a day (QID) | ORAL | Status: DC | PRN
  Administered 2020-06-17: 19:00:00 10 mg via ORAL
  Filled 2020-06-17: qty 1

## 2020-06-17 MED ORDER — ACYCLOVIR 200 MG PO CAPS
400.0000 mg | ORAL_CAPSULE | Freq: Two times a day (BID) | ORAL | Status: DC
  Administered 2020-06-18: 10:00:00 400 mg via ORAL
  Filled 2020-06-17 (×2): qty 2

## 2020-06-17 MED ORDER — HYDROCODONE-ACETAMINOPHEN 7.5-325 MG PO TABS
1.0000 | ORAL_TABLET | ORAL | Status: DC | PRN
Start: 2020-06-17 — End: 2020-06-19
  Administered 2020-06-17 – 2020-06-18 (×3): 1 via ORAL
  Filled 2020-06-17 (×3): qty 1

## 2020-06-17 MED ORDER — MAGNESIUM HYDROXIDE 400 MG/5ML PO SUSP
30.0000 mL | Freq: Once | ORAL | Status: AC
  Administered 2020-06-17: 13:00:00 30 mL via ORAL
  Filled 2020-06-17: qty 30

## 2020-06-17 MED ORDER — ENOXAPARIN SODIUM 40 MG/0.4ML ~~LOC~~ SOLN
40.0000 mg | SUBCUTANEOUS | Status: DC
  Administered 2020-06-17 – 2020-06-18 (×2): 40 mg via SUBCUTANEOUS
  Filled 2020-06-17 (×2): qty 0.4

## 2020-06-17 NOTE — Care Management Important Message (Signed)
Important Message  Patient Details IM Letter given to the Patient. Name: Lisa Lester MRN: [id number] Date of Birth: 1946/04/05   Medicare Important Message Given:  Yes     [name] 06/17/2020, 2:22 PM

## 2020-06-17 NOTE — Plan of Care (Signed)
  Problem: Clinical Measurements: Goal: Diagnostic test results will improve Outcome: Progressing   

## 2020-06-17 NOTE — Progress Notes (Signed)
PROGRESS NOTE  Lisa Lester RJJ:[id number] DOB: [birth year] DOA: 06/04/2020 PCP: [name], NP   LOS: 12 days   Brief narrative:  Lisa Lester is a 74 y.o. year old female with medical history of multiple myeloma (diagnosed 03/2020) currently undergoing outpatient chemotherapy who presented to the hospital on 12/7 with 3 days worsening confusion, agitation and paranoid thinking and behavior witnessed by family and outpatient oncologist, preventing infusion therapy.  She did have a PET scan on 05/24/2020 with multiple lytic lesions are stable multiple myeloma.  Initially in the ED, CT head scan was negative. Hospital course was complicated by persistent paranoid thoughts, agitation, confusion.  Patient was placed under involuntary commitment on 12/8.  Patient complained of neck pain and was thought to be  due to C1 lesion  ("multiple hypermetabolic lytic osseous lesions consistent with multiple myeloma")from MM as evident from PET scan (05/24/2020). Patient  continues to have neck pain and oncology recommended that radiation oncology be consulted as inpatient given the severity of her pain. Mental status change was thought to be secondary to decadron from last infusion and polypharmacy from ativan and opioids.  Patient persisted to have the neck pain.  Seen by radiation oncology and underwent simulation and radiation treatment. Neurosurgery was consulted due to questionable fracture in the C1 vertebra. Patient underwent CT scan of the C-spine to assess for stability.  Assessment/Plan:  Principal Problem:   Altered mental status, unspecified Active Problems:   Multiple myeloma not having achieved remission (HCC)   Encephalopathy acute   Altered mental status   Acute psychosis ([place])   Neck pain   Acute psychosis suspect polypharmacy, resolved.    B12 TSH, ammonia, calcium, UA within normal limits.  UDS was positive for opioids.   She was initially seen by psychiatry. Was initially on  IVC which was discontinued.  Currently stable.    Head, neck, shoulder pain likely secondary to multiple myeloma.  Has  severe pain in the neck with difficulty moving.  PET scan from 05/24/2020 with noted C1 lesion .  No neurological deficits.  Radiation oncology on board. MRI of the C-spine showed possible fracture..  CT scan of the cervical spine and CTA was performed which showed multiple lytic lesions. Continue radiation treatment and analgesia.  Spoke with neurosurgery and no plans for surgical intervention.  Communicated with Dr. Irene Limbo who recommends chemotherapy initiation.  Awaiting radiation therapy input.  Multiple myeloma.  Currently on chemotherapy as outpatient by Dr. Irene Limbo.   Continue palliative radiation.  Chemotherapy as per oncology.  DVT prophylaxis: enoxaparin (LOVENOX) injection 40 mg Start: 06/04/20 2200   Code Status: Full code  Family Communication: none  Status is: Inpatient  Remains inpatient appropriate because: Inpatient level of care appropriate due to severity of illness and Further diagnostic testing,  inpatient palliative radiation, initiation of chemotherapy, PT evaluation.  Dispo: The patient is from: Home              Anticipated d/c is to: Likely home.  PT therapy pending.              Anticipated d/c date is: 2 days depending upon oncology follow-up/ radiation treatment plans.              Patient currently is not medically stable to d/c.  Consultants:  Oncology  Radiation oncology  Psychiatry  Neurosurgery  Procedures:  None  Antibiotics:  . None  Anti-infectives (From admission, onward)   None     Subjective: Today, seen  and examined at bedside.  Complains of neck pain.   Objective: Vitals:   06/16/20 2231 06/17/20 0636  BP: 128/77 (!) 115/57  Pulse: 92 96  Resp: 16 18  Temp: 98.4 F (36.9 C) 98.3 F (36.8 C)  SpO2: 94% 96%   No intake or output data in the 24 hours ending 06/17/20 0753 Filed Weights   06/04/20 1209  06/13/20 0723  Weight: 71.7 kg 71.7 kg   Body mass index is 25.5 kg/m.   Physical Exam:  General: Thinly built, not in obvious distress, alert awake and oriented, mildly anxious, on neck soft collar. HENT:   No scleral pallor or icterus noted. Oral mucosa is moist.  Tenderness over the neck on soft collar. Chest:  Clear breath sounds.  Diminished breath sounds bilaterally. No crackles or wheezes.  CVS: S1 &S2 heard. No murmur.  Regular rate and rhythm. Abdomen: Soft, nontender, nondistended.  Bowel sounds are heard.   Extremities: No cyanosis, clubbing or edema.  Peripheral pulses are palpable. Psych: Alert, awake and oriented, CNS:  No cranial nerve deficits.  Power equal in all extremities.  Moving all extremities. Skin: Warm and dry.  No rashes noted.  Data Review: I have personally reviewed the following laboratory data and studies,  CBC: Recent Labs  Lab 06/13/20 0518 06/14/20 0819 06/16/20 0537  WBC 3.3* 3.2* 2.4*  HGB 10.5* 10.8* 10.5*  HCT 33.2* 33.5* 33.2*  MCV 96.2 95.2 96.8  PLT 239 260 774   Basic Metabolic Panel: Recent Labs  Lab 06/11/20 0525 06/12/20 0625 06/13/20 0518 06/14/20 0559 06/16/20 0537  NA 136 138 140 137 137  K 3.8 4.3 4.6 4.5 4.1  CL 102 101 104 103 102  CO2 '25 27 30 25 26  ' GLUCOSE 116* 104* 106* 90 95  BUN 9 7* 6* 8 5*  CREATININE 0.73 0.82 0.78 0.72 0.77  CALCIUM 7.8* 8.2* 8.1* 7.9* 7.9*  MG 1.9 1.9 2.2 2.1 2.0   Liver Function Tests: No results for input(s): AST, ALT, ALKPHOS, BILITOT, PROT, ALBUMIN in the last 168 hours. No results for input(s): LIPASE, AMYLASE in the last 168 hours. No results for input(s): AMMONIA in the last 168 hours. Cardiac Enzymes: No results for input(s): CKTOTAL, CKMB, CKMBINDEX, TROPONINI in the last 168 hours. BNP (last 3 results) No results for input(s): BNP in the last 8760 hours.  ProBNP (last 3 results) No results for input(s): PROBNP in the last 8760 hours.  CBG: No results for input(s):  GLUCAP in the last 168 hours. No results found for this or any previous visit (from the past 240 hour(s)).   Studies: CT ANGIO NECK W OR WO CONTRAST  Result Date: 06/15/2020 CLINICAL DATA:  Chronic neck pain. Possible C1 fracture. Multiple myeloma. EXAM: CT ANGIOGRAPHY NECK TECHNIQUE: Multidetector CT imaging of the neck was performed using the standard protocol during bolus administration of intravenous contrast. Multiplanar CT image reconstructions and MIPs were obtained to evaluate the vascular anatomy. Carotid stenosis measurements (when applicable) are obtained utilizing NASCET criteria, using the distal internal carotid diameter as the denominator. CONTRAST:  154m OMNIPAQUE IOHEXOL 350 MG/ML SOLN COMPARISON:  Cervical MRI 06/13/2020 FINDINGS: Aortic arch: Mild aortic atherosclerosis. Branching pattern is normal, with the variant of the left vertebral artery arising directly from the arch. No origin stenosis ease. Right carotid system: Common carotid artery widely patent to the bifurcation. Carotid bifurcation is normal. Cervical ICA is normal. Left carotid system: Common carotid artery widely patent to the bifurcation. The bifurcation is  normal. Cervical ICA is normal. Vertebral arteries: Right vertebral artery origin widely patent. Left vertebral artery arises from the arch as noted above. Origin widely patent. Both vertebral arteries are widely patent through the cervical region. Tumor involvement on the left at C4 does involve the transverse foramen slightly but does not cause any contour change of the vertebral artery. Tumor involvement of the transverse foramen on the left at C2 is associated with occlusion of the left vertebral artery. There is some reconstituted flow within the left vertebral artery at the foramen magnum. Skeleton: Myeloma involvement throughout the cervical spine as will be described on the cervical MRI. Compression fracture T3. Other neck: No extra-spinal soft tissue mass or  lymphadenopathy. Upper chest: Lung apices are clear. IMPRESSION: 1. Myeloma involvement throughout the cervical spine as will be described on the cervical CT. 2. Tumor involvement of the transverse foramen on the left at C2 is associated with occlusion of the left vertebral artery. There is some reconstituted flow within the left vertebral artery at the foramen magnum. 3. No carotid bifurcation disease. 4. Right vertebral artery widely patent to the basilar. Reconstituted or retrograde flow within the distal left vertebral artery back as far as the foramen magnum. Aortic Atherosclerosis (ICD10-I70.0). Electronically Signed   By: Nelson Chimes M.D.   On: 06/15/2020 16:14   CT C-SPINE NO CHARGE  Result Date: 06/15/2020 CLINICAL DATA:  Multiple myeloma. Left vertebral artery occlusion at C2. C1 fracture previously shown by cervical MRI. EXAM: CT CERVICAL SPINE WITHOUT CONTRAST TECHNIQUE: Multidetector CT imaging of the cervical spine was performed without intravenous contrast. Multiplanar CT image reconstructions were also generated. COMPARISON:  CT angiography same day.  Cervical MRI 2 days ago. FINDINGS: Alignment: Normal Skull base and vertebrae: Lytic lesions throughout the skeleton consistent with diffuse multiple myeloma. Major findings will be described at each level below. Pathologic fracture the left lateral mass of C1. Soft tissues and spinal canal: No soft tissue neck lesion. No evidence of spinal canal compromise. Disc levels: Skull base and foramen magnum: Small lytic lesion at the tip of the clivus. Foramen magnum sufficiently patent. C1-2: Lytic destruction with pathologic fracture of the left lateral mass of C1 and the left side of the anterior arch. Extensive lytic change within the C2 vertebra which could place the patient at risk of pathologic fracture. Tumor involves the transverse foramen on the left where there is occlusion of the left vertebral artery shown by CT angiography. C3: Lytic  lesions, most pronounced in the left anterior vertebral body. No canal compromise. C4: Lytic disease most pronounced on the left, with tumor extending into the transverse foramen and surrounding the left vertebral artery. No vertebral artery constriction or occlusion at this level. C5: Small lytic lesions. C6: Small lytic lesions. C7: Small lytic lesions. T1: Large lytic lesion of the anterior vertebral body. No fracture. No encroachment upon the neural structures. T2: Small lytic lesions. T3: Chronic pathologic compression fracture. No encroachment upon the canal or foramina. Upper chest: Lung apices are clear. Other: None IMPRESSION: 1. Lytic lesions throughout the skeleton consistent with diffuse multiple myeloma. 2. Pathologic fractures of the left lateral mass of C1 and the left side of the anterior arch of C1. Extensive lytic change within the C2 vertebra which could place the patient at risk of pathologic fracture of C2. Tumor involves the left transverse foramen at C2 in the left vertebral artery is occluded in this location. Tumor also surrounds the left vertebral artery in the transverse foramen  C4 but there is no narrowing or near occlusion at this level. 3. Multiple lytic lesions throughout the skeleton as outlined in detail above. 4. Chronic pathologic compression fracture at T3. No encroachment upon the canal or foramina. Electronically Signed   By: Nelson Chimes M.D.   On: 06/15/2020 16:22      Flora Lipps, MD  [facility] 06/17/2020  If 7PM-7AM, please contact night-coverage

## 2020-06-17 NOTE — Progress Notes (Addendum)
HEMATOLOGY-ONCOLOGY PROGRESS NOTE  SUBJECTIVE: The patient just returned from radiation.  He reports feeling a little tired today.  Pain fairly well controlled at this time.  Still having difficulty with range of motion.  She denies chest pain, shortness of breath, abdominal pain, nausea, vomiting.  Oncology History  Multiple myeloma not having achieved remission ([place])  05/13/2020 Initial Diagnosis   Multiple myeloma not having achieved remission ([place])   05/28/2020 -  Chemotherapy   The patient had dexamethasone (DECADRON) tablet 20 mg, 20 mg, Oral,  Once, 1 of 6 cycles Administration: 20 mg (05/28/2020), 20 mg (05/31/2020) dexamethasone (DECADRON) 4 MG tablet, 1 of 1 cycle, Start date: 05/13/2020, End date: 06/04/2020 lenalidomide (REVLIMID) 15 MG capsule, 15 mg, Oral, Daily, 1 of 1 cycle, Start date: 05/15/2020, End date: 06/04/2020 bortezomib SQ (VELCADE) chemo injection (2.11m/mL concentration) 2.5 mg, 1.3 mg/m2 = 2.5 mg, Subcutaneous,  Once, 1 of 6 cycles Administration: 2.5 mg (05/28/2020), 2.5 mg (05/31/2020)  for chemotherapy treatment.       REVIEW OF SYSTEMS:   Constitutional: Denies fevers, chills  Eyes: Denies blurriness of vision Ears, nose, mouth, throat, and face: Denies mucositis or sore throat Respiratory: Denies cough, dyspnea or wheezes Cardiovascular: Denies palpitation, chest discomfort Gastrointestinal:  Denies nausea, heartburn or change in bowel habits Skin: Denies abnormal skin rashes Lymphatics: Denies new lymphadenopathy or easy bruising Neurological:Denies numbness, tingling or new weaknesses MSK: Pain improved, still with decreased range of motion of neck Behavioral/Psych: Mood is stable, no new changes  Extremities: No lower extremity edema All other systems were reviewed with the patient and are negative.  I have reviewed the past medical history, past surgical history, social history and family history with the patient and they are unchanged from  previous note.   PHYSICAL EXAMINATION: ECOG PERFORMANCE STATUS: 2 - Symptomatic, <50% confined to bed  Vitals:   06/17/20 0636 06/17/20 1247  BP: (!) 115/57 113/68  Pulse: 96 92  Resp: 18 20  Temp: 98.3 F (36.8 C) 97.9 F (36.6 C)  SpO2: 96% 98%   Filed Weights   06/04/20 1209 06/13/20 0723  Weight: 71.7 kg 71.7 kg    Intake/Output from previous day: No intake/output data recorded.  GENERAL: Chronically ill-appearing female, no distress SKIN: skin color, texture, turgor are normal, no rashes or significant lesions EYES: normal, Conjunctiva are pink and non-injected, sclera clear LUNGS: clear to auscultation and percussion with normal breathing effort HEART: regular rate & rhythm and no murmurs and no lower extremity edema ABDOMEN:abdomen soft, non-tender and normal bowel sounds Musculoskeletal: Tenderness to palpation to posterior head/neck NEURO: alert & oriented x 3 with fluent speech, no focal motor/sensory deficits  LABORATORY DATA:  I have reviewed the data as listed CMP Latest Ref Rng & Units 06/16/2020 06/14/2020 06/13/2020  Glucose 70 - 99 mg/dL 95 90 106(H)  BUN 8 - 23 mg/dL 5(L) 8 6(L)  Creatinine 0.44 - 1.00 mg/dL 0.77 0.72 0.78  Sodium 135 - 145 mmol/L 137 137 140  Potassium 3.5 - 5.1 mmol/L 4.1 4.5 4.6  Chloride 98 - 111 mmol/L 102 103 104  CO2 22 - 32 mmol/L '26 25 30  ' Calcium 8.9 - 10.3 mg/dL 7.9(L) 7.9(L) 8.1(L)  Total Protein 6.5 - 8.1 g/dL - - -  Total Bilirubin 0.3 - 1.2 mg/dL - - -  Alkaline Phos 38 - 126 U/L - - -  AST 15 - 41 U/L - - -  ALT 0 - 44 U/L - - -    Lab  Results  Component Value Date   WBC 2.4 (L) 06/16/2020   HGB 10.5 (L) 06/16/2020   HCT 33.2 (L) 06/16/2020   MCV 96.8 06/16/2020   PLT 253 06/16/2020   NEUTROABS 4.1 06/04/2020    CT HEAD WO CONTRAST  Result Date: 06/04/2020 CLINICAL DATA:  Delirium, increased confusion for 2 days. Hallucinations. EXAM: CT HEAD WITHOUT CONTRAST TECHNIQUE: Contiguous axial images were  obtained from the base of the skull through the vertex without intravenous contrast. COMPARISON:  None. FINDINGS: Brain: No evidence of large-territorial acute infarction. No parenchymal hemorrhage. No mass lesion. No extra-axial collection. No mass effect or midline shift. No hydrocephalus. Basilar cisterns are patent. Vascular: No hyperdense vessel. Skull: No acute fracture or focal lesion. Sinuses/Orbits: Paranasal sinuses and mastoid air cells are clear. The orbits are unremarkable. Other: None. IMPRESSION: No acute intracranial abnormality. Electronically Signed   By: Iven Finn M.D.   On: 06/04/2020 15:57   CT ANGIO NECK W OR WO CONTRAST  Result Date: 06/15/2020 CLINICAL DATA:  Chronic neck pain. Possible C1 fracture. Multiple myeloma. EXAM: CT ANGIOGRAPHY NECK TECHNIQUE: Multidetector CT imaging of the neck was performed using the standard protocol during bolus administration of intravenous contrast. Multiplanar CT image reconstructions and MIPs were obtained to evaluate the vascular anatomy. Carotid stenosis measurements (when applicable) are obtained utilizing NASCET criteria, using the distal internal carotid diameter as the denominator. CONTRAST:  150m OMNIPAQUE IOHEXOL 350 MG/ML SOLN COMPARISON:  Cervical MRI 06/13/2020 FINDINGS: Aortic arch: Mild aortic atherosclerosis. Branching pattern is normal, with the variant of the left vertebral artery arising directly from the arch. No origin stenosis ease. Right carotid system: Common carotid artery widely patent to the bifurcation. Carotid bifurcation is normal. Cervical ICA is normal. Left carotid system: Common carotid artery widely patent to the bifurcation. The bifurcation is normal. Cervical ICA is normal. Vertebral arteries: Right vertebral artery origin widely patent. Left vertebral artery arises from the arch as noted above. Origin widely patent. Both vertebral arteries are widely patent through the cervical region. Tumor involvement on the  left at C4 does involve the transverse foramen slightly but does not cause any contour change of the vertebral artery. Tumor involvement of the transverse foramen on the left at C2 is associated with occlusion of the left vertebral artery. There is some reconstituted flow within the left vertebral artery at the foramen magnum. Skeleton: Myeloma involvement throughout the cervical spine as will be described on the cervical MRI. Compression fracture T3. Other neck: No extra-spinal soft tissue mass or lymphadenopathy. Upper chest: Lung apices are clear. IMPRESSION: 1. Myeloma involvement throughout the cervical spine as will be described on the cervical CT. 2. Tumor involvement of the transverse foramen on the left at C2 is associated with occlusion of the left vertebral artery. There is some reconstituted flow within the left vertebral artery at the foramen magnum. 3. No carotid bifurcation disease. 4. Right vertebral artery widely patent to the basilar. Reconstituted or retrograde flow within the distal left vertebral artery back as far as the foramen magnum. Aortic Atherosclerosis (ICD10-I70.0). Electronically Signed   By: MNelson ChimesM.D.   On: 06/15/2020 16:14   MR BRAIN WO CONTRAST  Result Date: 06/13/2020 CLINICAL DATA:  Mental status change, unknown cause acute psychosis, agitation, has multiple myeloma, unable to lie down flat due to persistent neck pain despite Pain management EXAM: MRI HEAD WITHOUT CONTRAST TECHNIQUE: Multiplanar, multiecho pulse sequences of the brain and surrounding structures were obtained without intravenous contrast. COMPARISON:  06/04/2020  head CT.  Concurrent MRI cervical spine. FINDINGS: Brain: No acute infarct. No intracranial hemorrhage. No midline shift, ventriculomegaly or extra-axial fluid collection. No mass lesion. Mild cerebral atrophy with ex vacuo dilatation. Minimal chronic microvascular ischemic changes. Vascular: Left V4 segment T2 hyperintense signal may reflect  slow flow versus chronic occlusion. Remaining major intracranial flow voids proximally preserved. Left cerebellar developmental venous anomaly. Skull: Scattered subcentimeter T2 hyperintense calvarial lesions (for example 6:18) likely reflect myomatous sequela. Please see concurrent MRI cervical spine for findings below the foramen magnum. Sinuses/Orbits: Normal orbits. Mild ethmoid and left sphenoid sinus mucosal thickening. Trace bilateral mastoid free fluid. Other: None. IMPRESSION: No acute intracranial process. Minimal chronic microvascular ischemic changes. Multifocal subcentimeter calvarial myomatous lesions. Left V4 segment slow flow versus chronic occlusion/high-grade narrowing. Electronically Signed   By: Primitivo Gauze M.D.   On: 06/13/2020 10:48   MR CERVICAL SPINE WO CONTRAST  Addendum Date: 06/13/2020   ADDENDUM REPORT: 06/13/2020 11:15 ADDENDUM: These results were called by telephone at the time of interpretation on 06/13/2020 at 11:12 am to provider Pokhrel, Corrie Mckusick, MD , who verbally acknowledged these results. Electronically Signed   By: Primitivo Gauze M.D.   On: 06/13/2020 11:15   Result Date: 06/13/2020 CLINICAL DATA:  acute psychosis, agitation, has multiple myeloma, unable to lie down flat due to persistent neck pain despite Pain management. 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 No intravenous contrast was administered. COMPARISON:  Concurrent MRI head. FINDINGS: Alignment: Normal. Vertebrae: Multifocal T1 hypointense/STIR hyperintense osseous lesions. Chronic T3 compression deformity with moderate height loss. Bone marrow edema involving the left lateral C1 mass with associated compression deformity likely reflects subacute pathologic fracture. Bone marrow edema extends to the left transverse foramen with redemonstration of T2 hyperintense left  V4/V3 segment signal. Mild reactive perivertebral edema at this level. Remaining vertebral body heights are preserved. Cord: Normal signal and morphology. Posterior Fossa, vertebral arteries: Please see concurrent MRI head for findings above the foramen magnum. Disc levels: Multilevel desiccation and disc space loss. C2-3: Disc osteophyte complex abutting the ventral cord and bilateral facet degenerative spurring. Prominent ligamentum flavum. Mild spinal canal and right neural foraminal narrowing. Patent left neural foramen. C3-4: Disc osteophyte complex abutting the ventral cord with uncovertebral and facet degenerative spurring. Prominent ligamentum flavum. Mild spinal canal and bilateral neural foraminal narrowing. C4-5: Disc osteophyte complex with uncovertebral and facet hypertrophy. Mild spinal canal and moderate right and severe left neural foraminal narrowing. C5-6: Disc osteophyte complex with uncovertebral and facet hypertrophy. Superimposed right paracentral protrusion abutting the ventral cord. Mild spinal canal and moderate bilateral neural foraminal narrowing. C6-7: Disc osteophyte complex with uncovertebral and facet hypertrophy. Superimposed right foraminal protrusion. Mild spinal canal, moderate right and mild left neural foraminal narrowing. C7-T1: No significant disc bulge. Uncovertebral and facet degenerative spurring. Mild left neural foraminal narrowing. Patent spinal canal and right neural foramen. Paraspinal tissues: Negative. IMPRESSION: Multifocal myomatous lesions with subacute pathologic fracture deformity of the left lateral C1 mass likely involving the left transverse foramen. Left V3/V4 segment T2 hyperintense signal is concerning for high-grade narrowing/occlusion. Further evaluation with CTA neck is recommended to exclude vascular injury. Chronic T3 compression deformity with moderate height loss. Multilevel spondylosis as detailed above. Electronically Signed: By: Primitivo Gauze M.D. On: 06/13/2020 11:08   NM PET Image Initial (PI) Whole Body  Result Date: 05/24/2020 CLINICAL DATA:  Initial treatment strategy for multiple myeloma.  EXAM: NUCLEAR MEDICINE PET WHOLE BODY TECHNIQUE: 7.8 mCi F-18 FDG was injected intravenously. Full-ring PET imaging was performed from the head to foot after the radiotracer. CT data was obtained and used for attenuation correction and anatomic localization. Fasting blood glucose: 110 mg/dl COMPARISON:  None. FINDINGS: Mediastinal blood pool activity: SUV max 2.9 HEAD/ NECK: No hypermetabolic cervical lymph nodes are identified.There are no lesions of the pharyngeal mucosal space. Incidental CT findings: none CHEST: There are no hypermetabolic mediastinal, hilar or axillary lymph nodes. No hypermetabolic pulmonary activity or suspicious pulmonary nodularity. Incidental CT findings: Mild dependent atelectasis or scarring in both lungs. Mild atherosclerosis of the aorta, great vessels and coronary arteries. Bilateral breast implants are noted. ABDOMEN/PELVIS: There is no hypermetabolic activity within the liver, adrenal glands, spleen or pancreas. There is no hypermetabolic nodal activity. Incidental CT findings: Central 1.9 cm cyst in the right hepatic lobe. Mild aortic and branch vessel atherosclerosis. SKELETON: There are multiple hypermetabolic lytic osseous lesions within the axial skeleton. Within the left lateral mass of C1, there is a 2.2 cm lytic lesion involving the foramen transversarium which is significantly hypermetabolic (SUV max 27.7). There is a lytic lesion in the left aspect of the C4 vertebral body with an SUV max of 6.4. Multiple small lesions are present within the thoracolumbar spine. There are multiple hypermetabolic rib and sternal lesions, including a lower sternal lesion with an SUV max of 6.7 which is associated with an anteriorly displaced fracture, and a lesion involving the left 5th rib laterally with an SUV max of 9.4.  Relatively limited involvement of the bony pelvis. Incidental CT findings: Status post L3 corpectomy and L2-4 posterior fusion. Probable T3 pathologic fracture. EXTREMITIES: There are multiple small hypermetabolic lesions within the scapula and clavicle bilaterally. In the distal right humeral shaft, there is a hypermetabolic lesion within SUV max of 3.9. There is a possible lesion in the anterior intertrochanteric region of the left femur versus adjacent soft tissue activity. No other significant osseous lesions in the lower extremities. There is a small hypermetabolic soft tissue mass anteriorly in the subcutaneous fat of the mid right thigh. This measures 1.3 x 1.0 cm on image 239/4 and has an SUV max of 3.4. Incidental CT findings: none IMPRESSION: 1. There are multiple hypermetabolic lytic osseous lesions consistent with multiple myeloma. The most prominent lesion is within the left lateral mass of C1 and this likely involves the foramen transversarium. Probable T3 and sternal pathologic fractures. 2. No evidence of solid visceral organ involvement. 3. Small hypermetabolic subcutaneous nodule anteriorly in the mid right thigh may reflect an incidental soft tissue neoplasm. Electronically Signed   By: Richardean Sale M.D.   On: 05/24/2020 09:22   CT C-SPINE NO CHARGE  Result Date: 06/15/2020 CLINICAL DATA:  Multiple myeloma. Left vertebral artery occlusion at C2. C1 fracture previously shown by cervical MRI. EXAM: CT CERVICAL SPINE WITHOUT CONTRAST TECHNIQUE: Multidetector CT imaging of the cervical spine was performed without intravenous contrast. Multiplanar CT image reconstructions were also generated. COMPARISON:  CT angiography same day.  Cervical MRI 2 days ago. FINDINGS: Alignment: Normal Skull base and vertebrae: Lytic lesions throughout the skeleton consistent with diffuse multiple myeloma. Major findings will be described at each level below. Pathologic fracture the left lateral mass of C1. Soft  tissues and spinal canal: No soft tissue neck lesion. No evidence of spinal canal compromise. Disc levels: Skull base and foramen magnum: Small lytic lesion at the tip of the clivus. Foramen magnum sufficiently patent.  C1-2: Lytic destruction with pathologic fracture of the left lateral mass of C1 and the left side of the anterior arch. Extensive lytic change within the C2 vertebra which could place the patient at risk of pathologic fracture. Tumor involves the transverse foramen on the left where there is occlusion of the left vertebral artery shown by CT angiography. C3: Lytic lesions, most pronounced in the left anterior vertebral body. No canal compromise. C4: Lytic disease most pronounced on the left, with tumor extending into the transverse foramen and surrounding the left vertebral artery. No vertebral artery constriction or occlusion at this level. C5: Small lytic lesions. C6: Small lytic lesions. C7: Small lytic lesions. T1: Large lytic lesion of the anterior vertebral body. No fracture. No encroachment upon the neural structures. T2: Small lytic lesions. T3: Chronic pathologic compression fracture. No encroachment upon the canal or foramina. Upper chest: Lung apices are clear. Other: None IMPRESSION: 1. Lytic lesions throughout the skeleton consistent with diffuse multiple myeloma. 2. Pathologic fractures of the left lateral mass of C1 and the left side of the anterior arch of C1. Extensive lytic change within the C2 vertebra which could place the patient at risk of pathologic fracture of C2. Tumor involves the left transverse foramen at C2 in the left vertebral artery is occluded in this location. Tumor also surrounds the left vertebral artery in the transverse foramen C4 but there is no narrowing or near occlusion at this level. 3. Multiple lytic lesions throughout the skeleton as outlined in detail above. 4. Chronic pathologic compression fracture at T3. No encroachment upon the canal or foramina.  Electronically Signed   By: Nelson Chimes M.D.   On: 06/15/2020 16:22   CT Biopsy  Result Date: 05/27/2020 INDICATION: Multiple myeloma please perform CT-guided bone marrow biopsy for tissue diagnostic purposes. EXAM: CT-GUIDED BONE MARROW BIOPSY AND ASPIRATION MEDICATIONS: None ANESTHESIA/SEDATION: Fentanyl 100 mcg IV; Versed 2 mg IV Sedation Time: 10 Minutes; The patient was continuously monitored during the procedure by the interventional radiology nurse under my direct supervision. COMPLICATIONS: None immediate. PROCEDURE: Informed consent was obtained from the patient following an explanation of the procedure, risks, benefits and alternatives. The patient understands, agrees and consents for the procedure. All questions were addressed. A time out was performed prior to the initiation of the procedure. The patient was positioned prone and non-contrast localization CT was performed of the pelvis to demonstrate the iliac marrow spaces. The operative site was prepped and draped in the usual sterile fashion. Under sterile conditions and local anesthesia, a 22 gauge spinal needle was utilized for procedural planning. Next, an 11 gauge coaxial bone biopsy needle was advanced into the left iliac marrow space. Needle position was confirmed with CT imaging. Initially, a bone marrow aspiration was performed. Next, a bone marrow biopsy was obtained with the 11 gauge outer bone marrow device. Samples were prepared with the cytotechnologist and deemed adequate. The needle was removed and superficial hemostasis was obtained with manual compression. A dressing was applied. The patient tolerated the procedure well without immediate post procedural complication. IMPRESSION: Successful CT guided left iliac bone marrow aspiration and core biopsy. Electronically Signed   By: Sandi Mariscal M.D.   On: 05/27/2020 11:31   CT BONE MARROW BIOPSY & ASPIRATION  Result Date: 05/27/2020 INDICATION: Multiple myeloma please perform  CT-guided bone marrow biopsy for tissue diagnostic purposes. EXAM: CT-GUIDED BONE MARROW BIOPSY AND ASPIRATION MEDICATIONS: None ANESTHESIA/SEDATION: Fentanyl 100 mcg IV; Versed 2 mg IV Sedation Time: 10 Minutes; The patient  was continuously monitored during the procedure by the interventional radiology nurse under my direct supervision. COMPLICATIONS: None immediate. PROCEDURE: Informed consent was obtained from the patient following an explanation of the procedure, risks, benefits and alternatives. The patient understands, agrees and consents for the procedure. All questions were addressed. A time out was performed prior to the initiation of the procedure. The patient was positioned prone and non-contrast localization CT was performed of the pelvis to demonstrate the iliac marrow spaces. The operative site was prepped and draped in the usual sterile fashion. Under sterile conditions and local anesthesia, a 22 gauge spinal needle was utilized for procedural planning. Next, an 11 gauge coaxial bone biopsy needle was advanced into the left iliac marrow space. Needle position was confirmed with CT imaging. Initially, a bone marrow aspiration was performed. Next, a bone marrow biopsy was obtained with the 11 gauge outer bone marrow device. Samples were prepared with the cytotechnologist and deemed adequate. The needle was removed and superficial hemostasis was obtained with manual compression. A dressing was applied. The patient tolerated the procedure well without immediate post procedural complication. IMPRESSION: Successful CT guided left iliac bone marrow aspiration and core biopsy. Electronically Signed   By: Sandi Mariscal M.D.   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 -Discussed  05/28/2020 MMP shows M Protein at 0.6 g/dL, K/L light chain ratio at 97.41 -Discussed 05/24/2020 PET/CT ([id number]) which revealed "1. There are multiple hypermetabolic lytic osseous lesions consistent with multiple myeloma. The most prominent lesion is within the left lateral mass of C1 and this likely involves the foramen transversarium. Probable T3 and sternal pathologic fractures." -MRI brain has been ordered but not yet complete secondary to significant pain.  She is scheduled to go later this week to [facility] to have MRI performed under anesthesia. -Bone marrow biopsy from 05/27/2020 showed 5% plasma cells, normal cytogenetics -Advised pt again that myeloma is treatable, but is not curable. -Radiation oncology following.  She is receiving palliative radiation to C1.  Neurosurgery not planning any surgical intervention. -Recommend soft collar neck brace for neck pain. -Patient is currently on treatment with VRD.  This was started on 05/28/2020.  Appears to have missed day 8 and day 11 of her first cycle of treatment.  Recommend resuming chemotherapy 12/21.  This will be day 1 of cycle #2.  We will plan to dose reduce dexamethasone as this may have caused her altered mental status.  I have ordered her home dose of Revlimid 15 mg daily 14 days on 7 days off.  I have asked the patient to have her nephew bring her home supply into the hospital to be sent to pharmacy. -Continue current pain medications. -I have ordered for her baby aspirin and acyclovir 400 mg twice a day to be restarted on 12/21 with the cycle or 2 of her chemotherapy.  She will need to continue on this as an outpatient.  2) altered mental status -Etiology unclear - ?medications -Has been seen by psychiatry who recommends psych admission once medically stable -Overall cognition seems to be clearing up at this time. -Psych is reevaluated and no plans for psych admission.   LOS: 12 days   Mikey Bussing, DNP, AGPCNP-BC,  AOCNP 06/17/20   ADDENDUM  .Patient was Personally and  independently interviewed, examined and relevant elements of the history of present illness were reviewed in details and an assessment and plan was created. All elements of the patient's history of present illness , assessment and plan were discussed in details with Mikey Bussing, DNP, AGPCNP-BC, AOCNP. The above documentation reflects our combined findings assessment and plan.  Sullivan Lone MD MS

## 2020-06-17 NOTE — Progress Notes (Signed)
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  Pathologic fracture of C1 should be adequately treated with immobilization in a cervical collar.  Recommend proceeding with radiation therapy for palliative management.  No indications for operative intervention from our standpoint.

## 2020-06-18 ENCOUNTER — Ambulatory Visit
Admit: 2020-06-18 | Discharge: 2020-06-18 | Disposition: A | Discharge status: Home / Self Care | Payer: Medicare Other | Attending: Radiation Oncology | Admitting: Radiation Oncology

## 2020-06-18 LAB — CBC WITH DIFFERENTIAL/PLATELET
Abs Immature Granulocytes: 0.02 10*3/uL (ref 0.00–0.07)
Basophils Absolute: 0 10*3/uL (ref 0.0–0.1)
Basophils Relative: 2 %
Eosinophils Absolute: 0.1 10*3/uL (ref 0.0–0.5)
Eosinophils Relative: 6 %
HCT: 34.9 % — ABNORMAL LOW (ref 36.0–46.0)
Hemoglobin: 10.9 g/dL — ABNORMAL LOW (ref 12.0–15.0)
Immature Granulocytes: 1 %
Lymphocytes Relative: 45 %
Lymphs Abs: 0.8 10*3/uL (ref 0.7–4.0)
MCH: 30.7 pg (ref 26.0–34.0)
MCHC: 31.2 g/dL (ref 30.0–36.0)
MCV: 98.3 fL (ref 80.0–100.0)
Monocytes Absolute: 0.3 10*3/uL (ref 0.1–1.0)
Monocytes Relative: 15 %
Neutro Abs: 0.6 10*3/uL — ABNORMAL LOW (ref 1.7–7.7)
Neutrophils Relative %: 31 %
Platelets: 314 10*3/uL (ref 150–400)
RBC: 3.55 MIL/uL — ABNORMAL LOW (ref 3.87–5.11)
RDW: 13.4 % (ref 11.5–15.5)
WBC: 1.8 10*3/uL — ABNORMAL LOW (ref 4.0–10.5)
nRBC: 0 % (ref 0.0–0.2)

## 2020-06-18 LAB — COMPREHENSIVE METABOLIC PANEL
ALT: 26 U/L (ref 0–44)
AST: 18 U/L (ref 15–41)
Albumin: 3.1 g/dL — ABNORMAL LOW (ref 3.5–5.0)
Alkaline Phosphatase: 83 U/L (ref 38–126)
Anion gap: 8 (ref 5–15)
BUN: 7 mg/dL — ABNORMAL LOW (ref 8–23)
CO2: 26 mmol/L (ref 22–32)
Calcium: 8 mg/dL — ABNORMAL LOW (ref 8.9–10.3)
Chloride: 103 mmol/L (ref 98–111)
Creatinine, Ser: 0.74 mg/dL (ref 0.44–1.00)
GFR, Estimated: >60 mL/min (ref >60)
Glucose, Bld: 97 mg/dL (ref 70–99)
Potassium: 4 mmol/L (ref 3.5–5.1)
Sodium: 137 mmol/L (ref 135–145)
Total Bilirubin: 0.3 mg/dL (ref 0.3–1.2)
Total Protein: 5.6 g/dL — ABNORMAL LOW (ref 6.5–8.1)

## 2020-06-18 LAB — MAGNESIUM: Magnesium: 2.4 mg/dL (ref 1.7–2.4)

## 2020-06-18 MED ORDER — PROCHLORPERAZINE MALEATE 10 MG PO TABS
10.0000 mg | ORAL_TABLET | Freq: Once | ORAL | Status: AC
  Administered 2020-06-18: 12:00:00 10 mg via ORAL
  Filled 2020-06-18: qty 1

## 2020-06-18 MED ORDER — MAGNESIUM CITRATE PO SOLN
1.0000 | Freq: Once | ORAL | Status: AC
  Administered 2020-06-18: 10:00:00 1 via ORAL
  Filled 2020-06-18: qty 296

## 2020-06-18 MED ORDER — BORTEZOMIB CHEMO SQ INJECTION 3.5 MG (2.5MG/ML)
1.3000 mg/m2 | Freq: Once | INTRAMUSCULAR | Status: AC
  Administered 2020-06-18: 12:00:00 2.5 mg via SUBCUTANEOUS
  Filled 2020-06-18: qty 1

## 2020-06-18 MED ORDER — OXYCODONE-ACETAMINOPHEN 5-325 MG PO TABS
1.0000 | ORAL_TABLET | Freq: Four times a day (QID) | ORAL | Status: DC | PRN
  Administered 2020-06-18 (×2): 2 via ORAL
  Filled 2020-06-18 (×2): qty 2

## 2020-06-18 MED ORDER — LIDOCAINE 5 % EX PTCH: 1.0000 | MEDICATED_PATCH | Freq: Every day | CUTANEOUS | 0 refills | Status: DC

## 2020-06-18 MED ORDER — ALPRAZOLAM 0.5 MG PO TABS: 0.5000 mg | ORAL_TABLET | Freq: Three times a day (TID) | ORAL | 0 refills | Status: DC | PRN

## 2020-06-18 MED ORDER — SENNOSIDES-DOCUSATE SODIUM 8.6-50 MG PO TABS: 2.0000 | ORAL_TABLET | Freq: Two times a day (BID) | ORAL | 2 refills | Status: AC

## 2020-06-18 MED ORDER — LIDOCAINE 5 % EX PTCH
1.0000 | MEDICATED_PATCH | Freq: Every day | CUTANEOUS | Status: DC
  Administered 2020-06-18: 13:00:00 1 via TRANSDERMAL
  Filled 2020-06-18: qty 1

## 2020-06-18 MED ORDER — CYCLOBENZAPRINE HCL 5 MG PO TABS: 5.0000 mg | ORAL_TABLET | Freq: Three times a day (TID) | ORAL | 0 refills | Status: DC

## 2020-06-18 MED ORDER — PANTOPRAZOLE SODIUM 40 MG PO TBEC: 40.0000 mg | DELAYED_RELEASE_TABLET | Freq: Every day | ORAL | 1 refills | Status: AC

## 2020-06-18 MED ORDER — OXYCODONE-ACETAMINOPHEN 5-325 MG PO TABS: 1.0000 | ORAL_TABLET | Freq: Four times a day (QID) | ORAL | 0 refills | Status: DC | PRN

## 2020-06-18 NOTE — Discharge Summary (Signed)
Physician Discharge Summary  Lisa Lester ILN:[id number] DOB: 09/25/1945 DOA: 06/04/2020  PCP: [name], NP  Admit date: 06/04/2020 Discharge date: 06/18/2020  Admitted From: Home  Discharge disposition:    Recommendations for Outpatient Follow-Up:   . Follow up with your primary care provider in one week.  . Check CBC, BMP, magnesium in the next visit. . patient will need to continue radiation treatment and chemotherapy as outpatient.  Discharge Diagnosis:   Principal Problem:   Altered mental status, unspecified Active Problems:   Multiple myeloma not having achieved remission (HCC)   Encephalopathy acute   Altered mental status   Acute psychosis ([place])   Neck pain   Discharge Condition: Improved.  Diet recommendation: Low sodium, heart healthy.   Wound care: None.  Code status: Full.   History of Present Illness:   Lisa Lester a 74 y.o.year old femalewith medical history of multiple myeloma (diagnosed 03/2020) currently undergoing outpatient chemotherapy who presented to the hospital on 12/7 with 3 days worsening confusion, agitation and paranoid thinking and behavior witnessed by family and outpatient oncologist, preventing infusion therapy.  She did have a PET scan on 05/24/2020 with multiple lytic lesions are stable multiple myeloma.  Initially in the ED, CT head scan was negative.  Hospital course was complicated by persistent paranoid thoughts, agitation, confusion. Patient was placed under involuntary commitment on 12/8.  Patient complained of neck pain and was thought to be  due to C1 lesion ("multiple hypermetabolic lytic osseous lesions consistent with multiple myeloma")from MM as evident from PET scan (05/24/2020). Patient  continues to have neck pain and oncology recommended that radiation oncology be consulted as inpatient given the severity of her pain. Mental status change was thought to be secondary to decadron from last infusion and  polypharmacy from ativan and opioids.  Patient persisted to have the neck pain.  Seen by radiation oncology and underwent simulation and radiation treatment. Neurosurgery was consulted due to questionable fracture in the C1 vertebra. Patient underwent CT scan of the C-spine to assess for stability.   Hospital Course:   Following conditions were addressed during hospitalization as listed below,  Acute psychosissuspect polypharmacy,resolved.   Thought to be predominantly secondary to steroids.  Vitamin B12, TSH, ammonia, calcium, UA within normal limits.  UDS was positive for opioids.  She was initially seen by psychiatry. Was initially on IVC which was discontinued.  Currently stable.    At baseline.  Would benefit from reduced steroid use in the nature.  Head, neck, shoulder pain likely secondary to multiple myeloma.  Has been having severe pain in the neck with difficulty moving.  PET scan from 05/24/2020 with noted C1 lesion .  No neurological deficits.  Radiation oncology was consulted for palliative radiation.  MRI of the C-spine showed possible fracture..  CT scan of the cervical spine and CTA was performed which showed multiple lytic lesion with concern for fracture of C1.  Seen by neurosurgery and no plans for surgical intervention and recommend palliative radiation.  Need to continue chemotherapy and radiation therapy as outpatient  Multiple myeloma.  Currently on chemotherapy as outpatient by Dr. Irene Limbo.   Continue palliative radiation and Chemotherapy as per oncology.  Disposition.  At this time, patient is stable for disposition home.  Will follow up with radiation oncology, medical oncology and primary care physician as outpatient.  Medical Consultants:    Oncology  Radiation oncology  Psychiatry  Neurosurgery  Procedures:    XRT chemotherapy Subjective:  Today, patient was seen and examined at bedside.  Complains of mild neck pain.  No fever chills shortness of  breath cough  Discharge Exam:   Vitals:   06/17/20 1247 06/18/20 1334  BP: 113/68 (!) 114/57  Pulse: 92 62  Resp: 20 18  Temp: 97.9 F (36.6 C) 97.7 F (36.5 C)  SpO2: 98% 98%   Vitals:   06/16/20 2231 06/17/20 0636 06/17/20 1247 06/18/20 1334  BP: 128/77 (!) 115/57 113/68 (!) 114/57  Pulse: 92 96 92 62  Resp: '16 18 20 18  ' Temp: 98.4 F (36.9 C) 98.3 F (36.8 C) 97.9 F (36.6 C) 97.7 F (36.5 C)  TempSrc: Oral Oral Oral Oral  SpO2: 94% 96% 98% 98%  Weight:      Height:        General: Alert awake, not in obvious distress, thinly built, HENT: pupils equally reacting to light,  No scleral pallor or icterus noted. Oral mucosa is moist.  Tenderness over the neck Chest:  Clear breath sounds.  Diminished breath sounds bilaterally. No crackles or wheezes.  CVS: S1 &S2 heard. No murmur.  Regular rate and rhythm. Abdomen: Soft, nontender, nondistended.  Bowel sounds are heard.   Extremities: No cyanosis, clubbing or edema.  Peripheral pulses are palpable. Psych: Alert, awake and oriented, CNS:  No cranial nerve deficits.  Moving all extremities Skin: Warm and dry.  No rashes noted.  The results of significant diagnostics from this hospitalization (including imaging, microbiology, ancillary and laboratory) are listed below for reference.     Diagnostic Studies:   No results found.   Labs:   Basic Metabolic Panel: Recent Labs  Lab 06/12/20 0625 06/13/20 0518 06/14/20 0559 06/16/20 0537 06/18/20 0512  NA 138 140 137 137 137  K 4.3 4.6 4.5 4.1 4.0  CL 101 104 103 102 103  CO2 '27 30 25 26 26  ' GLUCOSE 104* 106* 90 95 97  BUN 7* 6* 8 5* 7*  CREATININE 0.82 0.78 0.72 0.77 0.74  CALCIUM 8.2* 8.1* 7.9* 7.9* 8.0*  MG 1.9 2.2 2.1 2.0 2.4   GFR Estimated Creatinine Clearance: 64.5 mL/min (by C-G formula based on SCr of 0.74 mg/dL). Liver Function Tests: Recent Labs  Lab 06/18/20 0512  AST 18  ALT 26  ALKPHOS 83  BILITOT 0.3  PROT 5.6*  ALBUMIN 3.1*   No  results for input(s): LIPASE, AMYLASE in the last 168 hours. No results for input(s): AMMONIA in the last 168 hours. Coagulation profile No results for input(s): INR, PROTIME in the last 168 hours.  CBC: Recent Labs  Lab 06/13/20 0518 06/14/20 0819 06/16/20 0537 06/18/20 0512  WBC 3.3* 3.2* 2.4* 1.8*  NEUTROABS  --   --   --  0.6*  HGB 10.5* 10.8* 10.5* 10.9*  HCT 33.2* 33.5* 33.2* 34.9*  MCV 96.2 95.2 96.8 98.3  PLT 239 260 253 314   Cardiac Enzymes: No results for input(s): CKTOTAL, CKMB, CKMBINDEX, TROPONINI in the last 168 hours. BNP: Invalid input(s): POCBNP CBG: No results for input(s): GLUCAP in the last 168 hours. D-Dimer No results for input(s): DDIMER in the last 72 hours. Hgb A1c No results for input(s): HGBA1C in the last 72 hours. Lipid Profile No results for input(s): CHOL, HDL, LDLCALC, TRIG, CHOLHDL, LDLDIRECT in the last 72 hours. Thyroid function studies No results for input(s): TSH, T4TOTAL, T3FREE, THYROIDAB in the last 72 hours.  Invalid input(s): FREET3 Anemia work up No results for input(s): VITAMINB12, FOLATE, FERRITIN, TIBC, IRON, RETICCTPCT  in the last 72 hours. Microbiology No results found for this or any previous visit (from the past 240 hour(s)).   Discharge Instructions:   Discharge Instructions    Call MD for:  persistant nausea and vomiting   Complete by: As directed    Call MD for:  severe uncontrolled pain   Complete by: As directed    Call MD for:  temperature >100.4   Complete by: As directed    Diet - low sodium heart healthy   Complete by: As directed    Discharge instructions   Complete by: As directed    Follow-up with your primary care provider in 1 week.  Continue radiation treatment and chemotherapy as outpatient.  Take medications as prescribed.  Use neck collar when possible.  Warm compress to the neck two to three times a day.   Increase activity slowly   Complete by: As directed    TREATMENT CONDITIONS   Complete  by: As directed    Patient should have CBC & CMP within 7 days prior to chemotherapy administration. NOTIFY MD IF: ANC < 1500, Hemoglobin < 8, PLT < 100,000,  Total Bili > 1.5, Creatinine > 1.5, ALT & AST > 80 or if patient has unstable vital signs: Temperature > 38.5, SBP > 180 or < 90, RR > 30 or HR > 100.     Allergies as of 06/18/2020   No Known Allergies     Medication List    STOP taking these medications   dexamethasone 4 MG tablet Commonly known as: DECADRON   gabapentin 100 MG capsule Commonly known as: NEURONTIN   HYDROcodone-acetaminophen 10-325 MG tablet Commonly known as: Norco     TAKE these medications   acyclovir 400 MG tablet Commonly known as: ZOVIRAX Take 400 mg by mouth 2 (two) times daily.   ALPRAZolam 0.5 MG tablet Commonly known as: XANAX Take 1 tablet (0.5 mg total) by mouth 3 (three) times daily as needed for anxiety.   aspirin EC 81 MG tablet Take 81 mg by mouth daily. Swallow whole.   cyclobenzaprine 5 MG tablet Commonly known as: FLEXERIL Take 1 tablet (5 mg total) by mouth 3 (three) times daily.   DULoxetine 30 MG capsule Commonly known as: CYMBALTA Take 1 capsule (30 mg total) by mouth daily.   fentaNYL 25 MCG/HR Commonly known as: [place] 1 patch onto the skin every 3 (three) days.   lenalidomide 15 MG capsule Commonly known as: Revlimid TAKE 1 CAPSULE BY MOUTH  DAILY FOR 14 DAYS, THEN 7  DAYS OFF REPEAT EVERY 21  DAYS What changed:   how much to take  how to take this  when to take this  additional instructions   lidocaine 5 % Commonly known as: LIDODERM Place 1 patch onto the skin daily. Remove & Discard patch within 12 hours or as directed by MD on the neck   meloxicam 15 MG tablet Commonly known as: MOBIC Take 15 mg by mouth daily.   ondansetron 8 MG tablet Commonly known as: ZOFRAN Take 8 mg by mouth every 8 (eight) hours as needed for nausea or vomiting.   oxyCODONE-acetaminophen 5-325 MG  tablet Commonly known as: PERCOCET/ROXICET Take 1 tablet by mouth every 6 (six) hours as needed for up to 39 doses for moderate pain or severe pain.   pantoprazole 40 MG tablet Commonly known as: Protonix Take 1 tablet (40 mg total) by mouth daily.   polyethylene glycol 17 g packet Commonly known as:  MiraLax Take 17 g by mouth daily. What changed:   when to take this  reasons to take this   prochlorperazine 10 MG tablet Commonly known as: COMPAZINE Take 10 mg by mouth every 6 (six) hours as needed for nausea or vomiting.   senna-docusate 8.6-50 MG tablet Commonly known as: Senna S Take 2 tablets by mouth 2 (two) times daily. What changed: when to take this         Time coordinating discharge: 39 minutes  Signed:  Chiyo Fay  [facility] 06/18/2020, 1:41 PM

## 2020-06-18 NOTE — Progress Notes (Signed)
Per Mikey Bussing, NP - she has spoken w/ Dr. Irene Limbo and given [facility] to treat w/ today's [place].  Kennith Center, Pharm.D., CPP 06/18/2020@10 :17 AM

## 2020-06-18 NOTE — Plan of Care (Signed)
  Problem: Clinical Measurements: Goal: Will remain free from infection Outcome: Progressing Goal: Diagnostic test results will improve Outcome: Progressing Goal: Cardiovascular complication will be avoided Outcome: Progressing   

## 2020-06-18 NOTE — Evaluation (Signed)
Physical Therapy Evaluation Patient Details Name: Lisa Lester MRN: [id number] DOB: 05/24/1946 Today's Date: 06/18/2020   History of Present Illness  74 y.o. year old female with medical history of multiple myeloma (diagnosed 03/2020) currently undergoing outpatient chemotherapy who presented to the hospital on 12/7 with 3 days worsening confusion, agitation and paranoid thinking and behavior witnessed by family and outpatient oncologist, preventing infusion therapy.  She did have a PET scan on 05/24/2020 with multiple lytic lesions are stable multiple myeloma. 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  Pathologic fracture of C1 should be adequately treated with immobilization in a cervical collar.  Recommend proceeding with radiation therapy for palliative management.  No indications for operative intervention from our standpoint."  Clinical Impression  Patient evaluated by Physical Therapy with no further acute PT needs identified. All education has been completed and the patient has no further questions.  Pt very agreeable to ambulate and mobilizing well.  Pt plans to d/c to her nephew's home. See below for any follow-up Physical Therapy or equipment needs. PT is signing off. Thank you for this referral.     Follow Up Recommendations No PT follow up    Equipment Recommendations  None recommended by PT    Recommendations for Other Services       Precautions / Restrictions Precautions Precautions: None      Mobility  Bed Mobility Overal bed mobility: Independent                  Transfers Overall transfer level: Independent                  Ambulation/Gait Ambulation/Gait assistance: Modified independent (Device/Increase time)   Assistive device: None Gait Pattern/deviations: WFL(Within Functional Limits)     General Gait Details: except for stiff upper body due to  compensating for minimizing neck movements  Stairs            Wheelchair Mobility    Modified Rankin (Stroke Patients Only)       Balance Overall balance assessment: No apparent balance deficits (not formally assessed)                                           Pertinent Vitals/Pain Pain Assessment: No/denies pain    Home Living Family/patient expects to be discharged to:: Private residence             Home Layout: One level   Additional Comments: Pt reports living with her nephew currently (since she is from [facility])    Prior Function Level of Independence: Independent               Hand Dominance        Extremity/Trunk Assessment   Upper Extremity Assessment Upper Extremity Assessment: Overall WFL for tasks assessed    Lower Extremity Assessment Lower Extremity Assessment: Overall WFL for tasks assessed    Cervical / Trunk Assessment Cervical / Trunk Assessment: Other exceptions Cervical / Trunk Exceptions: soft collar on pt's bed, pt reports the MD this morning told her she didn't have to wear it all the time so she currently has it off, pt educated with minimal neck movements and careful of fast sudden neck movement and she states she is aware.  pt mostly kept neck in stiff position during session.  Communication   Communication: No difficulties  Cognition Arousal/Alertness: Awake/alert Behavior During Therapy: WFL for tasks assessed/performed Overall Cognitive Status: Within Functional Limits for tasks assessed                                        General Comments      Exercises     Assessment/Plan    PT Assessment Patent does not need any further PT services  PT Problem List         PT Treatment Interventions      PT Goals (Current goals can be found in the Care Plan section)  Acute Rehab PT Goals PT Goal Formulation: All assessment and education complete, DC therapy    Frequency      Barriers to discharge        Co-evaluation               AM-PAC PT "6 Clicks" Mobility  Outcome Measure Help needed turning from your back to your side while in a flat bed without using bedrails?: None Help needed moving from lying on your back to sitting on the side of a flat bed without using bedrails?: None Help needed moving to and from a bed to a chair (including a wheelchair)?: None Help needed standing up from a chair using your arms (e.g., wheelchair or bedside chair)?: None Help needed to walk in hospital room?: None Help needed climbing 3-5 steps with a railing? : None 6 Click Score: 24    End of Session   Activity Tolerance: Patient tolerated treatment well Patient left: in bed;with call bell/phone within reach Nurse Communication: Mobility status PT Visit Diagnosis: Difficulty in walking, not elsewhere classified (R26.2)    Time: 3614-4315 PT Time Calculation (min) (ACUTE ONLY): 11 min   Charges:   PT Evaluation $PT Eval Low Complexity: 1 Low        Kati PT, DPT Acute Rehabilitation Services Pager: [phone] Office: [phone] Trena Platt 06/18/2020, 2:57 PM

## 2020-06-18 NOTE — Progress Notes (Addendum)
HEMATOLOGY-ONCOLOGY PROGRESS NOTE  SUBJECTIVE: The patient just returned from radiation.  She is sitting on the side of bed.  Pain continues to be well controlled overall.  Denies chest pain, shortness of breath, abdominal pain, nausea, vomiting.  No fevers or chills.  She states that she is hoping to be discharged today.  Oncology History  Multiple myeloma not having achieved remission ([place])  05/13/2020 Initial Diagnosis   Multiple myeloma not having achieved remission ([place])   05/28/2020 -  Chemotherapy   The patient had dexamethasone (DECADRON) tablet 20 mg, 20 mg, Oral,  Once, 1 of 1 cycle Administration: 20 mg (05/28/2020), 20 mg (05/31/2020) dexamethasone (DECADRON) 4 MG tablet, 1 of 1 cycle, Start date: 05/13/2020, End date: 06/04/2020 lenalidomide (REVLIMID) 15 MG capsule, 15 mg, Oral, Daily, 1 of 1 cycle, Start date: 05/15/2020, End date: 06/04/2020 bortezomib SQ (VELCADE) chemo injection (2.63m/mL concentration) 2.5 mg, 1.3 mg/m2 = 2.5 mg, Subcutaneous,  Once, 1 of 6 cycles Administration: 2.5 mg (05/28/2020), 2.5 mg (05/31/2020)  for chemotherapy treatment.       REVIEW OF SYSTEMS:   Constitutional: Denies fevers, chills  Eyes: Denies blurriness of vision Ears, nose, mouth, throat, and face: Denies mucositis or sore throat Respiratory: Denies cough, dyspnea or wheezes Cardiovascular: Denies palpitation, chest discomfort Gastrointestinal:  Denies nausea, heartburn or change in bowel habits Skin: Denies abnormal skin rashes Lymphatics: Denies new lymphadenopathy or easy bruising Neurological:Denies numbness, tingling or new weaknesses MSK: Pain improved, still with decreased range of motion of neck Behavioral/Psych: Mood is stable, no new changes  Extremities: No lower extremity edema All other systems were reviewed with the patient and are negative.  I have reviewed the past medical history, past surgical history, social history and family history with the patient and they  are unchanged from previous note.   PHYSICAL EXAMINATION: ECOG PERFORMANCE STATUS: 2 - Symptomatic, <50% confined to bed  Vitals:   06/17/20 0636 06/17/20 1247  BP: (!) 115/57 113/68  Pulse: 96 92  Resp: 18 20  Temp: 98.3 F (36.8 C) 97.9 F (36.6 C)  SpO2: 96% 98%   Filed Weights   06/04/20 1209 06/13/20 0723  Weight: 71.7 kg 71.7 kg    Intake/Output from previous day: 12/20 0701 - 12/21 0700 In: 758[P.O.:730] Out: -   GENERAL: Chronically ill-appearing female, no distress SKIN: skin color, texture, turgor are normal, no rashes or significant lesions EYES: normal, Conjunctiva are pink and non-injected, sclera clear LUNGS: clear to auscultation and percussion with normal breathing effort HEART: regular rate & rhythm and no murmurs and no lower extremity edema ABDOMEN:abdomen soft, non-tender and normal bowel sounds Musculoskeletal: Tenderness to palpation to posterior head/neck NEURO: alert & oriented x 3 with fluent speech, no focal motor/sensory deficits  LABORATORY DATA:  I have reviewed the data as listed CMP Latest Ref Rng & Units 06/18/2020 06/16/2020 06/14/2020  Glucose 70 - 99 mg/dL 97 95 90  BUN 8 - 23 mg/dL 7(L) 5(L) 8  Creatinine 0.44 - 1.00 mg/dL 0.74 0.77 0.72  Sodium 135 - 145 mmol/L 137 137 137  Potassium 3.5 - 5.1 mmol/L 4.0 4.1 4.5  Chloride 98 - 111 mmol/L 103 102 103  CO2 22 - 32 mmol/L _0 Calcium 8.9 - 10.3 mg/dL 8.0(L) 7.9(L) 7.9(L)  Total Protein 6.5 - 8.1 g/dL 5.6(L) - -  Total Bilirubin 0.3 - 1.2 mg/dL 0.3 - -  Alkaline Phos 38 - 126 U/L 83 - -  AST 15 - 41 U/L 18 - -  ALT 0 - 44 U/L 26 - -    Lab Results  Component Value Date   WBC 1.8 (L) 06/18/2020   HGB 10.9 (L) 06/18/2020   HCT 34.9 (L) 06/18/2020   MCV 98.3 06/18/2020   PLT 314 06/18/2020   NEUTROABS 0.6 (L) 06/18/2020    CT HEAD WO CONTRAST  Result Date: 06/04/2020 CLINICAL DATA:  Delirium, increased confusion for 2 days. Hallucinations. EXAM: CT HEAD WITHOUT  CONTRAST TECHNIQUE: Contiguous axial images were obtained from the base of the skull through the vertex without intravenous contrast. COMPARISON:  None. FINDINGS: Brain: No evidence of large-territorial acute infarction. No parenchymal hemorrhage. No mass lesion. No extra-axial collection. No mass effect or midline shift. No hydrocephalus. Basilar cisterns are patent. Vascular: No hyperdense vessel. Skull: No acute fracture or focal lesion. Sinuses/Orbits: Paranasal sinuses and mastoid air cells are clear. The orbits are unremarkable. Other: None. IMPRESSION: No acute intracranial abnormality. Electronically Signed   By: Tish Frederickson M.D.   On: 06/04/2020 15:57   CT ANGIO NECK W OR WO CONTRAST  Result Date: 06/15/2020 CLINICAL DATA:  Chronic neck pain. Possible C1 fracture. Multiple myeloma. EXAM: CT ANGIOGRAPHY NECK TECHNIQUE: Multidetector CT imaging of the neck was performed using the standard protocol during bolus administration of intravenous contrast. Multiplanar CT image reconstructions and MIPs were obtained to evaluate the vascular anatomy. Carotid stenosis measurements (when applicable) are obtained utilizing NASCET criteria, using the distal internal carotid diameter as the denominator. CONTRAST:  OMNIPAQUE IOHEXOL 350 MG/ML SOLN COMPARISON:  Cervical MRI 06/13/2020 FINDINGS: Aortic arch: Mild aortic atherosclerosis. Branching pattern is normal, with the variant of the left vertebral artery arising directly from the arch. No origin stenosis ease. Right carotid system: Common carotid artery widely patent to the bifurcation. Carotid bifurcation is normal. Cervical ICA is normal. Left carotid system: Common carotid artery widely patent to the bifurcation. The bifurcation is normal. Cervical ICA is normal. Vertebral arteries: Right vertebral artery origin widely patent. Left vertebral artery arises from the arch as noted above. Origin widely patent. Both vertebral arteries are widely patent  through the cervical region. Tumor involvement on the left at C4 does involve the transverse foramen slightly but does not cause any contour change of the vertebral artery. Tumor involvement of the transverse foramen on the left at C2 is associated with occlusion of the left vertebral artery. There is some reconstituted flow within the left vertebral artery at the foramen magnum. Skeleton: Myeloma involvement throughout the cervical spine as will be described on the cervical MRI. Compression fracture T3. Other neck: No extra-spinal soft tissue mass or lymphadenopathy. Upper chest: Lung apices are clear. IMPRESSION: 1. Myeloma involvement throughout the cervical spine as will be described on the cervical CT. 2. Tumor involvement of the transverse foramen on the left at C2 is associated with occlusion of the left vertebral artery. There is some reconstituted flow within the left vertebral artery at the foramen magnum. 3. No carotid bifurcation disease. 4. Right vertebral artery widely patent to the basilar. Reconstituted or retrograde flow within the distal left vertebral artery back as far as the foramen magnum. Aortic Atherosclerosis (ICD10-I70.0). Electronically Signed   By: Paulina Fusi M.D.   On: 06/15/2020 16:14   MR BRAIN WO CONTRAST  Result Date: 06/13/2020 CLINICAL DATA:  Mental status change, unknown cause acute psychosis, agitation, has multiple myeloma, unable to lie down flat due to persistent neck pain despite Pain management EXAM: MRI HEAD WITHOUT CONTRAST TECHNIQUE: Multiplanar, multiecho pulse sequences of  the brain and surrounding structures were obtained without intravenous contrast. COMPARISON:  06/04/2020 head CT.  Concurrent MRI cervical spine. FINDINGS: Brain: No acute infarct. No intracranial hemorrhage. No midline shift, ventriculomegaly or extra-axial fluid collection. No mass lesion. Mild cerebral atrophy with ex vacuo dilatation. Minimal chronic microvascular ischemic changes.  Vascular: Left V4 segment T2 hyperintense signal may reflect slow flow versus chronic occlusion. Remaining major intracranial flow voids proximally preserved. Left cerebellar developmental venous anomaly. Skull: Scattered subcentimeter T2 hyperintense calvarial lesions (for example 6:18) likely reflect myomatous sequela. Please see concurrent MRI cervical spine for findings below the foramen magnum. Sinuses/Orbits: Normal orbits. Mild ethmoid and left sphenoid sinus mucosal thickening. Trace bilateral mastoid free fluid. Other: None. IMPRESSION: No acute intracranial process. Minimal chronic microvascular ischemic changes. Multifocal subcentimeter calvarial myomatous lesions. Left V4 segment slow flow versus chronic occlusion/high-grade narrowing. Electronically Signed   By: Primitivo Gauze M.D.   On: 06/13/2020 10:48   MR CERVICAL SPINE WO CONTRAST  Addendum Date: 06/13/2020   ADDENDUM REPORT: 06/13/2020 11:15 ADDENDUM: These results were called by telephone at the time of interpretation on 06/13/2020 at 11:12 am to provider Pokhrel, Corrie Mckusick, MD , who verbally acknowledged these results. Electronically Signed   By: Primitivo Gauze M.D.   On: 06/13/2020 11:15   Result Date: 06/13/2020 CLINICAL DATA:  acute psychosis, agitation, has multiple myeloma, unable to lie down flat due to persistent neck pain despite Pain management. 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 No intravenous contrast was administered. COMPARISON:  Concurrent MRI head. FINDINGS: Alignment: Normal. Vertebrae: Multifocal T1 hypointense/STIR hyperintense osseous lesions. Chronic T3 compression deformity with moderate height loss. Bone marrow edema involving the left lateral C1 mass with associated compression deformity likely reflects subacute pathologic fracture. Bone marrow edema extends to the left  transverse foramen with redemonstration of T2 hyperintense left V4/V3 segment signal. Mild reactive perivertebral edema at this level. Remaining vertebral body heights are preserved. Cord: Normal signal and morphology. Posterior Fossa, vertebral arteries: Please see concurrent MRI head for findings above the foramen magnum. Disc levels: Multilevel desiccation and disc space loss. C2-3: Disc osteophyte complex abutting the ventral cord and bilateral facet degenerative spurring. Prominent ligamentum flavum. Mild spinal canal and right neural foraminal narrowing. Patent left neural foramen. C3-4: Disc osteophyte complex abutting the ventral cord with uncovertebral and facet degenerative spurring. Prominent ligamentum flavum. Mild spinal canal and bilateral neural foraminal narrowing. C4-5: Disc osteophyte complex with uncovertebral and facet hypertrophy. Mild spinal canal and moderate right and severe left neural foraminal narrowing. C5-6: Disc osteophyte complex with uncovertebral and facet hypertrophy. Superimposed right paracentral protrusion abutting the ventral cord. Mild spinal canal and moderate bilateral neural foraminal narrowing. C6-7: Disc osteophyte complex with uncovertebral and facet hypertrophy. Superimposed right foraminal protrusion. Mild spinal canal, moderate right and mild left neural foraminal narrowing. C7-T1: No significant disc bulge. Uncovertebral and facet degenerative spurring. Mild left neural foraminal narrowing. Patent spinal canal and right neural foramen. Paraspinal tissues: Negative. IMPRESSION: Multifocal myomatous lesions with subacute pathologic fracture deformity of the left lateral C1 mass likely involving the left transverse foramen. Left V3/V4 segment T2 hyperintense signal is concerning for high-grade narrowing/occlusion. Further evaluation with CTA neck is recommended to exclude vascular injury. Chronic T3 compression deformity with moderate height loss. Multilevel spondylosis  as detailed above. Electronically Signed: By: Primitivo Gauze M.D. On: 06/13/2020 11:08   NM PET Image Initial (PI) Whole Body  Result Date: 05/24/2020 CLINICAL DATA:  Initial treatment strategy for multiple myeloma. EXAM: NUCLEAR MEDICINE PET WHOLE BODY TECHNIQUE: 7.8 mCi F-18 FDG was injected intravenously. Full-ring PET imaging was performed from the head to foot after the radiotracer. CT data was obtained and used for attenuation correction and anatomic localization. Fasting blood glucose: 110 mg/dl COMPARISON:  None. FINDINGS: Mediastinal blood pool activity: SUV max 2.9 HEAD/ NECK: No hypermetabolic cervical lymph nodes are identified.There are no lesions of the pharyngeal mucosal space. Incidental CT findings: none CHEST: There are no hypermetabolic mediastinal, hilar or axillary lymph nodes. No hypermetabolic pulmonary activity or suspicious pulmonary nodularity. Incidental CT findings: Mild dependent atelectasis or scarring in both lungs. Mild atherosclerosis of the aorta, great vessels and coronary arteries. Bilateral breast implants are noted. ABDOMEN/PELVIS: There is no hypermetabolic activity within the liver, adrenal glands, spleen or pancreas. There is no hypermetabolic nodal activity. Incidental CT findings: Central 1.9 cm cyst in the right hepatic lobe. Mild aortic and branch vessel atherosclerosis. SKELETON: There are multiple hypermetabolic lytic osseous lesions within the axial skeleton. Within the left lateral mass of C1, there is a 2.2 cm lytic lesion involving the foramen transversarium which is significantly hypermetabolic (SUV max 62.5). There is a lytic lesion in the left aspect of the C4 vertebral body with an SUV max of 6.4. Multiple small lesions are present within the thoracolumbar spine. There are multiple hypermetabolic rib and sternal lesions, including a lower sternal lesion with an SUV max of 6.7 which is associated with an anteriorly displaced fracture, and a lesion  involving the left 5th rib laterally with an SUV max of 9.4. Relatively limited involvement of the bony pelvis. Incidental CT findings: Status post L3 corpectomy and L2-4 posterior fusion. Probable T3 pathologic fracture. EXTREMITIES: There are multiple small hypermetabolic lesions within the scapula and clavicle bilaterally. In the distal right humeral shaft, there is a hypermetabolic lesion within SUV max of 3.9. There is a possible lesion in the anterior intertrochanteric region of the left femur versus adjacent soft tissue activity. No other significant osseous lesions in the lower extremities. There is a small hypermetabolic soft tissue mass anteriorly in the subcutaneous fat of the mid right thigh. This measures 1.3 x 1.0 cm on image 239/4 and has an SUV max of 3.4. Incidental CT findings: none IMPRESSION: 1. There are multiple hypermetabolic lytic osseous lesions consistent with multiple myeloma. The most prominent lesion is within the left lateral mass of C1 and this likely involves the foramen transversarium. Probable T3 and sternal pathologic fractures. 2. No evidence of solid visceral organ involvement. 3. Small hypermetabolic subcutaneous nodule anteriorly in the mid right thigh may reflect an incidental soft tissue neoplasm. Electronically Signed   By: Richardean Sale M.D.   On: 05/24/2020 09:22   CT C-SPINE NO CHARGE  Result Date: 06/15/2020 CLINICAL DATA:  Multiple myeloma. Left vertebral artery occlusion at C2. C1 fracture previously shown by cervical MRI. EXAM: CT CERVICAL SPINE WITHOUT CONTRAST TECHNIQUE: Multidetector CT imaging of the cervical spine was performed without intravenous contrast. Multiplanar CT image reconstructions were also generated. COMPARISON:  CT angiography same day.  Cervical MRI 2 days ago. FINDINGS: Alignment: Normal Skull base and vertebrae: Lytic lesions throughout the skeleton consistent with diffuse multiple myeloma. Major findings will be described at each level  below. Pathologic fracture the left lateral mass of C1. Soft tissues and spinal canal: No soft tissue neck lesion. No evidence of spinal canal compromise. Disc levels: Skull base and foramen magnum: Small  lytic lesion at the tip of the clivus. Foramen magnum sufficiently patent. C1-2: Lytic destruction with pathologic fracture of the left lateral mass of C1 and the left side of the anterior arch. Extensive lytic change within the C2 vertebra which could place the patient at risk of pathologic fracture. Tumor involves the transverse foramen on the left where there is occlusion of the left vertebral artery shown by CT angiography. C3: Lytic lesions, most pronounced in the left anterior vertebral body. No canal compromise. C4: Lytic disease most pronounced on the left, with tumor extending into the transverse foramen and surrounding the left vertebral artery. No vertebral artery constriction or occlusion at this level. C5: Small lytic lesions. C6: Small lytic lesions. C7: Small lytic lesions. T1: Large lytic lesion of the anterior vertebral body. No fracture. No encroachment upon the neural structures. T2: Small lytic lesions. T3: Chronic pathologic compression fracture. No encroachment upon the canal or foramina. Upper chest: Lung apices are clear. Other: None IMPRESSION: 1. Lytic lesions throughout the skeleton consistent with diffuse multiple myeloma. 2. Pathologic fractures of the left lateral mass of C1 and the left side of the anterior arch of C1. Extensive lytic change within the C2 vertebra which could place the patient at risk of pathologic fracture of C2. Tumor involves the left transverse foramen at C2 in the left vertebral artery is occluded in this location. Tumor also surrounds the left vertebral artery in the transverse foramen C4 but there is no narrowing or near occlusion at this level. 3. Multiple lytic lesions throughout the skeleton as outlined in detail above. 4. Chronic pathologic compression  fracture at T3. No encroachment upon the canal or foramina. Electronically Signed   By: Nelson Chimes M.D.   On: 06/15/2020 16:22   CT Biopsy  Result Date: 05/27/2020 INDICATION: Multiple myeloma please perform CT-guided bone marrow biopsy for tissue diagnostic purposes. EXAM: CT-GUIDED BONE MARROW BIOPSY AND ASPIRATION MEDICATIONS: None ANESTHESIA/SEDATION: Fentanyl 100 mcg IV; Versed 2 mg IV Sedation Time: 10 Minutes; The patient was continuously monitored during the procedure by the interventional radiology nurse under my direct supervision. COMPLICATIONS: None immediate. PROCEDURE: Informed consent was obtained from the patient following an explanation of the procedure, risks, benefits and alternatives. The patient understands, agrees and consents for the procedure. All questions were addressed. A time out was performed prior to the initiation of the procedure. The patient was positioned prone and non-contrast localization CT was performed of the pelvis to demonstrate the iliac marrow spaces. The operative site was prepped and draped in the usual sterile fashion. Under sterile conditions and local anesthesia, a 22 gauge spinal needle was utilized for procedural planning. Next, an 11 gauge coaxial bone biopsy needle was advanced into the left iliac marrow space. Needle position was confirmed with CT imaging. Initially, a bone marrow aspiration was performed. Next, a bone marrow biopsy was obtained with the 11 gauge outer bone marrow device. Samples were prepared with the cytotechnologist and deemed adequate. The needle was removed and superficial hemostasis was obtained with manual compression. A dressing was applied. The patient tolerated the procedure well without immediate post procedural complication. IMPRESSION: Successful CT guided left iliac bone marrow aspiration and core biopsy. Electronically Signed   By: Sandi Mariscal M.D.   On: 05/27/2020 11:31   CT BONE MARROW BIOPSY & ASPIRATION  Result Date:  05/27/2020 INDICATION: Multiple myeloma please perform CT-guided bone marrow biopsy for tissue diagnostic purposes. EXAM: CT-GUIDED BONE MARROW BIOPSY AND ASPIRATION MEDICATIONS: None ANESTHESIA/SEDATION: Fentanyl 100  mcg IV; Versed 2 mg IV Sedation Time: 10 Minutes; The patient was continuously monitored during the procedure by the interventional radiology nurse under my direct supervision. COMPLICATIONS: None immediate. PROCEDURE: Informed consent was obtained from the patient following an explanation of the procedure, risks, benefits and alternatives. The patient understands, agrees and consents for the procedure. All questions were addressed. A time out was performed prior to the initiation of the procedure. The patient was positioned prone and non-contrast localization CT was performed of the pelvis to demonstrate the iliac marrow spaces. The operative site was prepped and draped in the usual sterile fashion. Under sterile conditions and local anesthesia, a 22 gauge spinal needle was utilized for procedural planning. Next, an 11 gauge coaxial bone biopsy needle was advanced into the left iliac marrow space. Needle position was confirmed with CT imaging. Initially, a bone marrow aspiration was performed. Next, a bone marrow biopsy was obtained with the 11 gauge outer bone marrow device. Samples were prepared with the cytotechnologist and deemed adequate. The needle was removed and superficial hemostasis was obtained with manual compression. A dressing was applied. The patient tolerated the procedure well without immediate post procedural complication. IMPRESSION: Successful CT guided left iliac bone marrow aspiration and core biopsy. Electronically Signed   By: Simonne Come M.D.   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 -Discussed 05/28/2020 MMP shows M Protein at 0.6 g/dL, K/L light chain ratio at 97.41 -Discussed 05/24/2020 PET/CT ([id number]) which revealed "1. There are multiple hypermetabolic lytic osseous lesions consistent with multiple myeloma. The most prominent lesion is within the left lateral mass of C1 and this likely involves the foramen transversarium. Probable T3 and sternal pathologic fractures." -MRI brain has been ordered but not yet complete secondary to significant pain.  She is scheduled to go later this week to [facility] to have MRI performed under anesthesia. -Bone marrow biopsy from 05/27/2020 showed 5% plasma cells, normal cytogenetics -Advised pt again that myeloma is treatable, but is not curable. -Radiation oncology following.  She is receiving palliative radiation to C1. Neurosurgery not planning any surgical intervention. -Recommend soft collar neck brace for neck pain. -Patient is currently on treatment with VRD.  This was started on 05/28/2020.  Appears to have missed day 8 and day 11 of her first cycle of treatment.  Cycle 2-day 1 of chemotherapy scheduled for today.  She will receive Velcade and will start on Revlimid 15 mg daily.  Counts have been reviewed and she has neutropenia of unclear etiology.  Okay to proceed with chemotherapy despite counts.  This was reviewed with Dr. Candise Che.  The patient should continue on Revlimid as an outpatient-she takes his 14 days in a row and then 7 days off. -Continue current pain medications. -Continue baby aspirin and acyclovir 400 mg twice a day.  She will need to continue on this as an outpatient.  2) altered mental status -Etiology unclear - ?medications -Has been seen by psychiatry who recommends psych admission once medically stable -Overall cognition seems to be clearing up at this time. -Psych is reevaluated and no plans for psych admission.  From our standpoint, the  patient will be discharged to home if otherwise medically stable.  She will need to continue to  come to the cath center daily under the direction of Dr. Tammi Klippel for radiation treatments as scheduled.  Outpatient lab and chemotherapy appointments have been scheduled.  She is due to follow-up with Dr. Irene Limbo for an office visit 07/04/2020   LOS: 13 days   Mikey Bussing, DNP, AGPCNP-BC, AOCNP 06/18/20  ADDENDUM  .Patient was Personally and independently interviewed, examined and relevant elements of the history of present illness were reviewed in details and an assessment and plan was created. All elements of the patient's history of present illness , assessment and plan were discussed in details with stin Curcio, DNP, AGPCNP-BC, AOCNP. The above documentation reflects our combined findings assessment and plan.  -we had remove dexamethasone from her chemotherapy plan at this time due to concerns for AMS related to this. -restart chemotherapy VRd today. -neutropenia not chemotherapy related (patient was off chemotherapy when she developed this-- could be from other medications ? Likely related to haldol. Would recommend psych reconsider this medication. 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   Sullivan Lone MD MS

## 2020-06-19 ENCOUNTER — Ambulatory Visit
Admission: RE | Admit: 2020-06-19 | Discharge: 2020-06-19 | Disposition: A | Discharge status: Home / Self Care | Payer: Medicare Other | Source: Ambulatory Visit | Attending: Radiation Oncology | Admitting: Radiation Oncology

## 2020-06-19 ENCOUNTER — Other Ambulatory Visit: Payer: Self-pay

## 2020-06-19 DIAGNOSIS — Z510 Encounter for antineoplastic radiation therapy: Secondary | ICD-10-CM | POA: Diagnosis present

## 2020-06-19 DIAGNOSIS — C9000 Multiple myeloma not having achieved remission: Secondary | ICD-10-CM | POA: Diagnosis not present

## 2020-06-19 DIAGNOSIS — C7951 Secondary malignant neoplasm of bone: Secondary | ICD-10-CM | POA: Diagnosis not present

## 2020-06-20 ENCOUNTER — Telehealth: Payer: Self-pay | Admitting: *Deleted

## 2020-06-20 ENCOUNTER — Ambulatory Visit
Admission: RE | Admit: 2020-06-20 | Discharge: 2020-06-20 | Disposition: A | Discharge status: Home / Self Care | Payer: Medicare Other | Source: Ambulatory Visit | Attending: Radiation Oncology | Admitting: Radiation Oncology

## 2020-06-20 ENCOUNTER — Encounter: Payer: Self-pay | Admitting: Hematology

## 2020-06-20 ENCOUNTER — Other Ambulatory Visit: Payer: Self-pay | Admitting: Hematology

## 2020-06-20 ENCOUNTER — Inpatient Hospital Stay: Payer: Medicare Other

## 2020-06-20 ENCOUNTER — Other Ambulatory Visit: Payer: Self-pay

## 2020-06-20 VITALS — BP 147/78 | HR 95 | Temp 98.2°F | Resp 17 | Ht 65.0 in | Wt 153.5 lb

## 2020-06-20 DIAGNOSIS — C9000 Multiple myeloma not having achieved remission: Secondary | ICD-10-CM

## 2020-06-20 DIAGNOSIS — Z7189 Other specified counseling: Secondary | ICD-10-CM

## 2020-06-20 DIAGNOSIS — Z5111 Encounter for antineoplastic chemotherapy: Secondary | ICD-10-CM | POA: Diagnosis not present

## 2020-06-20 DIAGNOSIS — Z510 Encounter for antineoplastic radiation therapy: Secondary | ICD-10-CM | POA: Diagnosis not present

## 2020-06-20 LAB — CMP (CANCER CENTER ONLY)
ALT: 27 U/L (ref 0–44)
AST: 16 U/L (ref 15–41)
Albumin: 3.8 g/dL (ref 3.5–5.0)
Alkaline Phosphatase: 135 U/L — ABNORMAL HIGH (ref 38–126)
Anion gap: 6 (ref 5–15)
BUN: 10 mg/dL (ref 8–23)
CO2: 29 mmol/L (ref 22–32)
Calcium: 9.1 mg/dL (ref 8.9–10.3)
Chloride: 103 mmol/L (ref 98–111)
Creatinine: 0.94 mg/dL (ref 0.44–1.00)
GFR, Estimated: >60 mL/min (ref >60)
Glucose, Bld: 98 mg/dL (ref 70–99)
Potassium: 4.3 mmol/L (ref 3.5–5.1)
Sodium: 138 mmol/L (ref 135–145)
Total Bilirubin: 0.2 mg/dL — ABNORMAL LOW (ref 0.3–1.2)
Total Protein: 7.2 g/dL (ref 6.5–8.1)

## 2020-06-20 LAB — CBC WITH DIFFERENTIAL/PLATELET
Abs Immature Granulocytes: 0.02 10*3/uL (ref 0.00–0.07)
Basophils Absolute: 0.1 10*3/uL (ref 0.0–0.1)
Basophils Relative: 3 %
Eosinophils Absolute: 0.1 10*3/uL (ref 0.0–0.5)
Eosinophils Relative: 2 %
HCT: 38.8 % (ref 36.0–46.0)
Hemoglobin: 12.6 g/dL (ref 12.0–15.0)
Immature Granulocytes: 1 %
Lymphocytes Relative: 34 %
Lymphs Abs: 0.9 10*3/uL (ref 0.7–4.0)
MCH: 30.1 pg (ref 26.0–34.0)
MCHC: 32.5 g/dL (ref 30.0–36.0)
MCV: 92.8 fL (ref 80.0–100.0)
Monocytes Absolute: 0.3 10*3/uL (ref 0.1–1.0)
Monocytes Relative: 9 %
Neutro Abs: 1.4 10*3/uL — ABNORMAL LOW (ref 1.7–7.7)
Neutrophils Relative %: 51 %
Platelets: 343 10*3/uL (ref 150–400)
RBC: 4.18 MIL/uL (ref 3.87–5.11)
RDW: 13.1 % (ref 11.5–15.5)
WBC: 2.7 10*3/uL — ABNORMAL LOW (ref 4.0–10.5)
nRBC: 0 % (ref 0.0–0.2)

## 2020-06-20 MED ORDER — BORTEZOMIB CHEMO SQ INJECTION 3.5 MG (2.5MG/ML)
1.3000 mg/m2 | Freq: Once | INTRAMUSCULAR | Status: AC
  Administered 2020-06-20: 15:00:00 2.5 mg via SUBCUTANEOUS
  Filled 2020-06-20: qty 1

## 2020-06-20 MED ORDER — PROCHLORPERAZINE MALEATE 10 MG PO TABS
ORAL_TABLET | ORAL | Status: AC
  Filled 2020-06-20: qty 1

## 2020-06-20 MED ORDER — PROCHLORPERAZINE MALEATE 10 MG PO TABS
10.0000 mg | ORAL_TABLET | Freq: Once | ORAL | Status: AC
  Administered 2020-06-20: 15:00:00 10 mg via ORAL

## 2020-06-20 NOTE — Patient Instructions (Signed)
Bortezomib injection What is this medicine? BORTEZOMIB (bor TEZ oh mib) is a medicine that targets proteins in cancer cells and stops the cancer cells from growing. It is used to treat multiple myeloma and mantle-cell lymphoma. This medicine may be used for other purposes; ask your health care provider or pharmacist if you have questions. COMMON BRAND NAME(S): Velcade What should I tell my health care provider before I take this medicine? They need to know if you have any of these conditions:  diabetes  heart disease  irregular heartbeat  liver disease  on hemodialysis  low blood counts, like low white blood cells, platelets, or hemoglobin  peripheral neuropathy  taking medicine for blood pressure  an unusual or allergic reaction to bortezomib, mannitol, boron, other medicines, foods, dyes, or preservatives  pregnant or trying to get pregnant  breast-feeding How should I use this medicine? This medicine is for injection into a vein or for injection under the skin. It is given by a health care professional in a hospital or clinic setting. Talk to your pediatrician regarding the use of this medicine in children. Special care may be needed. Overdosage: If you think you have taken too much of this medicine contact a poison control center or emergency room at once. NOTE: This medicine is only for you. Do not share this medicine with others. What if I miss a dose? It is important not to miss your dose. Call your doctor or health care professional if you are unable to keep an appointment. What may interact with this medicine? This medicine may interact with the following medications:  ketoconazole  rifampin  ritonavir  St. John's Wort This list may not describe all possible interactions. Give your health care provider a list of all the medicines, herbs, non-prescription drugs, or dietary supplements you use. Also tell them if you smoke, drink alcohol, or use illegal drugs. Some  items may interact with your medicine. What should I watch for while using this medicine? You may get drowsy or dizzy. Do not drive, use machinery, or do anything that needs mental alertness until you know how this medicine affects you. Do not stand or sit up quickly, especially if you are an older patient. This reduces the risk of dizzy or fainting spells. In some cases, you may be given additional medicines to help with side effects. Follow all directions for their use. Call your doctor or health care professional for advice if you get a fever, chills or sore throat, or other symptoms of a cold or flu. Do not treat yourself. This drug decreases your body's ability to fight infections. Try to avoid being around people who are sick. This medicine may increase your risk to bruise or bleed. Call your doctor or health care professional if you notice any unusual bleeding. You may need blood work done while you are taking this medicine. In some patients, this medicine may cause a serious brain infection that may cause death. If you have any problems seeing, thinking, speaking, walking, or standing, tell your doctor right away. If you cannot reach your doctor, urgently seek other source of medical care. Check with your doctor or health care professional if you get an attack of severe diarrhea, nausea and vomiting, or if you sweat a lot. The loss of too much body fluid can make it dangerous for you to take this medicine. Do not become pregnant while taking this medicine or for at least 7 months after stopping it. Women should inform their doctor   if they wish to become pregnant or think they might be pregnant. Men should not father a child while taking this medicine and for at least 4 months after stopping it. There is a potential for serious side effects to an unborn child. Talk to your health care professional or pharmacist for more information. Do not breast-feed an infant while taking this medicine or for 2  months after stopping it. This medicine may interfere with the ability to have a child. You should talk with your doctor or health care professional if you are concerned about your fertility. What side effects may I notice from receiving this medicine? Side effects that you should report to your doctor or health care professional as soon as possible:  allergic reactions like skin rash, itching or hives, swelling of the face, lips, or tongue  breathing problems  changes in hearing  changes in vision  fast, irregular heartbeat  feeling faint or lightheaded, falls  pain, tingling, numbness in the hands or feet  right upper belly pain  seizures  swelling of the ankles, feet, hands  unusual bleeding or bruising  unusually weak or tired  vomiting  yellowing of the eyes or skin Side effects that usually do not require medical attention (report to your doctor or health care professional if they continue or are bothersome):  changes in emotions or moods  constipation  diarrhea  loss of appetite  headache  irritation at site where injected  nausea This list may not describe all possible side effects. Call your doctor for medical advice about side effects. You may report side effects to FDA at 1-800-FDA-1088. Where should I keep my medicine? This drug is given in a hospital or clinic and will not be stored at home. NOTE: This sheet is a summary. It may not cover all possible information. If you have questions about this medicine, talk to your doctor, pharmacist, or health care provider.  2020 Elsevier/Gold Standard (2017-10-25 16:29:31)  

## 2020-06-20 NOTE — Progress Notes (Signed)
Ok to treat per kale, MD with ANC of 1.4.

## 2020-06-20 NOTE — Progress Notes (Signed)
Labs ordered.

## 2020-06-20 NOTE — Progress Notes (Signed)
Labs repeated

## 2020-06-24 ENCOUNTER — Inpatient Hospital Stay: Payer: Medicare Other

## 2020-06-24 ENCOUNTER — Other Ambulatory Visit: Payer: Self-pay | Admitting: *Deleted

## 2020-06-24 ENCOUNTER — Other Ambulatory Visit: Payer: Self-pay

## 2020-06-24 ENCOUNTER — Ambulatory Visit
Admission: RE | Admit: 2020-06-24 | Discharge: 2020-06-24 | Disposition: A | Discharge status: Home / Self Care | Payer: Medicare Other | Source: Ambulatory Visit | Attending: Radiation Oncology | Admitting: Radiation Oncology

## 2020-06-24 VITALS — BP 140/55 | HR 98 | Temp 98.1°F | Resp 18

## 2020-06-24 DIAGNOSIS — Z7189 Other specified counseling: Secondary | ICD-10-CM

## 2020-06-24 DIAGNOSIS — C9000 Multiple myeloma not having achieved remission: Secondary | ICD-10-CM | POA: Diagnosis not present

## 2020-06-24 DIAGNOSIS — Z510 Encounter for antineoplastic radiation therapy: Secondary | ICD-10-CM | POA: Diagnosis not present

## 2020-06-24 LAB — CBC WITH DIFFERENTIAL/PLATELET
Abs Immature Granulocytes: 0.17 10*3/uL — ABNORMAL HIGH (ref 0.00–0.07)
Basophils Absolute: 0.1 10*3/uL (ref 0.0–0.1)
Basophils Relative: 1 %
Eosinophils Absolute: 0.2 10*3/uL (ref 0.0–0.5)
Eosinophils Relative: 4 %
HCT: 39.5 % (ref 36.0–46.0)
Hemoglobin: 12.9 g/dL (ref 12.0–15.0)
Immature Granulocytes: 4 %
Lymphocytes Relative: 25 %
Lymphs Abs: 1 10*3/uL (ref 0.7–4.0)
MCH: 30 pg (ref 26.0–34.0)
MCHC: 32.7 g/dL (ref 30.0–36.0)
MCV: 91.9 fL (ref 80.0–100.0)
Monocytes Absolute: 0.3 10*3/uL (ref 0.1–1.0)
Monocytes Relative: 7 %
Neutro Abs: 2.4 10*3/uL (ref 1.7–7.7)
Neutrophils Relative %: 59 %
Platelets: 234 10*3/uL (ref 150–400)
RBC: 4.3 MIL/uL (ref 3.87–5.11)
RDW: 13.3 % (ref 11.5–15.5)
WBC: 4.1 10*3/uL (ref 4.0–10.5)
nRBC: 0 % (ref 0.0–0.2)

## 2020-06-24 LAB — CMP (CANCER CENTER ONLY)
ALT: 15 U/L (ref 0–44)
AST: 16 U/L (ref 15–41)
Albumin: 3.7 g/dL (ref 3.5–5.0)
Alkaline Phosphatase: 117 U/L (ref 38–126)
Anion gap: 7 (ref 5–15)
BUN: 14 mg/dL (ref 8–23)
CO2: 29 mmol/L (ref 22–32)
Calcium: 8.1 mg/dL — ABNORMAL LOW (ref 8.9–10.3)
Chloride: 100 mmol/L (ref 98–111)
Creatinine: 0.96 mg/dL (ref 0.44–1.00)
GFR, Estimated: >60 mL/min (ref >60)
Glucose, Bld: 96 mg/dL (ref 70–99)
Potassium: 4.2 mmol/L (ref 3.5–5.1)
Sodium: 136 mmol/L (ref 135–145)
Total Bilirubin: 0.4 mg/dL (ref 0.3–1.2)
Total Protein: 6.7 g/dL (ref 6.5–8.1)

## 2020-06-24 MED ORDER — ZOLEDRONIC ACID 4 MG/100ML IV SOLN
4.0000 mg | Freq: Once | INTRAVENOUS | Status: AC
  Administered 2020-06-24: 16:00:00 4 mg via INTRAVENOUS

## 2020-06-24 MED ORDER — PROCHLORPERAZINE MALEATE 10 MG PO TABS
10.0000 mg | ORAL_TABLET | Freq: Once | ORAL | Status: AC
  Administered 2020-06-24: 16:00:00 10 mg via ORAL

## 2020-06-24 MED ORDER — BORTEZOMIB CHEMO SQ INJECTION 3.5 MG (2.5MG/ML)
1.3000 mg/m2 | Freq: Once | INTRAMUSCULAR | Status: AC
  Administered 2020-06-24: 16:00:00 2.5 mg via SUBCUTANEOUS
  Filled 2020-06-24: qty 1

## 2020-06-24 MED ORDER — SODIUM CHLORIDE 0.9 % IV SOLN
Freq: Once | INTRAVENOUS | Status: AC
Start: 2020-06-24 — End: 2020-06-24
  Filled 2020-06-24: qty 250

## 2020-06-24 MED ORDER — ZOLEDRONIC ACID 4 MG/100ML IV SOLN
INTRAVENOUS | Status: AC
  Filled 2020-06-24: qty 100

## 2020-06-24 MED ORDER — PROCHLORPERAZINE MALEATE 10 MG PO TABS
ORAL_TABLET | ORAL | Status: AC
  Filled 2020-06-24: qty 1

## 2020-06-24 MED ORDER — LIDOCAINE 5 % EX PTCH: 1.0000 | MEDICATED_PATCH | Freq: Every day | CUTANEOUS | 0 refills | Status: DC

## 2020-06-24 NOTE — Patient Instructions (Signed)
[facility] Discharge Instructions for Patients Receiving Chemotherapy  Today you received the following chemotherapy agents Velcade.  To help prevent nausea and vomiting after your treatment, we encourage you to take your nausea medication as directed.  If you develop nausea and vomiting that is not controlled by your nausea medication, call the clinic.   BELOW ARE SYMPTOMS THAT SHOULD BE REPORTED IMMEDIATELY:  *FEVER GREATER THAN 100.5 F  *CHILLS WITH OR WITHOUT FEVER  NAUSEA AND VOMITING THAT IS NOT CONTROLLED WITH YOUR NAUSEA MEDICATION  *UNUSUAL SHORTNESS OF BREATH  *UNUSUAL BRUISING OR BLEEDING  TENDERNESS IN MOUTH AND THROAT WITH OR WITHOUT PRESENCE OF ULCERS  *URINARY PROBLEMS  *BOWEL PROBLEMS  UNUSUAL RASH Items with * indicate a potential emergency and should be followed up as soon as possible.  Feel free to call the clinic should you have any questions or concerns. The clinic phone number is [phone].  Please show the CHEMO ALERT CARD at check-in to the Emergency Department and triage nurse.   

## 2020-06-24 NOTE — Telephone Encounter (Signed)
Sent mychart message

## 2020-06-25 ENCOUNTER — Ambulatory Visit
Admission: RE | Admit: 2020-06-25 | Discharge: 2020-06-25 | Disposition: A | Discharge status: Home / Self Care | Payer: Medicare Other | Source: Ambulatory Visit | Attending: Radiation Oncology | Admitting: Radiation Oncology

## 2020-06-25 DIAGNOSIS — Z510 Encounter for antineoplastic radiation therapy: Secondary | ICD-10-CM | POA: Diagnosis not present

## 2020-06-26 ENCOUNTER — Ambulatory Visit
Admission: RE | Admit: 2020-06-26 | Discharge: 2020-06-26 | Disposition: A | Discharge status: Home / Self Care | Payer: Medicare Other | Source: Ambulatory Visit | Attending: Radiation Oncology | Admitting: Radiation Oncology

## 2020-06-26 ENCOUNTER — Other Ambulatory Visit: Payer: Self-pay | Admitting: Hematology

## 2020-06-26 DIAGNOSIS — C9000 Multiple myeloma not having achieved remission: Secondary | ICD-10-CM

## 2020-06-26 DIAGNOSIS — Z510 Encounter for antineoplastic radiation therapy: Secondary | ICD-10-CM | POA: Diagnosis not present

## 2020-06-27 ENCOUNTER — Encounter: Payer: Self-pay | Admitting: Urology

## 2020-06-27 ENCOUNTER — Other Ambulatory Visit: Payer: Self-pay

## 2020-06-27 ENCOUNTER — Inpatient Hospital Stay: Payer: Medicare Other

## 2020-06-27 ENCOUNTER — Other Ambulatory Visit: Payer: Self-pay | Admitting: *Deleted

## 2020-06-27 ENCOUNTER — Other Ambulatory Visit: Payer: Self-pay | Admitting: Radiation Oncology

## 2020-06-27 ENCOUNTER — Ambulatory Visit
Admission: RE | Admit: 2020-06-27 | Discharge: 2020-06-27 | Disposition: A | Discharge status: Home / Self Care | Payer: Medicare Other | Source: Ambulatory Visit | Attending: Radiation Oncology | Admitting: Radiation Oncology

## 2020-06-27 ENCOUNTER — Encounter: Payer: Self-pay | Admitting: Hematology

## 2020-06-27 ENCOUNTER — Telehealth: Payer: Self-pay | Admitting: *Deleted

## 2020-06-27 VITALS — BP 123/65 | HR 90 | Temp 98.3°F | Resp 18

## 2020-06-27 DIAGNOSIS — C9000 Multiple myeloma not having achieved remission: Secondary | ICD-10-CM

## 2020-06-27 DIAGNOSIS — Z510 Encounter for antineoplastic radiation therapy: Secondary | ICD-10-CM | POA: Diagnosis not present

## 2020-06-27 DIAGNOSIS — Z7189 Other specified counseling: Secondary | ICD-10-CM

## 2020-06-27 MED ORDER — PROCHLORPERAZINE MALEATE 10 MG PO TABS
ORAL_TABLET | ORAL | Status: AC
  Filled 2020-06-27: qty 1

## 2020-06-27 MED ORDER — OXYCODONE-ACETAMINOPHEN 5-325 MG PO TABS: 1.0000 | ORAL_TABLET | Freq: Four times a day (QID) | ORAL | 0 refills | Status: DC | PRN

## 2020-06-27 MED ORDER — BORTEZOMIB CHEMO SQ INJECTION 3.5 MG (2.5MG/ML)
1.3000 mg/m2 | Freq: Once | INTRAMUSCULAR | Status: AC
  Administered 2020-06-27: 16:00:00 2.5 mg via SUBCUTANEOUS
  Filled 2020-06-27: qty 1

## 2020-06-27 MED ORDER — PROCHLORPERAZINE MALEATE 10 MG PO TABS
10.0000 mg | ORAL_TABLET | Freq: Once | ORAL | Status: AC
  Administered 2020-06-27: 15:00:00 10 mg via ORAL

## 2020-06-27 MED ORDER — LIDOCAINE 5 % EX PTCH: 1.0000 | MEDICATED_PATCH | Freq: Every day | CUTANEOUS | 5 refills | Status: DC

## 2020-06-27 NOTE — Telephone Encounter (Addendum)
Discussed patient's concerns via secure chat with Marquita Palms, RN w/Dr. Myna Hidalgo and Jonelle Sidle, RN w/Dr.Manning.  Per Dr. Broadus John nurse, no order was been given by Dr. Kathrynn Running for this patient to have fluids today. Radiation to her lumbar spine doesn't cause dehydration or dry mouth --  the patient told Dr. Kathrynn Running her mouth was dry and she wanted an infusion because her friend had one and it helped. Samantha and infusion nurse reported B/P not low today when vital signs taken.  Dr. Kathrynn Running ordered lidocaine patches today for patient to use on neck. Per nurse in infusion, patient stated she would try lidocaine patch for pain and wear the soft cervical collar as instructed by physical therapy on discharge from hospital.  Infusion nurse provided patient with oral fluids and instruction and encouragement to drink.  Patient received education on using Lidocaine patch for neck pain; wear soft cervical collar; increase po fluids.  Per Erie Noe RN, Dr. Myna Hidalgo informed.

## 2020-06-27 NOTE — Telephone Encounter (Signed)
Patient in lobby before appointment in [facility] today and asked to speak with Dr. Clyda Greener desk nurse.  Patient has appointment today in [facility] and in Infusion at 1515 for Velcade. Patient seated in wheelchair.  She reports the following:  1) She said she is dehydrated and needs extra fluid while she is here today and wanted Dr. Candise Che to order it.  2) She has pain on left side of neck - 10/10 with movement - no pain if not moving. Says Percocet is not helping at all. Heat helps. Asking about a different pain med and/or muscle relaxant being prescribed. NOTE: Patient has Flexril prescribed, received #30 on 12/21. She was not sure if she is taking it. Informed patient that message will be given to [place] MD in Dr. Clyda Greener absence.    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 Pathologic fracture of C1 should be adequately treated with immobilization in a cervical collar." Patient is not currently wearing collar.   Message routed to Dr. Myna Hidalgo /Dr. Candise Che

## 2020-06-27 NOTE — Patient Instructions (Signed)
[facility] Discharge Instructions for Patients Receiving Chemotherapy  Today you received the following chemotherapy agents Velcade.  To help prevent nausea and vomiting after your treatment, we encourage you to take your nausea medication as directed.  If you develop nausea and vomiting that is not controlled by your nausea medication, call the clinic.   BELOW ARE SYMPTOMS THAT SHOULD BE REPORTED IMMEDIATELY:  *FEVER GREATER THAN 100.5 F  *CHILLS WITH OR WITHOUT FEVER  NAUSEA AND VOMITING THAT IS NOT CONTROLLED WITH YOUR NAUSEA MEDICATION  *UNUSUAL SHORTNESS OF BREATH  *UNUSUAL BRUISING OR BLEEDING  TENDERNESS IN MOUTH AND THROAT WITH OR WITHOUT PRESENCE OF ULCERS  *URINARY PROBLEMS  *BOWEL PROBLEMS  UNUSUAL RASH Items with * indicate a potential emergency and should be followed up as soon as possible.  Feel free to call the clinic should you have any questions or concerns. The clinic phone number is [phone].  Please show the CHEMO ALERT CARD at check-in to the Emergency Department and triage nurse.   

## 2020-06-27 NOTE — Telephone Encounter (Signed)
Received faxed request from [facility] : "The authorization number from 06/11/20 for Revlimid 15 mg has expired. Please obtain a new [employer] authorization number. Fax the new prescription/authoirzation number to [phone] or call [phone]. Thank you" Prescription was sent by Dr. Candise Che on 06/26/20 with auth number from 06/11/20 Will renew auth # and renew RX as soon as window opens on [employer] sight 07/03/20. Contacted [facility] and informed them of same

## 2020-06-27 NOTE — Progress Notes (Signed)
Patient with complaints of neck pain when she moves her neck. Mady Gemma, RN, Dr. Clyda Greener nurse notified, see note. Patient instructed to wear neck collar to help with neck pain and to also use lidocaine patch as prescribed. Patient also instructed to drink "49 to 64 ounces of water daily", per Dr. Myna Hidalgo. Patient verbalized understanding and agreed to call office with any additional questions or concerns.

## 2020-07-01 ENCOUNTER — Encounter: Payer: Self-pay | Admitting: Registered Nurse

## 2020-07-01 ENCOUNTER — Other Ambulatory Visit: Payer: Self-pay | Admitting: Hematology

## 2020-07-01 ENCOUNTER — Other Ambulatory Visit: Payer: Self-pay

## 2020-07-01 ENCOUNTER — Ambulatory Visit: Payer: Medicare Other | Admitting: Registered Nurse

## 2020-07-01 VITALS — BP 133/79 | HR 116 | Temp 97.6°F | Resp 18 | Ht 65.0 in | Wt 150.4 lb

## 2020-07-01 DIAGNOSIS — F419 Anxiety disorder, unspecified: Secondary | ICD-10-CM | POA: Diagnosis not present

## 2020-07-01 DIAGNOSIS — R52 Pain, unspecified: Secondary | ICD-10-CM

## 2020-07-01 MED ORDER — ALPRAZOLAM 0.5 MG PO TABS: 0.5000 mg | ORAL_TABLET | Freq: Three times a day (TID) | ORAL | 0 refills | Status: AC | PRN

## 2020-07-01 MED ORDER — CYCLOBENZAPRINE HCL 5 MG PO TABS
5.0000 mg | ORAL_TABLET | Freq: Three times a day (TID) | ORAL | 0 refills | Status: DC
Start: 2020-07-01 — End: 2020-08-01

## 2020-07-01 NOTE — Progress Notes (Signed)
Established Patient Office Visit  Subjective:  Patient ID: Lisa Lester, female    DOB: [birth year]  Age: 75 y.o. MRN: [id number]  CC:  Chief Complaint  Patient presents with  . Hospitalization Follow-up    Patient states she is here for an hospital follow up and also medication refill.    HPI Lisa Lester presents for hfu  Was seen by ER and admitted for acute mental status change.  During stay was imaged, had many labs done, and was assessed by behavioral health Stabilized and dc'd - still no clear etiology.   Feeling much more stable since d/c - states that her nephew Lisa Lester and his wife Lisa Lester have been helping maintain medication regularity and she is feeling that things will get even better now that the stress of the holidays is past. She is interested in getting established with a therapist and with a psychiatrist - her eventual goal is to move back home to [place], but we discussed the benefits of having someone local in the mean time. We also discussed that without a clear idea of when her disease will be controlled, it is impossible to say when/if she will be in [place] again.   Otherwise, no concerns. She is feeling very well supported at home. She is extremely happy with the care she is receiving from Lisa Lester.   Past Medical History:  Diagnosis Date  . Multiple myeloma ([place])   . Pathologic fracture    L3  . Plasmacytoma [facility])     Past Surgical History:  Procedure Laterality Date  . BACK SURGERY    . BREAST ENHANCEMENT SURGERY Bilateral   . LUMBAR FUSION     L2 - L4, hx of pathologic fx of L3  . RADIOLOGY WITH ANESTHESIA N/A 06/13/2020   Procedure: MRI BRAIN WITHOUT; C-SPINE WITHOUT;  Surgeon: Radiologist, Medication, MD;  Location: [facility];  Service: Radiology;  Laterality: N/A;    Family History  Problem Relation Age of Onset  . Cancer - Other Mother   . Cancer - Other Father     Social History   Socioeconomic History  . Marital status: Widowed     Spouse name: Lisa Lester  . Number of children: 0  . Years of education: 32  . Highest education level: 12th grade  Occupational History  . Not on file  Tobacco Use  . Smoking status: Never Smoker  . Smokeless tobacco: Never Used  Vaping Use  . Vaping Use: Never used  Substance and Sexual Activity  . Alcohol use: Never  . Drug use: Never  . Sexual activity: Not Currently  Other Topics Concern  . Not on file  Social History Narrative  . Not on file   Social Determinants of Health   Financial Resource Strain: Not on file  Food Insecurity: Not on file  Transportation Needs: Not on file  Physical Activity: Not on file  Stress: Not on file  Social Connections: Not on file  Intimate Partner Violence: Not on file    Outpatient Medications Prior to Visit  Medication Sig Dispense Refill  . acyclovir (ZOVIRAX) 400 MG tablet Take 400 mg by mouth 2 (two) times daily.    Marland Kitchen aspirin EC 81 MG tablet Take 81 mg by mouth daily. Swallow whole.    . DULoxetine (CYMBALTA) 30 MG capsule Take 1 capsule (30 mg total) by mouth daily. 30 capsule 3  . fentaNYL (DURAGESIC) 25 MCG/HR Place 1 patch onto the skin every 3 (three) days.  10 patch 0  . lenalidomide (REVLIMID) 15 MG capsule TAKE 1 CAPSULE BY MOUTH  DAILY FOR 14 DAYS, THEN 7  DAYS OFF REPEAT EVERY 21  DAYS 14 capsule 0  . lidocaine (LIDODERM) 5 % Place 1 patch onto the skin daily. Remove & Discard patch within 12 hours or as directed by MD on the neck 30 patch 5  . meloxicam (MOBIC) 15 MG tablet Take 15 mg by mouth daily.    . ondansetron (ZOFRAN) 8 MG tablet Take 8 mg by mouth every 8 (eight) hours as needed for nausea or vomiting.    Marland Kitchen oxyCODONE-acetaminophen (PERCOCET/ROXICET) 5-325 MG tablet Take 1 tablet by mouth every 6 (six) hours as needed for up to 39 doses for moderate pain or severe pain. 30 tablet 0  . pantoprazole (PROTONIX) 40 MG tablet Take 1 tablet (40 mg total) by mouth daily. 30 tablet 1  . polyethylene glycol (MIRALAX) 17  g packet Take 17 g by mouth daily. (Patient taking differently: Take 17 g by mouth daily as needed for mild constipation.) 30 each 1  . prochlorperazine (COMPAZINE) 10 MG tablet Take 10 mg by mouth every 6 (six) hours as needed for nausea or vomiting.    . senna-docusate (SENNA S) 8.6-50 MG tablet Take 2 tablets by mouth 2 (two) times daily. 60 tablet 2  . ALPRAZolam (XANAX) 0.5 MG tablet Take 1 tablet (0.5 mg total) by mouth 3 (three) times daily as needed for anxiety. 15 tablet 0  . cyclobenzaprine (FLEXERIL) 5 MG tablet Take 1 tablet (5 mg total) by mouth 3 (three) times daily. 30 tablet 0   No facility-administered medications prior to visit.    No Known Allergies  ROS Review of Systems  Constitutional: Negative.   HENT: Negative.   Eyes: Negative.   Respiratory: Negative.   Cardiovascular: Negative.   Gastrointestinal: Negative.   Genitourinary: Negative.   Musculoskeletal: Negative.   Skin: Negative.   Neurological: Negative.   Psychiatric/Behavioral: Negative.   All other systems reviewed and are negative.     Objective:    Physical Exam Vitals and nursing note reviewed.  Constitutional:      General: She is not in acute distress.    Appearance: Normal appearance. She is normal weight. She is not ill-appearing, toxic-appearing or diaphoretic.  Cardiovascular:     Rate and Rhythm: Normal rate and regular rhythm.     Heart sounds: Normal heart sounds. No murmur heard. No friction rub. No gallop.   Pulmonary:     Effort: Pulmonary effort is normal. No respiratory distress.     Breath sounds: Normal breath sounds. No stridor. No wheezing, rhonchi or rales.  Chest:     Chest wall: No tenderness.  Skin:    General: Skin is warm and dry.  Neurological:     General: No focal deficit present.     Mental Status: She is alert and oriented to person, place, and time. Mental status is at baseline.  Psychiatric:        Mood and Affect: Mood normal.        Behavior:  Behavior normal.        Thought Content: Thought content normal.        Judgment: Judgment normal.     BP 133/79   Pulse (!) 116   Temp 97.6 F (36.4 C) (Temporal)   Resp 18   Ht '5\' 5"'  (1.651 m)   Wt 150 lb 6.4 oz (68.2 kg)   SpO2 98%  BMI 25.03 kg/m  Wt Readings from Last 3 Encounters:  07/01/20 150 lb 6.4 oz (68.2 kg)  06/20/20 153 lb 8 oz (69.6 kg)  06/13/20 158 lb (71.7 kg)     Health Maintenance Due  Topic Date Due  . COVID-19 Vaccine (1) Never done  . TETANUS/TDAP  Never done  . COLONOSCOPY (Pts 45-13yr Insurance coverage will need to be confirmed)  Never done  . MAMMOGRAM  Never done  . DEXA SCAN  Never done  . PNA vac Low Risk Adult (1 of 2 - PCV13) Never done  . INFLUENZA VACCINE  Never done    There are no preventive care reminders to display for this patient.  Lab Results  Component Value Date   TSH 0.531 06/04/2020   Lab Results  Component Value Date   WBC 4.1 06/24/2020   HGB 12.9 06/24/2020   HCT 39.5 06/24/2020   MCV 91.9 06/24/2020   PLT 234 06/24/2020   Lab Results  Component Value Date   NA 136 06/24/2020   K 4.2 06/24/2020   CO2 29 06/24/2020   GLUCOSE 96 06/24/2020   BUN 14 06/24/2020   CREATININE 0.96 06/24/2020   BILITOT 0.4 06/24/2020   ALKPHOS 117 06/24/2020   AST 16 06/24/2020   ALT 15 06/24/2020   PROT 6.7 06/24/2020   ALBUMIN 3.7 06/24/2020   CALCIUM 8.1 (L) 06/24/2020   ANIONGAP 7 06/24/2020   No results found for: CHOL No results found for: HDL No results found for: LDLCALC No results found for: TRIG No results found for: CHOLHDL No results found for: HGBA1C    Assessment & Plan:   Problem List Items Addressed This Visit   None   Visit Diagnoses    Severe pain    -  Primary   Relevant Medications   cyclobenzaprine (FLEXERIL) 5 MG tablet   Severe anxiety       Relevant Medications   ALPRAZolam (XANAX) 0.5 MG tablet   Other Relevant Orders   Ambulatory referral to Psychology   Ambulatory referral to  Psychiatry      Meds ordered this encounter  Medications  . ALPRAZolam (XANAX) 0.5 MG tablet    Sig: Take 1 tablet (0.5 mg total) by mouth 3 (three) times daily as needed for anxiety.    Dispense:  15 tablet    Refill:  0  . cyclobenzaprine (FLEXERIL) 5 MG tablet    Sig: Take 1 tablet (5 mg total) by mouth 3 (three) times daily.    Dispense:  30 tablet    Refill:  0    Follow-up: No follow-ups on file.   PLAN  Refill short course of alprazolam and flexeril  Continue to follow with pain management  Continue to follow with Dr. KIrene Lester Will refer to psychology and psychiatry - discussed benefits of these with patient, even if short term  Patient encouraged to call clinic with any questions, comments, or concerns.  RMaximiano Coss NP

## 2020-07-01 NOTE — Patient Instructions (Signed)
° ° ° °  If you have lab work done today you will be contacted with your lab results within the next 2 weeks.  If you have not heard from us then please contact us. The fastest way to get your results is to register for My Chart. ° ° °IF you received an x-ray today, you will receive an invoice from [facility]. Please contact  Radiology at [phone] with questions or concerns regarding your invoice.  ° °IF you received labwork today, you will receive an invoice from [facility]. Please contact [facility] at [phone] with questions or concerns regarding your invoice.  ° °Our billing staff will not be able to assist you with questions regarding bills from these companies. ° °You will be contacted with the lab results as soon as they are available. The fastest way to get your results is to activate your My Chart account. Instructions are located on the last page of this paperwork. If you have not heard from us regarding the results in 2 weeks, please contact this office. °  ° ° ° °

## 2020-07-03 ENCOUNTER — Other Ambulatory Visit: Payer: Medicare Other | Admitting: Hospice

## 2020-07-03 ENCOUNTER — Other Ambulatory Visit: Payer: Self-pay

## 2020-07-03 DIAGNOSIS — C9000 Multiple myeloma not having achieved remission: Secondary | ICD-10-CM

## 2020-07-03 DIAGNOSIS — Z515 Encounter for palliative care: Secondary | ICD-10-CM

## 2020-07-03 NOTE — Progress Notes (Signed)
[facility] Consult Note Telephone: [phone]  Fax: [phone]  PATIENT NAME: Lisa Lester DOB: 1945/09/25 MRN: [id number]  PRIMARY CARE PROVIDER:   [name], NP [name], NP [place],  [place] [address]   REFERRING PROVIDER:   [name], NP [place],  [place] [address] [phone]  RESPONSIBLE PARTY: Self HCPOA/ Extended Emergency Contact Information Primary Emergency Contact: greer,doug Mobile Phone: [phone] Relation: Nephew Preferred language: English Interpreter needed? No Secondary Emergency Contact: greer,julie Mobile Phone: [phone] Relation: Niece Preferred language: English Interpreter needed? No  [name] is a Therapist, sports  I met face to face with patient and family in home/facility.  RECOMMENDATIONS/PLAN:    Visit to build trust and follow-up on palliative care. Patient relocated from [place] to [place] to be with family as she goes through treatment for multiple myeloma.   Patient shared that chemo therapy/radiation is ongoing but has been a challenging experience having to deal with the side effects of nausea vomiting.   Has spirituality and her dog help her to cope..  Encouraged her to draw on these coping mechanisms as she undergoes her treatment. She hopes to go back to [place].  Therapeutic listening and ample emotional support provided.  Palliative care team will continue to support patient, patient's family, and medical team. Advance Care Planning/CODE STATUS:  CODE STATUS reviewed.  Patient is a full code.    GOALS OF CARE: Goals of care include to maximize quality of life and symptom management.  Patient desires to go back to [place] when she gets stronger or to go back to [place] and continue with her chemo treatments.  Follow up Palliative Care Visit: Palliative care will continue to follow for goals of care clarification and symptom management.  Patient  requested follow-up after the holidays-in 2 months/as needed  Functional decline/Symptom Management:  Chart review indicates recent hospitalization 12/7-12/21/2021 for altered mental status, acute psychosis.  Patient at baseline; follow-up with psychiatric.   Patient has started chemotherapy/radiation for multiple myeloma; uses Zofran and Compazine.;  Tolerating chemo well. Fatigue: Related to ongoing chemo/radiation.  Education on balance of performance activity and rest.  Follow-up appointment with oncologist 3 PM today.   Patient denied pain during visit, in no respiratory distress.  Recent CT scan of the cervical spine and CTA showed multiple lytic lesion with concern for fracture of C1.  Neck brace in place during visit. Anxiety: Continue deep breathing, meditation.  Continue Xanax as ordered. She is ambulatory without assistive device.  Palliative will continue to monitor for symptom management/decline and make recommendations as needed.   Family /Caregiver/Community Supports: Strong family support system identified.  Patient moved from [place] to [place] for treatment.  She lives at home with her nephew Marden Noble and his wife [name].  They are involved in patient's care.  I spent  46 minutes providing this initial consultation; time includes time spent with patient/family, chart review, and documentation. More than 50% of the time in this consultation was spent on coordinating communication  CHIEF COMPLAIN/HISTORY OF PRESENT ILLNESS:  Lisa Lester is a 75 y.o. female with multiple medical problems including multiple myeloma, generalized body pain, anxiety. Palliative Care was asked to follow this patient by consultation request of [name], NP to help address advance care planning and goals of care.  CODE STATUS: Full code  PPS: 60% HOSPICE ELIGIBILITY/DIAGNOSIS: TBD  PAST MEDICAL HISTORY:  Past Medical History:  Diagnosis Date  . Multiple myeloma ([place])   .  Pathologic  fracture    L3  . 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 Smoking status: Never Smoker  . Smokeless tobacco: Never Used  Substance Use Topics  . Alcohol use: Never    ALLERGIES: No Known Allergies   PERTINENT MEDICATIONS:  Outpatient Encounter Medications as of 07/03/2020  Medication Sig  . acyclovir (ZOVIRAX) 400 MG tablet Take 400 mg by mouth 2 (two) times daily.  Marland Kitchen ALPRAZolam (XANAX) 0.5 MG tablet Take 1 tablet (0.5 mg total) by mouth 3 (three) times daily as needed for anxiety.  Marland Kitchen aspirin EC 81 MG tablet Take 81 mg by mouth daily. Swallow whole.  . cyclobenzaprine (FLEXERIL) 5 MG tablet Take 1 tablet (5 mg total) by mouth 3 (three) times daily.  . DULoxetine (CYMBALTA) 30 MG capsule Take 1 capsule (30 mg total) by mouth daily.  . fentaNYL (DURAGESIC) 25 MCG/HR Place 1 patch onto the skin every 3 (three) days.  Marland Kitchen lenalidomide (REVLIMID) 15 MG capsule TAKE 1 CAPSULE BY MOUTH  DAILY FOR 14 DAYS, THEN 7  DAYS OFF REPEAT EVERY 21  DAYS  . lidocaine (LIDODERM) 5 % Place 1 patch onto the skin daily. Remove & Discard patch within 12 hours or as directed by MD on the neck  . meloxicam (MOBIC) 15 MG tablet Take 15 mg by mouth daily.  . ondansetron (ZOFRAN) 8 MG tablet Take 8 mg by mouth every 8 (eight) hours as needed for nausea or vomiting.  Marland Kitchen oxyCODONE-acetaminophen (PERCOCET/ROXICET) 5-325 MG tablet Take 1 tablet by mouth every 6 (six) hours as needed for up to 39 doses for moderate pain or severe pain.  . pantoprazole (PROTONIX) 40 MG tablet Take 1 tablet (40 mg total) by mouth daily.  . polyethylene glycol (MIRALAX) 17 g packet Take 17 g by mouth daily. (Patient taking differently: Take 17 g by mouth daily as needed for mild constipation.)  . prochlorperazine (COMPAZINE) 10 MG tablet Take 10 mg by mouth every 6 (six) hours as needed for nausea or vomiting.  . senna-docusate (SENNA S) 8.6-50 MG tablet Take 2 tablets by mouth 2 (two) times daily.   No  facility-administered encounter medications on file as of 07/03/2020.    PHYSICAL EXAM / ROS:  Patient did not want vital signs checked. Height:    5 feet 5 inches  Weight: 155Lbs per last visit in November 2021 General: In no acute distress,  cooperative Cardiovascular: regular rate and rhythm, no chest pain reported Pulmonary: no cough, no shortness of breath, normal respiratory effort Musculoskeletal: Neck brace in place.   Neurological: Weakness but otherwise non focal Note:  Portions of this note were generated with Lobbyist. Dictation errors may occur despite attempts at proofreading.  Teodoro Spray, NP

## 2020-07-04 ENCOUNTER — Inpatient Hospital Stay: Payer: Medicare Other

## 2020-07-04 ENCOUNTER — Other Ambulatory Visit: Payer: Self-pay

## 2020-07-04 ENCOUNTER — Inpatient Hospital Stay: Payer: Medicare Other | Attending: Hematology | Admitting: Hematology

## 2020-07-04 VITALS — BP 141/70 | HR 110 | Temp 98.1°F | Resp 17 | Ht 65.0 in | Wt 149.7 lb

## 2020-07-04 DIAGNOSIS — E538 Deficiency of other specified B group vitamins: Secondary | ICD-10-CM | POA: Diagnosis not present

## 2020-07-04 DIAGNOSIS — R110 Nausea: Secondary | ICD-10-CM | POA: Diagnosis not present

## 2020-07-04 DIAGNOSIS — Z8616 Personal history of COVID-19: Secondary | ICD-10-CM | POA: Diagnosis not present

## 2020-07-04 DIAGNOSIS — Z5111 Encounter for antineoplastic chemotherapy: Secondary | ICD-10-CM

## 2020-07-04 DIAGNOSIS — C9000 Multiple myeloma not having achieved remission: Secondary | ICD-10-CM

## 2020-07-04 DIAGNOSIS — Z7982 Long term (current) use of aspirin: Secondary | ICD-10-CM | POA: Diagnosis not present

## 2020-07-04 DIAGNOSIS — R0982 Postnasal drip: Secondary | ICD-10-CM | POA: Insufficient documentation

## 2020-07-04 DIAGNOSIS — K5900 Constipation, unspecified: Secondary | ICD-10-CM | POA: Diagnosis not present

## 2020-07-04 DIAGNOSIS — Z79899 Other long term (current) drug therapy: Secondary | ICD-10-CM | POA: Insufficient documentation

## 2020-07-04 LAB — CBC WITH DIFFERENTIAL/PLATELET
Abs Immature Granulocytes: 0.02 10*3/uL (ref 0.00–0.07)
Basophils Absolute: 0 10*3/uL (ref 0.0–0.1)
Basophils Relative: 1 %
Eosinophils Absolute: 0.2 10*3/uL (ref 0.0–0.5)
Eosinophils Relative: 5 %
HCT: 38.4 % (ref 36.0–46.0)
Hemoglobin: 12.4 g/dL (ref 12.0–15.0)
Immature Granulocytes: 1 %
Lymphocytes Relative: 14 %
Lymphs Abs: 0.5 10*3/uL — ABNORMAL LOW (ref 0.7–4.0)
MCH: 29.6 pg (ref 26.0–34.0)
MCHC: 32.3 g/dL (ref 30.0–36.0)
MCV: 91.6 fL (ref 80.0–100.0)
Monocytes Absolute: 0.6 10*3/uL (ref 0.1–1.0)
Monocytes Relative: 16 %
Neutro Abs: 2.3 10*3/uL (ref 1.7–7.7)
Neutrophils Relative %: 63 %
Platelets: 134 10*3/uL — ABNORMAL LOW (ref 150–400)
RBC: 4.19 MIL/uL (ref 3.87–5.11)
RDW: 13.5 % (ref 11.5–15.5)
WBC: 3.6 10*3/uL — ABNORMAL LOW (ref 4.0–10.5)
nRBC: 0 % (ref 0.0–0.2)

## 2020-07-04 LAB — CMP (CANCER CENTER ONLY)
ALT: 8 U/L (ref 0–44)
AST: 11 U/L — ABNORMAL LOW (ref 15–41)
Albumin: 3.7 g/dL (ref 3.5–5.0)
Alkaline Phosphatase: 136 U/L — ABNORMAL HIGH (ref 38–126)
Anion gap: 10 (ref 5–15)
BUN: 10 mg/dL (ref 8–23)
CO2: 23 mmol/L (ref 22–32)
Calcium: 8.4 mg/dL — ABNORMAL LOW (ref 8.9–10.3)
Chloride: 101 mmol/L (ref 98–111)
Creatinine: 0.88 mg/dL (ref 0.44–1.00)
GFR, Estimated: >60 mL/min (ref >60)
Glucose, Bld: 91 mg/dL (ref 70–99)
Potassium: 4.2 mmol/L (ref 3.5–5.1)
Sodium: 134 mmol/L — ABNORMAL LOW (ref 135–145)
Total Bilirubin: 0.6 mg/dL (ref 0.3–1.2)
Total Protein: 6.5 g/dL (ref 6.5–8.1)

## 2020-07-04 MED ORDER — B-12 1000 MCG SL SUBL: 1000.0000 ug | SUBLINGUAL_TABLET | Freq: Every day | SUBLINGUAL | 2 refills | Status: DC

## 2020-07-04 NOTE — Progress Notes (Signed)
HEMATOLOGY/ONCOLOGY CONSULTATION NOTE  Date of Service: 07/04/2020  Patient Care Team: [name], NP as PCP - General (Adult Health Nurse Practitioner)  CHIEF COMPLAINTS/PURPOSE OF CONSULTATION:  Multiple Myeloma  HISTORY OF PRESENTING ILLNESS:  Lisa Lester is a wonderful 75 y.o. female who has been referred to [place] by Dr. 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 Pt is accompanied today by her nephew. The pt reports that she is doing well overall.   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 To treat this the pt had a Kyphoplasty and radiation therapy. Pt also had a BM Bx, urinalysis, and a bone scan completed at the time. There was no official diagnosis of MGUS or Multiple Myeloma at this time, but they continued to monitor.   A few weeks ago ptt began having significant pain in her chest, which was thought to be a fractured sternum. Pt describes the pain as "flesh being ripped off of her bones". She was also having left-sided neck and back discomfort at this time. Pt was taken to the hospital where her pain was treated and she received a right Iliac Bx, BM Bx, and CT Chest while admitted. They did not complete any whole body imaging at this time.   She had a root canal nearly 1.5 months ago. Pt discontinued taking Thyroid replacement 5-6 years ago. Pt is also no longer taking any HTN medications. She was asked to restart Gabapentin yesterday, which she was given for pain control, but felt ill after taking it. She continues taking Xanax and Hydrocodone every 4 hours. Pt reports some constipation and takes Colace occasionally.   Pt had COVID19 in July of 2020. Her husband also had the virus which caused his passing. Pt has received two doses of the COVID19 vaccine but has not received the booster.  Of note since the patient's last visit, pt has had CT Chest ([id number]) completed on 04/09/2020  with results revealing "1. Chronic posterior surgical fusion of L2 and L4 with bilateral transpedicular screws and bridging rods. Hardware intact appearing. 2. Status post vertebroplasty of L3. There is an underlying lytic lesion at the L3 vertebral body which extends into its left pedicle. These findings correlate with the provided history of a known plasmacytoma. 3. There are numerous lytic skeletal lesions at the chest, abdomen and pelvis, more numerous at the spine and ribs. 4. Multiple lytic lesions are present at the sternum, with interval development of a pathologic fracture at the sternum without significant displacement. 5. Stable mild superior endplate compression deformity at T11. 6. 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 Similar findings were present on prior study from 03/30/12, the degree of biliary ductal dilation has worsened as compared to the prior study. No choledochal stone identified.   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  Most recent lab results (04/11/2020) of CBC w/diff is as follows: all values are WNL except for Lymphs Rel at  18.5. 04/10/2020 CMP is as follows: all values are WNL except for CO2 at 33, Glucose at 120, Calcium at 11.6, Anion gap at 3.  On review of systems, pt reports constipation, chest pain, neck pain, back pain and denies dysuria and any other symptoms.   On PMHx the pt reports Kyphoplasty,  Plasmacytoma, HTN, Thyroid disorder. On Social Hx the pt reports that she is a non-smoker and only drinks alcohol socially.   INTERVAL HISTORY:  Lisa Lester is a wonderful 75 y.o. 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 Pt is here for f/u after recent hospitalization. The patient's last visit with [place] was on 05/28/2020. The pt reports that she is doing well overall.  The pt reports improvement in her neck pain after starting radiation therapy to her C1 vertebrae. The pt was seen by Neurosurgery yesterday.   Pt completed her last dose of Revlimid yesterday for this cycle. Pt continues to use Fentanyl Patch 25 mcg/hr as prescribed and Percocet as needed. She continues to experience nausea despite using Compazine & Zofran. She also notes a post-nasal drip that could be contributing to her nausea. Pt is taking Senna S tablets at night that is keeping her fairly regular but she admits that she still experiences occasional constipation. She is experiencing a metallic taste that is keeping her from eating well. She has not been utilizing any nutritional supplements. Pt admits that she has not been taking Vitamin B12 as recommended.   Lab results today (07/04/20) of CBC w/diff and CMP is as follows: all values are WNL except for WBC at 3.6K, PLT at 134K, Lymphs Abs at 0.5K, Sodium at 134, Calcium at 8.4, AST at 11, ALP at 136. 07/04/2020 MMP is in progress 07/04/2020 K/L light chains-- kappa LC down from 865 to 20.7  On review of systems, pt reports improved neck pain, nausea, occasional constipation, dysgeusia, fatigue and denies any other symptoms.   MEDICAL HISTORY:  Past Medical History:  Diagnosis Date  . Multiple myeloma ([place])   . Pathologic fracture    L3  . Plasmacytoma ([place])     SURGICAL HISTORY: Past Surgical History:  Procedure Laterality Date  . BACK SURGERY    . BREAST ENHANCEMENT SURGERY Bilateral   . LUMBAR FUSION     L2 - L4, hx of pathologic fx  of L3  . RADIOLOGY WITH ANESTHESIA N/A 06/13/2020   Procedure: MRI BRAIN WITHOUT; C-SPINE WITHOUT;  Surgeon: Radiologist, Medication, MD;  Location: [facility];  Service: Radiology;  Laterality: N/A;    SOCIAL HISTORY: Social History   Socioeconomic History  . Marital status: Widowed    Spouse name: russell Failla  . Number of children: 0  . Years of education: 57  . Highest education level: 12th grade  Occupational History  . Not on file  Tobacco Use  . Smoking status: Never Smoker  . Smokeless tobacco: Never Used  Vaping Use  . Vaping Use: Never used  Substance and Sexual Activity  . Alcohol use: Never  . Drug use: Never  . Sexual activity: Not Currently  Other Topics Concern  . Not on file  Social History Narrative  . Not on file   Social Determinants of Health   Financial Resource Strain: Not on file  Food Insecurity: Not on file  Transportation Needs: Not on file  Physical Activity: Not on file  Stress: Not on file  Social Connections: Not on file  Intimate Partner Violence: Not on file  FAMILY HISTORY: Family History  Problem Relation Age of Onset  . Cancer - Other Mother   . Cancer - Other Father     ALLERGIES:  has No Known Allergies.  MEDICATIONS:  Current Outpatient Medications  Medication Sig Dispense Refill  . Cyanocobalamin (B-12) 1000 MCG SUBL Place 1,000 mcg under the tongue daily. 60 tablet 2  . acyclovir (ZOVIRAX) 400 MG tablet Take 400 mg by mouth 2 (two) times daily.    Marland Kitchen ALPRAZolam (XANAX) 0.5 MG tablet Take 1 tablet (0.5 mg total) by mouth 3 (three) times daily as needed for anxiety. 15 tablet 0  . aspirin EC 81 MG tablet Take 81 mg by mouth daily. Swallow whole.    . cyclobenzaprine (FLEXERIL) 5 MG tablet Take 1 tablet (5 mg total) by mouth 3 (three) times daily. 30 tablet 0  . DULoxetine (CYMBALTA) 30 MG capsule Take 1 capsule (30 mg total) by mouth daily. 30 capsule 3  . fentaNYL (DURAGESIC) 25 MCG/HR Place 1 patch onto the skin every 3  (three) days. 10 patch 0  . lenalidomide (REVLIMID) 15 MG capsule TAKE 1 CAPSULE BY MOUTH  DAILY FOR 14 DAYS, THEN 7  DAYS OFF REPEAT EVERY 21  DAYS 14 capsule 0  . lidocaine (LIDODERM) 5 % Place 1 patch onto the skin daily. Remove & Discard patch within 12 hours or as directed by MD on the neck 30 patch 5  . meloxicam (MOBIC) 15 MG tablet Take 15 mg by mouth daily.    . ondansetron (ZOFRAN) 8 MG tablet Take 8 mg by mouth every 8 (eight) hours as needed for nausea or vomiting.    Marland Kitchen oxyCODONE-acetaminophen (PERCOCET/ROXICET) 5-325 MG tablet Take 1 tablet by mouth every 6 (six) hours as needed for up to 39 doses for moderate pain or severe pain. 30 tablet 0  . pantoprazole (PROTONIX) 40 MG tablet Take 1 tablet (40 mg total) by mouth daily. 30 tablet 1  . polyethylene glycol (MIRALAX) 17 g packet Take 17 g by mouth daily. (Patient taking differently: Take 17 g by mouth daily as needed for mild constipation.) 30 each 1  . prochlorperazine (COMPAZINE) 10 MG tablet Take 10 mg by mouth every 6 (six) hours as needed for nausea or vomiting.    . senna-docusate (SENNA S) 8.6-50 MG tablet Take 2 tablets by mouth 2 (two) times daily. 60 tablet 2   No current facility-administered medications for this visit.    REVIEW OF SYSTEMS:   A 10+ POINT REVIEW OF SYSTEMS WAS OBTAINED including neurology, dermatology, psychiatry, cardiac, respiratory, lymph, extremities, GI, GU, Musculoskeletal, constitutional, breasts, reproductive, HEENT.  All pertinent positives are noted in the HPI.  All others are negative.   PHYSICAL EXAMINATION: ECOG PERFORMANCE STATUS: 2 - Symptomatic, <50% confined to bed  . Vitals:   07/04/20 1447  BP: (!) 141/70  Pulse: (!) 110  Resp: 17  Temp: 98.1 F (36.7 C)  SpO2: 98%   Filed Weights   07/04/20 1447  Weight: 149 lb 11.2 oz (67.9 kg)   .Body mass index is 24.91 kg/m.  GENERAL:alert, in no acute distress and comfortable SKIN: no acute rashes, no significant lesions EYES:  conjunctiva are pink and non-injected, sclera anicteric OROPHARYNX: MMM, no exudates, no oropharyngeal erythema or ulceration, white patches in mouth - early thrush? NECK: supple, no JVD LYMPH:  no palpable lymphadenopathy in the cervical, axillary or inguinal regions LUNGS: clear to auscultation b/l with normal respiratory effort HEART: regular rate & rhythm ABDOMEN:  normoactive bowel sounds , non tender, not distended. No palpable hepatosplenomegaly.  Extremity: no pedal edema PSYCH: alert & oriented x 3 with fluent speech NEURO: no focal motor/sensory deficits  LABORATORY DATA:  I have reviewed the data as listed  . CBC Latest Ref Rng & Units 07/04/2020 06/24/2020 06/20/2020  WBC 4.0 - 10.5 K/uL 3.6(L) 4.1 2.7(L)  Hemoglobin 12.0 - 15.0 g/dL 12.4 12.9 12.6  Hematocrit 36.0 - 46.0 % 38.4 39.5 38.8  Platelets 150 - 400 K/uL 134(L) 234 343    . CMP Latest Ref Rng & Units 07/04/2020 06/24/2020 06/20/2020  Glucose 70 - 99 mg/dL 91 96 98  BUN 8 - 23 mg/dL '10 14 10  ' Creatinine 0.44 - 1.00 mg/dL 0.88 0.96 0.94  Sodium 135 - 145 mmol/L 134(L) 136 138  Potassium 3.5 - 5.1 mmol/L 4.2 4.2 4.3  Chloride 98 - 111 mmol/L 101 100 103  CO2 22 - 32 mmol/L '23 29 29  ' Calcium 8.9 - 10.3 mg/dL 8.4(L) 8.1(L) 9.1  Total Protein 6.5 - 8.1 g/dL 6.5 6.7 7.2  Total Bilirubin 0.3 - 1.2 mg/dL 0.6 0.4 0.2(L)  Alkaline Phos 38 - 126 U/L 136(H) 117 135(H)  AST 15 - 41 U/L 11(L) 16 16  ALT 0 - 44 U/L '8 15 27     ' RADIOGRAPHIC STUDIES: I have personally reviewed the radiological images as listed and agreed with the findings in the report. CT ANGIO NECK W OR WO CONTRAST  Result Date: 06/15/2020 CLINICAL DATA:  Chronic neck pain. Possible C1 fracture. Multiple myeloma. EXAM: CT ANGIOGRAPHY NECK TECHNIQUE: Multidetector CT imaging of the neck was performed using the standard protocol during bolus administration of intravenous contrast. Multiplanar CT image reconstructions and MIPs were obtained to evaluate the  vascular anatomy. Carotid stenosis measurements (when applicable) are obtained utilizing NASCET criteria, using the distal internal carotid diameter as the denominator. CONTRAST:  141m OMNIPAQUE IOHEXOL 350 MG/ML SOLN COMPARISON:  Cervical MRI 06/13/2020 FINDINGS: Aortic arch: Mild aortic atherosclerosis. Branching pattern is normal, with the variant of the left vertebral artery arising directly from the arch. No origin stenosis ease. Right carotid system: Common carotid artery widely patent to the bifurcation. Carotid bifurcation is normal. Cervical ICA is normal. Left carotid system: Common carotid artery widely patent to the bifurcation. The bifurcation is normal. Cervical ICA is normal. Vertebral arteries: Right vertebral artery origin widely patent. Left vertebral artery arises from the arch as noted above. Origin widely patent. Both vertebral arteries are widely patent through the cervical region. Tumor involvement on the left at C4 does involve the transverse foramen slightly but does not cause any contour change of the vertebral artery. Tumor involvement of the transverse foramen on the left at C2 is associated with occlusion of the left vertebral artery. There is some reconstituted flow within the left vertebral artery at the foramen magnum. Skeleton: Myeloma involvement throughout the cervical spine as will be described on the cervical MRI. Compression fracture T3. Other neck: No extra-spinal soft tissue mass or lymphadenopathy. Upper chest: Lung apices are clear. IMPRESSION: 1. Myeloma involvement throughout the cervical spine as will be described on the cervical CT. 2. Tumor involvement of the transverse foramen on the left at C2 is associated with occlusion of the left vertebral artery. There is some reconstituted flow within the left vertebral artery at the foramen magnum. 3. No carotid bifurcation disease. 4. Right vertebral artery widely patent to the basilar. Reconstituted or retrograde flow within  the distal left vertebral artery back as  far as the foramen magnum. Aortic Atherosclerosis (ICD10-I70.0). Electronically Signed   By: Nelson Chimes M.D.   On: 06/15/2020 16:14   MR BRAIN WO CONTRAST  Result Date: 06/13/2020 CLINICAL DATA:  Mental status change, unknown cause acute psychosis, agitation, has multiple myeloma, unable to lie down flat due to persistent neck pain despite Pain management EXAM: MRI HEAD WITHOUT CONTRAST TECHNIQUE: Multiplanar, multiecho pulse sequences of the brain and surrounding structures were obtained without intravenous contrast. COMPARISON:  06/04/2020 head CT.  Concurrent MRI cervical spine. FINDINGS: Brain: No acute infarct. No intracranial hemorrhage. No midline shift, ventriculomegaly or extra-axial fluid collection. No mass lesion. Mild cerebral atrophy with ex vacuo dilatation. Minimal chronic microvascular ischemic changes. Vascular: Left V4 segment T2 hyperintense signal may reflect slow flow versus chronic occlusion. Remaining major intracranial flow voids proximally preserved. Left cerebellar developmental venous anomaly. Skull: Scattered subcentimeter T2 hyperintense calvarial lesions (for example 6:18) likely reflect myomatous sequela. Please see concurrent MRI cervical spine for findings below the foramen magnum. Sinuses/Orbits: Normal orbits. Mild ethmoid and left sphenoid sinus mucosal thickening. Trace bilateral mastoid free fluid. Other: None. IMPRESSION: No acute intracranial process. Minimal chronic microvascular ischemic changes. Multifocal subcentimeter calvarial myomatous lesions. Left V4 segment slow flow versus chronic occlusion/high-grade narrowing. Electronically Signed   By: Primitivo Gauze M.D.   On: 06/13/2020 10:48   MR CERVICAL SPINE WO CONTRAST  Addendum Date: 06/13/2020   ADDENDUM REPORT: 06/13/2020 11:15 ADDENDUM: These results were called by telephone at the time of interpretation on 06/13/2020 at 11:12 am to provider Pokhrel, Corrie Mckusick,  MD , who verbally acknowledged these results. Electronically Signed   By: Primitivo Gauze M.D.   On: 06/13/2020 11:15   Result Date: 06/13/2020 CLINICAL DATA:  acute psychosis, agitation, has multiple myeloma, unable to lie down flat due to persistent neck pain despite Pain management. 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 No intravenous contrast was administered. COMPARISON:  Concurrent MRI head. FINDINGS: Alignment: Normal. Vertebrae: Multifocal T1 hypointense/STIR hyperintense osseous lesions. Chronic T3 compression deformity with moderate height loss. Bone marrow edema involving the left lateral C1 mass with associated compression deformity likely reflects subacute pathologic fracture. Bone marrow edema extends to the left transverse foramen with redemonstration of T2 hyperintense left V4/V3 segment signal. Mild reactive perivertebral edema at this level. Remaining vertebral body heights are preserved. Cord: Normal signal and morphology. Posterior Fossa, vertebral arteries: Please see concurrent MRI head for findings above the foramen magnum. Disc levels: Multilevel desiccation and disc space loss. C2-3: Disc osteophyte complex abutting the ventral cord and bilateral facet degenerative spurring. Prominent ligamentum flavum. Mild spinal canal and right neural foraminal narrowing. Patent left neural foramen. C3-4: Disc osteophyte complex abutting the ventral cord with uncovertebral and facet degenerative spurring. Prominent ligamentum flavum. Mild spinal canal and bilateral neural foraminal narrowing. C4-5: Disc osteophyte complex with uncovertebral and facet hypertrophy. Mild spinal canal and moderate right and severe left neural foraminal narrowing. C5-6: Disc osteophyte complex with uncovertebral and facet hypertrophy. Superimposed right paracentral protrusion abutting the  ventral cord. Mild spinal canal and moderate bilateral neural foraminal narrowing. C6-7: Disc osteophyte complex with uncovertebral and facet hypertrophy. Superimposed right foraminal protrusion. Mild spinal canal, moderate right and mild left neural foraminal narrowing. C7-T1: No significant disc bulge. Uncovertebral and facet degenerative spurring. Mild left neural foraminal narrowing. Patent spinal canal and right neural foramen. Paraspinal tissues: Negative. IMPRESSION: Multifocal myomatous lesions with subacute  pathologic fracture deformity of the left lateral C1 mass likely involving the left transverse foramen. Left V3/V4 segment T2 hyperintense signal is concerning for high-grade narrowing/occlusion. Further evaluation with CTA neck is recommended to exclude vascular injury. Chronic T3 compression deformity with moderate height loss. Multilevel spondylosis as detailed above. Electronically Signed: By: Primitivo Gauze M.D. On: 06/13/2020 11:08   CT C-SPINE NO CHARGE  Result Date: 06/15/2020 CLINICAL DATA:  Multiple myeloma. Left vertebral artery occlusion at C2. C1 fracture previously shown by cervical MRI. EXAM: CT CERVICAL SPINE WITHOUT CONTRAST TECHNIQUE: Multidetector CT imaging of the cervical spine was performed without intravenous contrast. Multiplanar CT image reconstructions were also generated. COMPARISON:  CT angiography same day.  Cervical MRI 2 days ago. FINDINGS: Alignment: Normal Skull base and vertebrae: Lytic lesions throughout the skeleton consistent with diffuse multiple myeloma. Major findings will be described at each level below. Pathologic fracture the left lateral mass of C1. Soft tissues and spinal canal: No soft tissue neck lesion. No evidence of spinal canal compromise. Disc levels: Skull base and foramen magnum: Small lytic lesion at the tip of the clivus. Foramen magnum sufficiently patent. C1-2: Lytic destruction with pathologic fracture of the left lateral mass of C1  and the left side of the anterior arch. Extensive lytic change within the C2 vertebra which could place the patient at risk of pathologic fracture. Tumor involves the transverse foramen on the left where there is occlusion of the left vertebral artery shown by CT angiography. C3: Lytic lesions, most pronounced in the left anterior vertebral body. No canal compromise. C4: Lytic disease most pronounced on the left, with tumor extending into the transverse foramen and surrounding the left vertebral artery. No vertebral artery constriction or occlusion at this level. C5: Small lytic lesions. C6: Small lytic lesions. C7: Small lytic lesions. T1: Large lytic lesion of the anterior vertebral body. No fracture. No encroachment upon the neural structures. T2: Small lytic lesions. T3: Chronic pathologic compression fracture. No encroachment upon the canal or foramina. Upper chest: Lung apices are clear. Other: None IMPRESSION: 1. Lytic lesions throughout the skeleton consistent with diffuse multiple myeloma. 2. Pathologic fractures of the left lateral mass of C1 and the left side of the anterior arch of C1. Extensive lytic change within the C2 vertebra which could place the patient at risk of pathologic fracture of C2. Tumor involves the left transverse foramen at C2 in the left vertebral artery is occluded in this location. Tumor also surrounds the left vertebral artery in the transverse foramen C4 but there is no narrowing or near occlusion at this level. 3. Multiple lytic lesions throughout the skeleton as outlined in detail above. 4. Chronic pathologic compression fracture at T3. No encroachment upon the canal or foramina. Electronically Signed   By: Nelson Chimes M.D.   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  Previous h/o plasmacytomas 05/15/2020 M Protein at 0.7 g/dL 05/24/2020 PET/CT  ([id number]) revealed "1. There are multiple hypermetabolic lytic osseous lesions consistent with multiple myeloma. The most prominent lesion is within the left lateral mass of C1 and this likely involves the foramen transversarium. Probable T3 and sternal pathologic fractures."  2) C1 myeloma lesion  PLAN: -Discussed pt labwork today, 07/04/20; all values are WNL except for WBC at 3.6K, PLT at 134K, Lymphs Abs at 0.5K, Sodium at 134, Calcium at 8.4, AST  at 11, ALP at 136. -Discussed 07/04/2020 MMP & K/L light chain -- improving -The pt has no prohibitive toxicities from continuing C3D1 VRD in 4 days.  -Advised pt that standard induction treatment is 4-6 cycles, but could be more or less depending on response.  -Will add [place] Vitamin B12 on D1 and D8 of each treatment. -Advised pt that fatigue is caused by myeloma and treatment.  -Recommended that the pt continue to eat well, drink at least 48-64 oz of water each day, and walk 20-30 minutes each day.  -Advised pt that steroids can cause confusion in susceptible populations.  -Advised pt that she can get 4% Lidocaine patches OTC.  -Recommend salt/baking soda mouth rinses  -Recommend pt drink buttermilk or eat more yogurt -Recommend pt begin 1000 mcg Vitamin B12 & one B-complex daily  -Continue daily baby ASA and Acyclovir BID -Continue Zometa every 4 weeks -Rx Vitamin B12, antifungal mouthwash -Will see back in 2 weeks via phone    FOLLOW UP: Plz schedule C3 and C3 of VRD as per orders (C3D1 on 1/10) Labs on D1 and D8 of each cycle of treatment Phone visit with Dr Irene Limbo on C3D15 in about 2 weeks COntinue Zometa q4 weeks x 6 B12  injections D1 and D8 of each cycle of treatment.   The total time spent in the appt was 45 minutes and more than 50% was on counseling and direct patient cares.  All of the patient's questions were answered with apparent satisfaction. The patient knows to call the clinic with any problems, questions or  concerns.    Sullivan Lone MD [place] [facility]IVMS [facility] Hematology/Oncology Physician [facility]  (Office):       [phone] (Work cell):  [phone] (Fax):           [phone]  07/04/2020 3:37 PM   I, Yevette Edwards, am acting as a scribe for Dr. Sullivan Lone.   .I have reviewed the above documentation for accuracy and completeness, and I agree with the above. Brunetta Genera MD

## 2020-07-05 ENCOUNTER — Other Ambulatory Visit: Payer: Self-pay | Admitting: *Deleted

## 2020-07-05 DIAGNOSIS — C9000 Multiple myeloma not having achieved remission: Secondary | ICD-10-CM

## 2020-07-05 LAB — KAPPA/LAMBDA LIGHT CHAINS
Kappa free light chain: 20.7 mg/L — ABNORMAL HIGH (ref 3.3–19.4)
Kappa, lambda light chain ratio: 1.85 — ABNORMAL HIGH (ref 0.26–1.65)
Lambda free light chains: 11.2 mg/L (ref 5.7–26.3)

## 2020-07-05 MED ORDER — B-12 1000 MCG SL SUBL: 1000.0000 ug | SUBLINGUAL_TABLET | Freq: Every day | SUBLINGUAL | 2 refills | Status: DC

## 2020-07-05 MED ORDER — NYSTATIN 100000 UNIT/ML MT SUSP: 5.0000 mL | Freq: Four times a day (QID) | OROMUCOSAL | 0 refills | Status: DC

## 2020-07-05 MED ORDER — LENALIDOMIDE 15 MG PO CAPS: ORAL_CAPSULE | ORAL | 0 refills | Status: DC

## 2020-07-05 NOTE — Telephone Encounter (Signed)
Revlimid 15 mg 14 days on/7 days off refilled per Dr. Irene Limbo OV note 07/04/20 Escribed to [place], [place] #[id number]; 07/05/20

## 2020-07-08 LAB — MULTIPLE MYELOMA PANEL, SERUM
Albumin SerPl Elph-Mcnc: 3.4 g/dL (ref 2.9–4.4)
Albumin/Glob SerPl: 1.3 (ref 0.7–1.7)
Alpha 1: 0.3 g/dL (ref 0.0–0.4)
Alpha2 Glob SerPl Elph-Mcnc: 0.8 g/dL (ref 0.4–1.0)
B-Globulin SerPl Elph-Mcnc: 0.8 g/dL (ref 0.7–1.3)
Gamma Glob SerPl Elph-Mcnc: 0.8 g/dL (ref 0.4–1.8)
Globulin, Total: 2.7 g/dL (ref 2.2–3.9)
IgA: 24 mg/dL — ABNORMAL LOW (ref 64–422)
IgG (Immunoglobin G), Serum: 780 mg/dL (ref 586–1602)
IgM (Immunoglobulin M), Srm: 37 mg/dL (ref 26–217)
M Protein SerPl Elph-Mcnc: 0.4 g/dL — ABNORMAL HIGH
Total Protein ELP: 6.1 g/dL (ref 6.0–8.5)

## 2020-07-09 ENCOUNTER — Telehealth: Payer: Self-pay | Admitting: Hematology

## 2020-07-09 ENCOUNTER — Other Ambulatory Visit: Payer: Self-pay | Admitting: Hematology

## 2020-07-09 ENCOUNTER — Other Ambulatory Visit: Payer: Self-pay

## 2020-07-09 ENCOUNTER — Inpatient Hospital Stay: Payer: Medicare Other

## 2020-07-09 VITALS — BP 136/74 | HR 99 | Resp 16

## 2020-07-09 DIAGNOSIS — Z7189 Other specified counseling: Secondary | ICD-10-CM

## 2020-07-09 DIAGNOSIS — C9000 Multiple myeloma not having achieved remission: Secondary | ICD-10-CM

## 2020-07-09 LAB — CMP (CANCER CENTER ONLY)
ALT: 10 U/L (ref 0–44)
AST: 10 U/L — ABNORMAL LOW (ref 15–41)
Albumin: 3.6 g/dL (ref 3.5–5.0)
Alkaline Phosphatase: 161 U/L — ABNORMAL HIGH (ref 38–126)
Anion gap: 7 (ref 5–15)
BUN: 14 mg/dL (ref 8–23)
CO2: 27 mmol/L (ref 22–32)
Calcium: 8.9 mg/dL (ref 8.9–10.3)
Chloride: 102 mmol/L (ref 98–111)
Creatinine: 1.05 mg/dL — ABNORMAL HIGH (ref 0.44–1.00)
GFR, Estimated: 56 mL/min — ABNORMAL LOW (ref >60)
Glucose, Bld: 99 mg/dL (ref 70–99)
Potassium: 4.7 mmol/L (ref 3.5–5.1)
Sodium: 136 mmol/L (ref 135–145)
Total Bilirubin: 0.4 mg/dL (ref 0.3–1.2)
Total Protein: 6.3 g/dL — ABNORMAL LOW (ref 6.5–8.1)

## 2020-07-09 LAB — CBC WITH DIFFERENTIAL/PLATELET
Abs Immature Granulocytes: 0.01 10*3/uL (ref 0.00–0.07)
Basophils Absolute: 0.1 10*3/uL (ref 0.0–0.1)
Basophils Relative: 2 %
Eosinophils Absolute: 0.2 10*3/uL (ref 0.0–0.5)
Eosinophils Relative: 7 %
HCT: 38.4 % (ref 36.0–46.0)
Hemoglobin: 12.8 g/dL (ref 12.0–15.0)
Immature Granulocytes: 0 %
Lymphocytes Relative: 22 %
Lymphs Abs: 0.7 10*3/uL (ref 0.7–4.0)
MCH: 30.2 pg (ref 26.0–34.0)
MCHC: 33.3 g/dL (ref 30.0–36.0)
MCV: 90.6 fL (ref 80.0–100.0)
Monocytes Absolute: 0.5 10*3/uL (ref 0.1–1.0)
Monocytes Relative: 17 %
Neutro Abs: 1.6 10*3/uL — ABNORMAL LOW (ref 1.7–7.7)
Neutrophils Relative %: 52 %
Platelets: 267 10*3/uL (ref 150–400)
RBC: 4.24 MIL/uL (ref 3.87–5.11)
RDW: 13.4 % (ref 11.5–15.5)
WBC: 3.1 10*3/uL — ABNORMAL LOW (ref 4.0–10.5)
nRBC: 0 % (ref 0.0–0.2)

## 2020-07-09 MED ORDER — CYANOCOBALAMIN 1000 MCG/ML IJ SOLN
1000.0000 ug | Freq: Once | INTRAMUSCULAR | Status: AC
  Administered 2020-07-09: 1000 ug via SUBCUTANEOUS

## 2020-07-09 MED ORDER — CYANOCOBALAMIN 1000 MCG/ML IJ SOLN
INTRAMUSCULAR | Status: AC
  Filled 2020-07-09: qty 1

## 2020-07-09 MED ORDER — PROCHLORPERAZINE MALEATE 10 MG PO TABS
10.0000 mg | ORAL_TABLET | Freq: Once | ORAL | Status: AC
  Administered 2020-07-09: 10 mg via ORAL

## 2020-07-09 MED ORDER — CYANOCOBALAMIN 1000 MCG/ML IJ SOLN: 1000.0000 ug | Freq: Once | INTRAMUSCULAR | Status: DC

## 2020-07-09 MED ORDER — BORTEZOMIB CHEMO SQ INJECTION 3.5 MG (2.5MG/ML)
1.3000 mg/m2 | Freq: Once | INTRAMUSCULAR | Status: AC
  Administered 2020-07-09: 2.5 mg via SUBCUTANEOUS
  Filled 2020-07-09: qty 1

## 2020-07-09 MED ORDER — PROCHLORPERAZINE MALEATE 10 MG PO TABS
ORAL_TABLET | ORAL | Status: AC
  Filled 2020-07-09: qty 1

## 2020-07-09 NOTE — Telephone Encounter (Signed)
Called patient yesterday and this morning regarding 01/11 appointments, left a voicemail each time.

## 2020-07-11 ENCOUNTER — Other Ambulatory Visit: Payer: Self-pay

## 2020-07-11 ENCOUNTER — Encounter: Payer: Self-pay | Admitting: Urology

## 2020-07-11 ENCOUNTER — Ambulatory Visit
Admission: RE | Admit: 2020-07-11 | Discharge: 2020-07-11 | Disposition: A | Discharge status: Home / Self Care | Payer: Medicare Other | Source: Ambulatory Visit | Attending: Urology | Admitting: Urology

## 2020-07-11 DIAGNOSIS — C9000 Multiple myeloma not having achieved remission: Secondary | ICD-10-CM

## 2020-07-11 NOTE — Progress Notes (Signed)
Radiation Oncology         [phone] ________________________________  Name: Lisa Lester MRN: [id number]  Date: 07/11/2020  DOB: 12/28/1945  Post Treatment Note  CC: [name], NP  [name], NP  Diagnosis:   75 yo woman with painful spinal C1 lesion from multiple myeloma  Interval Since Last Radiation:  2 weeks  06/13/20 - 06/27/20:  The target lesion at C1 was treated to 20Gy in 21factions  Narrative:  I spoke with the patient to conduct her routine scheduled 1 month follow up visit via telephone to spare the patient unnecessary potential exposure in the healthcare setting during the current COVID-19 pandemic.  The patient was notified in advance and gave permission to proceed with this visit format. She tolerated radiation treatment relatively well without any ill acute effects.                              On review of systems, the patient states that she is doing relatively well in general. She has some ageusia since completing radiation and this, along with low grade nausea, has contributed to significant decreased appetite and weight loss recently. She denies abdominal pain, vomiting, diarrhea or constipation. Her neck pain is greatly improved which she is delighted with. She denies any new musculoskeletal joint aches or pains. She is not having any numbness/tingling in the upper extremities and denies chest pain, SOB or dyspnea. She has not had recent fever, chills or night sweats. Overall, she is pleased with her progress following radiation and is tolerating her systemic therapy fairly well. Recent labs show a good response to treatment.  ALLERGIES:  has No Known Allergies.  Meds: Current Outpatient Medications  Medication Sig Dispense Refill  . acyclovir (ZOVIRAX) 400 MG tablet Take 400 mg by mouth 2 (two) times daily.    .Marland KitchenALPRAZolam (XANAX) 0.5 MG tablet Take 1 tablet (0.5 mg total) by mouth 3 (three) times daily as needed for anxiety. 15 tablet 0  .  aspirin EC 81 MG tablet Take 81 mg by mouth daily. Swallow whole.    . Cyanocobalamin (B-12) 1000 MCG SUBL Place 1,000 mcg under the tongue daily. 60 tablet 2  . cyclobenzaprine (FLEXERIL) 5 MG tablet Take 1 tablet (5 mg total) by mouth 3 (three) times daily. 30 tablet 0  . DULoxetine (CYMBALTA) 30 MG capsule Take 1 capsule (30 mg total) by mouth daily. 30 capsule 3  . fentaNYL (DURAGESIC) 25 MCG/HR Place 1 patch onto the skin every 3 (three) days. 10 patch 0  . lenalidomide (REVLIMID) 15 MG capsule TAKE 1 CAPSULE BY MOUTH  DAILY FOR 14 DAYS, THEN 7  DAYS OFF REPEAT EVERY 21  DAYS 14 capsule 0  . lidocaine (LIDODERM) 5 % Place 1 patch onto the skin daily. Remove & Discard patch within 12 hours or as directed by MD on the neck 30 patch 5  . meloxicam (MOBIC) 15 MG tablet Take 15 mg by mouth daily.    .Marland Kitchennystatin (MYCOSTATIN) 100000 UNIT/ML suspension Take 5 mLs (500,000 Units total) by mouth 4 (four) times daily. 200 mL 0  . ondansetron (ZOFRAN) 8 MG tablet Take 8 mg by mouth every 8 (eight) hours as needed for nausea or vomiting.    .Marland KitchenoxyCODONE-acetaminophen (PERCOCET/ROXICET) 5-325 MG tablet Take 1 tablet by mouth every 6 (six) hours as needed for up to 39 doses for moderate pain or severe pain. 30 tablet 0  .  pantoprazole (PROTONIX) 40 MG tablet Take 1 tablet (40 mg total) by mouth daily. 30 tablet 1  . polyethylene glycol (MIRALAX) 17 g packet Take 17 g by mouth daily. (Patient taking differently: Take 17 g by mouth daily as needed for mild constipation.) 30 each 1  . prochlorperazine (COMPAZINE) 10 MG tablet Take 10 mg by mouth every 6 (six) hours as needed for nausea or vomiting.    . senna-docusate (SENNA S) 8.6-50 MG tablet Take 2 tablets by mouth 2 (two) times daily. 60 tablet 2   No current facility-administered medications for this encounter.    Physical Findings:  vitals were not taken for this visit.   /Unable to assess due to telephone follow-up visit format.  Lab Findings: Lab  Results  Component Value Date   WBC 3.1 (L) 07/09/2020   HGB 12.8 07/09/2020   HCT 38.4 07/09/2020   MCV 90.6 07/09/2020   PLT 267 07/09/2020     Radiographic Findings: CT ANGIO NECK W OR WO CONTRAST  Result Date: 06/15/2020 CLINICAL DATA:  Chronic neck pain. Possible C1 fracture. Multiple myeloma. EXAM: CT ANGIOGRAPHY NECK TECHNIQUE: Multidetector CT imaging of the neck was performed using the standard protocol during bolus administration of intravenous contrast. Multiplanar CT image reconstructions and MIPs were obtained to evaluate the vascular anatomy. Carotid stenosis measurements (when applicable) are obtained utilizing NASCET criteria, using the distal internal carotid diameter as the denominator. CONTRAST:  161m OMNIPAQUE IOHEXOL 350 MG/ML SOLN COMPARISON:  Cervical MRI 06/13/2020 FINDINGS: Aortic arch: Mild aortic atherosclerosis. Branching pattern is normal, with the variant of the left vertebral artery arising directly from the arch. No origin stenosis ease. Right carotid system: Common carotid artery widely patent to the bifurcation. Carotid bifurcation is normal. Cervical ICA is normal. Left carotid system: Common carotid artery widely patent to the bifurcation. The bifurcation is normal. Cervical ICA is normal. Vertebral arteries: Right vertebral artery origin widely patent. Left vertebral artery arises from the arch as noted above. Origin widely patent. Both vertebral arteries are widely patent through the cervical region. Tumor involvement on the left at C4 does involve the transverse foramen slightly but does not cause any contour change of the vertebral artery. Tumor involvement of the transverse foramen on the left at C2 is associated with occlusion of the left vertebral artery. There is some reconstituted flow within the left vertebral artery at the foramen magnum. Skeleton: Myeloma involvement throughout the cervical spine as will be described on the cervical MRI. Compression  fracture T3. Other neck: No extra-spinal soft tissue mass or lymphadenopathy. Upper chest: Lung apices are clear. IMPRESSION: 1. Myeloma involvement throughout the cervical spine as will be described on the cervical CT. 2. Tumor involvement of the transverse foramen on the left at C2 is associated with occlusion of the left vertebral artery. There is some reconstituted flow within the left vertebral artery at the foramen magnum. 3. No carotid bifurcation disease. 4. Right vertebral artery widely patent to the basilar. Reconstituted or retrograde flow within the distal left vertebral artery back as far as the foramen magnum. Aortic Atherosclerosis (ICD10-I70.0). Electronically Signed   By: MNelson ChimesM.D.   On: 06/15/2020 16:14   MR BRAIN WO CONTRAST  Result Date: 06/13/2020 CLINICAL DATA:  Mental status change, unknown cause acute psychosis, agitation, has multiple myeloma, unable to lie down flat due to persistent neck pain despite Pain management EXAM: MRI HEAD WITHOUT CONTRAST TECHNIQUE: Multiplanar, multiecho pulse sequences of the brain and surrounding structures were obtained without  intravenous contrast. COMPARISON:  06/04/2020 head CT.  Concurrent MRI cervical spine. FINDINGS: Brain: No acute infarct. No intracranial hemorrhage. No midline shift, ventriculomegaly or extra-axial fluid collection. No mass lesion. Mild cerebral atrophy with ex vacuo dilatation. Minimal chronic microvascular ischemic changes. Vascular: Left V4 segment T2 hyperintense signal may reflect slow flow versus chronic occlusion. Remaining major intracranial flow voids proximally preserved. Left cerebellar developmental venous anomaly. Skull: Scattered subcentimeter T2 hyperintense calvarial lesions (for example 6:18) likely reflect myomatous sequela. Please see concurrent MRI cervical spine for findings below the foramen magnum. Sinuses/Orbits: Normal orbits. Mild ethmoid and left sphenoid sinus mucosal thickening. Trace  bilateral mastoid free fluid. Other: None. IMPRESSION: No acute intracranial process. Minimal chronic microvascular ischemic changes. Multifocal subcentimeter calvarial myomatous lesions. Left V4 segment slow flow versus chronic occlusion/high-grade narrowing. Electronically Signed   By: Primitivo Gauze M.D.   On: 06/13/2020 10:48   MR CERVICAL SPINE WO CONTRAST  Addendum Date: 06/13/2020   ADDENDUM REPORT: 06/13/2020 11:15 ADDENDUM: These results were called by telephone at the time of interpretation on 06/13/2020 at 11:12 am to provider Pokhrel, Corrie Mckusick, MD , who verbally acknowledged these results. Electronically Signed   By: Primitivo Gauze M.D.   On: 06/13/2020 11:15   Result Date: 06/13/2020 CLINICAL DATA:  acute psychosis, agitation, has multiple myeloma, unable to lie down flat due to persistent neck pain despite Pain management. 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 No intravenous contrast was administered. COMPARISON:  Concurrent MRI head. FINDINGS: Alignment: Normal. Vertebrae: Multifocal T1 hypointense/STIR hyperintense osseous lesions. Chronic T3 compression deformity with moderate height loss. Bone marrow edema involving the left lateral C1 mass with associated compression deformity likely reflects subacute pathologic fracture. Bone marrow edema extends to the left transverse foramen with redemonstration of T2 hyperintense left V4/V3 segment signal. Mild reactive perivertebral edema at this level. Remaining vertebral body heights are preserved. Cord: Normal signal and morphology. Posterior Fossa, vertebral arteries: Please see concurrent MRI head for findings above the foramen magnum. Disc levels: Multilevel desiccation and disc space loss. C2-3: Disc osteophyte complex abutting the ventral cord and bilateral facet degenerative spurring. Prominent  ligamentum flavum. Mild spinal canal and right neural foraminal narrowing. Patent left neural foramen. C3-4: Disc osteophyte complex abutting the ventral cord with uncovertebral and facet degenerative spurring. Prominent ligamentum flavum. Mild spinal canal and bilateral neural foraminal narrowing. C4-5: Disc osteophyte complex with uncovertebral and facet hypertrophy. Mild spinal canal and moderate right and severe left neural foraminal narrowing. C5-6: Disc osteophyte complex with uncovertebral and facet hypertrophy. Superimposed right paracentral protrusion abutting the ventral cord. Mild spinal canal and moderate bilateral neural foraminal narrowing. C6-7: Disc osteophyte complex with uncovertebral and facet hypertrophy. Superimposed right foraminal protrusion. Mild spinal canal, moderate right and mild left neural foraminal narrowing. C7-T1: No significant disc bulge. Uncovertebral and facet degenerative spurring. Mild left neural foraminal narrowing. Patent spinal canal and right neural foramen. Paraspinal tissues: Negative. IMPRESSION: Multifocal myomatous lesions with subacute pathologic fracture deformity of the left lateral C1 mass likely involving the left transverse foramen. Left V3/V4 segment T2 hyperintense signal is concerning for high-grade narrowing/occlusion. Further evaluation with CTA neck is recommended to exclude vascular injury. Chronic T3 compression deformity with moderate height loss. Multilevel spondylosis as detailed above. Electronically Signed: By: Primitivo Gauze M.D. On: 06/13/2020 11:08   CT C-SPINE NO CHARGE  Result Date: 06/15/2020 CLINICAL DATA:  Multiple myeloma. Left vertebral artery  occlusion at C2. C1 fracture previously shown by cervical MRI. EXAM: CT CERVICAL SPINE WITHOUT CONTRAST TECHNIQUE: Multidetector CT imaging of the cervical spine was performed without intravenous contrast. Multiplanar CT image reconstructions were also generated. COMPARISON:  CT angiography  same day.  Cervical MRI 2 days ago. FINDINGS: Alignment: Normal Skull base and vertebrae: Lytic lesions throughout the skeleton consistent with diffuse multiple myeloma. Major findings will be described at each level below. Pathologic fracture the left lateral mass of C1. Soft tissues and spinal canal: No soft tissue neck lesion. No evidence of spinal canal compromise. Disc levels: Skull base and foramen magnum: Small lytic lesion at the tip of the clivus. Foramen magnum sufficiently patent. C1-2: Lytic destruction with pathologic fracture of the left lateral mass of C1 and the left side of the anterior arch. Extensive lytic change within the C2 vertebra which could place the patient at risk of pathologic fracture. Tumor involves the transverse foramen on the left where there is occlusion of the left vertebral artery shown by CT angiography. C3: Lytic lesions, most pronounced in the left anterior vertebral body. No canal compromise. C4: Lytic disease most pronounced on the left, with tumor extending into the transverse foramen and surrounding the left vertebral artery. No vertebral artery constriction or occlusion at this level. C5: Small lytic lesions. C6: Small lytic lesions. C7: Small lytic lesions. T1: Large lytic lesion of the anterior vertebral body. No fracture. No encroachment upon the neural structures. T2: Small lytic lesions. T3: Chronic pathologic compression fracture. No encroachment upon the canal or foramina. Upper chest: Lung apices are clear. Other: None IMPRESSION: 1. Lytic lesions throughout the skeleton consistent with diffuse multiple myeloma. 2. Pathologic fractures of the left lateral mass of C1 and the left side of the anterior arch of C1. Extensive lytic change within the C2 vertebra which could place the patient at risk of pathologic fracture of C2. Tumor involves the left transverse foramen at C2 in the left vertebral artery is occluded in this location. Tumor also surrounds the left  vertebral artery in the transverse foramen C4 but there is no narrowing or near occlusion at this level. 3. Multiple lytic lesions throughout the skeleton as outlined in detail above. 4. Chronic pathologic compression fracture at T3. No encroachment upon the canal or foramina. Electronically Signed   By: Nelson Chimes M.D.   On: 06/15/2020 16:22    Impression/Plan: 1. 75 yo woman with painful spinal C1 lesion from multiple myeloma. She appears to have recovered well from the effects of her recent radiotherapy and is pleased with the improvement in her neck pain.  She  is tolerating her systemic therapy well.  At the time of her last visit with Dr. Irene Limbo, it was felt that her disease was responding favorably, so the plan is to continue her current therapy with cycle 3 of VRD on 07/08/20.  We discussed that while we are happy to continue to participate in her care if clinically indicated, at this point, we will plan to see her back on an as-needed basis.  She will continue in routine follow-up under the care and direction of Dr. Irene Limbo for continued management of her systemic disease.  She knows that she is welcome to call anytime with any questions or concerns related to her previous radiotherapy.    Nicholos Johns, PA-C

## 2020-07-11 NOTE — Progress Notes (Signed)
  Radiation Oncology         [phone] ________________________________  Name: Lisa Lester MRN: [id number]  Date: 06/27/2020  DOB: 1946/04/20  End of Treatment Note  Diagnosis:   75 yo woman with painful spinal C1 lesion from multiple myeloma     Indication for treatment:  Palliation       Radiation treatment dates:   06/13/20 - 06/27/20  Site/dose:   The target lesion at C1 was treated to 20 Gy in 10 fractions  Beams/energy:   A 3D field set-up was employed with 6 MV X-rays  Narrative: The patient tolerated radiation treatment relatively well without any ill acute effects.  Plan: The patient has completed radiation treatment. The patient will return to radiation oncology clinic for routine followup in one month. I advised her to call or return sooner if she has any questions or concerns related to her recovery or treatment. ________________________________  Sheral Apley. Tammi Klippel, M.D.

## 2020-07-12 ENCOUNTER — Encounter: Payer: Self-pay | Admitting: Hematology

## 2020-07-12 ENCOUNTER — Other Ambulatory Visit: Payer: Self-pay | Admitting: *Deleted

## 2020-07-12 ENCOUNTER — Inpatient Hospital Stay: Payer: Medicare Other

## 2020-07-12 ENCOUNTER — Other Ambulatory Visit: Payer: Self-pay

## 2020-07-12 VITALS — BP 123/64 | HR 99 | Temp 99.2°F | Resp 18 | Ht 65.0 in | Wt 147.0 lb

## 2020-07-12 DIAGNOSIS — Z7189 Other specified counseling: Secondary | ICD-10-CM

## 2020-07-12 DIAGNOSIS — C9000 Multiple myeloma not having achieved remission: Secondary | ICD-10-CM

## 2020-07-12 MED ORDER — OXYCODONE-ACETAMINOPHEN 5-325 MG PO TABS: 1.0000 | ORAL_TABLET | Freq: Four times a day (QID) | ORAL | 0 refills | Status: DC | PRN

## 2020-07-12 MED ORDER — BORTEZOMIB CHEMO SQ INJECTION 3.5 MG (2.5MG/ML)
1.3000 mg/m2 | Freq: Once | INTRAMUSCULAR | Status: AC
  Administered 2020-07-12: 2.5 mg via SUBCUTANEOUS
  Filled 2020-07-12: qty 1

## 2020-07-12 MED ORDER — PROCHLORPERAZINE MALEATE 10 MG PO TABS
ORAL_TABLET | ORAL | Status: AC
  Filled 2020-07-12: qty 1

## 2020-07-12 MED ORDER — PROCHLORPERAZINE MALEATE 10 MG PO TABS
10.0000 mg | ORAL_TABLET | Freq: Once | ORAL | Status: AC
  Administered 2020-07-12: 10 mg via ORAL

## 2020-07-12 NOTE — Patient Instructions (Signed)
[facility] Discharge Instructions for Patients Receiving Chemotherapy  Today you received the following chemotherapy agents Velcade.  To help prevent nausea and vomiting after your treatment, we encourage you to take your nausea medication as directed.  If you develop nausea and vomiting that is not controlled by your nausea medication, call the clinic.   BELOW ARE SYMPTOMS THAT SHOULD BE REPORTED IMMEDIATELY:  *FEVER GREATER THAN 100.5 F  *CHILLS WITH OR WITHOUT FEVER  NAUSEA AND VOMITING THAT IS NOT CONTROLLED WITH YOUR NAUSEA MEDICATION  *UNUSUAL SHORTNESS OF BREATH  *UNUSUAL BRUISING OR BLEEDING  TENDERNESS IN MOUTH AND THROAT WITH OR WITHOUT PRESENCE OF ULCERS  *URINARY PROBLEMS  *BOWEL PROBLEMS  UNUSUAL RASH Items with * indicate a potential emergency and should be followed up as soon as possible.  Feel free to call the clinic should you have any questions or concerns. The clinic phone number is [phone].  Please show the CHEMO ALERT CARD at check-in to the Emergency Department and triage nurse.   

## 2020-07-12 NOTE — Progress Notes (Signed)
lwbs Rescheduled  RM

## 2020-07-15 ENCOUNTER — Ambulatory Visit: Payer: Medicare Other | Admitting: Hematology

## 2020-07-15 ENCOUNTER — Other Ambulatory Visit: Payer: Medicare Other

## 2020-07-15 ENCOUNTER — Ambulatory Visit: Payer: Medicare Other

## 2020-07-16 ENCOUNTER — Other Ambulatory Visit: Payer: Self-pay | Admitting: Hematology

## 2020-07-17 ENCOUNTER — Other Ambulatory Visit: Payer: Self-pay | Admitting: Hematology

## 2020-07-17 DIAGNOSIS — C9000 Multiple myeloma not having achieved remission: Secondary | ICD-10-CM

## 2020-07-17 MED ORDER — OXYCODONE-ACETAMINOPHEN 5-325 MG PO TABS: 1.0000 | ORAL_TABLET | Freq: Four times a day (QID) | ORAL | 0 refills | Status: DC | PRN

## 2020-07-17 NOTE — Progress Notes (Incomplete)
HEMATOLOGY/ONCOLOGY CONSULTATION NOTE  Date of Service: 07/17/2020  Patient Care Team: Lisa Coss, NP as PCP - General (Adult Health Nurse Practitioner)  CHIEF COMPLAINTS/PURPOSE OF CONSULTATION:  Multiple Myeloma  HISTORY OF PRESENTING ILLNESS:  Lisa Lester is a wonderful 75 y.o. female who has been referred to [place] by Dr. 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 Pt is accompanied today by her nephew. The pt reports that she is doing well overall.   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 To treat this the pt had a Kyphoplasty and radiation therapy. Pt also had a BM Bx, urinalysis, and a bone scan completed at the time. There was no official diagnosis of MGUS or Multiple Myeloma at this time, but they continued to monitor.   A few weeks ago ptt began having significant pain in her chest, which was thought to be a fractured sternum. Pt describes the pain as "flesh being ripped off of her bones". She was also having left-sided neck and back discomfort at this time. Pt was taken to the hospital where her pain was treated and she received a right Iliac Bx, BM Bx, and CT Chest while admitted. They did not complete any whole body imaging at this time.   She had a root canal nearly 1.5 months ago. Pt discontinued taking Thyroid replacement 5-6 years ago. Pt is also no longer taking any HTN medications. She was asked to restart Gabapentin yesterday, which she was given for pain control, but felt ill after taking it. She continues taking Xanax and Hydrocodone every 4 hours. Pt reports some constipation and takes Colace occasionally.   Pt had COVID19 in July of 2020. Her husband also had the virus which caused his passing. Pt has received two doses of the COVID19 vaccine but has not received the booster.  Of note since the patient's last visit, pt has had CT Chest ([id number]) completed on  04/09/2020 with results revealing "1. Chronic posterior surgical fusion of L2 and L4 with bilateral transpedicular screws and bridging rods. Hardware intact appearing. 2. Status post vertebroplasty of L3. There is an underlying lytic lesion at the L3 vertebral body which extends into its left pedicle. These findings correlate with the provided history of a known plasmacytoma. 3. There are numerous lytic skeletal lesions at the chest, abdomen and pelvis, more numerous at the spine and ribs. 4. Multiple lytic lesions are present at the sternum, with interval development of a pathologic fracture at the sternum without significant displacement. 5. Stable mild superior endplate compression deformity at T11. 6. 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 Similar findings were present on prior study from 03/30/12, the degree of biliary ductal dilation has worsened as compared to the prior study. No choledochal stone identified.   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  Most recent lab results (04/11/2020) of CBC w/diff is as follows: all values are WNL except for Lymphs Rel at  18.5. 04/10/2020 CMP is as follows: all values are WNL except for CO2 at 33, Glucose at 120, Calcium at 11.6, Anion gap at 3.  On review of systems, pt reports constipation, chest pain, neck pain, back pain and denies dysuria and any other symptoms.   On PMHx the pt reports  Kyphoplasty, Plasmacytoma, HTN, Thyroid disorder. On Social Hx the pt reports that she is a non-smoker and only drinks alcohol socially.   INTERVAL HISTORY: I connected with Lisa Lester on 07/18/2020 by telephone and verified that I am speaking with the correct person using two identifiers.   I discussed the limitations of evaluation and management by telemedicine. The patient expressed understanding and agreed to proceed.   Other persons participating in the visit and their role in the encounter:                                                             - Reinaldo Raddle, Medical Scribe     Patient's location: Home Provider's location: [place] [facility] [place] is a wonderful 75 y.o. 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 Pt is here for f/u after recent hospitalization. The patient's last visit with [place] was on 07/04/2020. The pt reports that she is doing well overall.  The pt reports ***  Lab results 07/09/2020 of CBC w/diff and CMP is as follows: all values are WNL except for WBC at 3.1K, Neutro Abs at 1.6K, Creatinine t 1.05, Total Protein at 6.3, AST at 10, Alkaline Phosphatase at 161, and GFR est at 56.  On review of systems, pt reports *** and denies *** and any other symptoms.   MEDICAL HISTORY:  Past Medical History:  Diagnosis Date  . Multiple myeloma ([place])   . Pathologic fracture    L3  . Plasmacytoma ([place])     SURGICAL HISTORY: Past Surgical History:  Procedure Laterality Date  . BACK SURGERY    . BREAST ENHANCEMENT SURGERY Bilateral   . LUMBAR FUSION     L2 - L4, hx of pathologic fx of L3  . RADIOLOGY WITH ANESTHESIA N/A 06/13/2020   Procedure: MRI BRAIN WITHOUT; C-SPINE WITHOUT;  Surgeon: Radiologist, Medication, MD;  Location: [place];  Service: Radiology;  Laterality: N/A;    SOCIAL HISTORY: Social History   Socioeconomic History  . Marital status: Widowed    Spouse name: Lisa Lester  . Number of children: 0  .  Years of education: 68  . Highest education level: 12th grade  Occupational History  . Not on file  Tobacco Use  . Smoking status: Never Smoker  . Smokeless tobacco: Never Used  Vaping Use  . Vaping Use: Never used  Substance and Sexual Activity  . Alcohol use: Never  . Drug use: Never  . Sexual activity: Not Currently  Other Topics Concern  . Not on file  Social History Narrative  . Not on file   Social Determinants of Health   Financial Resource Strain: Not on file  Food Insecurity: Not on file  Transportation Needs: Not on file  Physical Activity: Not on file  Stress: Not on file  Social Connections: Not on file  Intimate Partner Violence: Not on file    FAMILY HISTORY: Family History  Problem Relation Age  of Onset  . Cancer - Other Mother   . Cancer - Other Father     ALLERGIES:  has No Known Allergies.  MEDICATIONS:  Current Outpatient Medications  Medication Sig Dispense Refill  . acyclovir (ZOVIRAX) 400 MG tablet Take 400 mg by mouth 2 (two) times daily.    Mar[place] Kitchen ALPRAZolam (XANAX) 0.5 MG tablet Take 1 tablet (0.5 mg total) by mouth 3 (three) times daily as needed for anxiety. 15 tablet 0  . aspirin EC 81 MG tablet Take 81 mg by mouth daily. Swallow whole.    . Cyanocobalamin (B-12) 1000 MCG SUBL Place 1,000 mcg under the tongue daily. 60 tablet 2  . cyclobenzaprine (FLEXERIL) 5 MG tablet Take 1 tablet (5 mg total) by mouth 3 (three) times daily. 30 tablet 0  . DULoxetine (CYMBALTA) 30 MG capsule Take 1 capsule (30 mg total) by mouth daily. 30 capsule 3  . fentaNYL (DURAGESIC) 25 MCG/HR Place 1 patch onto the skin every 3 (three) days. 10 patch 0  . lenalidomide (REVLIMID) 15 MG capsule TAKE 1 CAPSULE BY MOUTH  DAILY FOR 14 DAYS, THEN 7  DAYS OFF REPEAT EVERY 21  DAYS 14 capsule 0  . lidocaine (LIDODERM) 5 % Place 1 patch onto the skin daily. Remove & Discard patch within 12 hours or as directed by MD on the neck 30 patch 5  . meloxicam (MOBIC) 15 MG tablet Take  15 mg by mouth daily.    Marland Kitchen nystatin (MYCOSTATIN) 100000 UNIT/ML suspension Take 5 mLs (500,000 Units total) by mouth 4 (four) times daily. 200 mL 0  . ondansetron (ZOFRAN) 8 MG tablet Take 8 mg by mouth every 8 (eight) hours as needed for nausea or vomiting.    Marland Kitchen oxyCODONE-acetaminophen (PERCOCET/ROXICET) 5-325 MG tablet Take 1 tablet by mouth every 6 (six) hours as needed for moderate pain or severe pain. 60 tablet 0  . pantoprazole (PROTONIX) 40 MG tablet Take 1 tablet (40 mg total) by mouth daily. 30 tablet 1  . polyethylene glycol (MIRALAX) 17 g packet Take 17 g by mouth daily. (Patient taking differently: Take 17 g by mouth daily as needed for mild constipation.) 30 each 1  . prochlorperazine (COMPAZINE) 10 MG tablet Take 10 mg by mouth every 6 (six) hours as needed for nausea or vomiting.    . senna-docusate (SENNA S) 8.6-50 MG tablet Take 2 tablets by mouth 2 (two) times daily. 60 tablet 2   No current facility-administered medications for this visit.    REVIEW OF SYSTEMS:   10 Point review of Systems was done is negative except as noted above.  PHYSICAL EXAMINATION: ECOG PERFORMANCE STATUS: 2 - Symptomatic, <50% confined to bed  . There were no vitals filed for this visit. There were no vitals filed for this visit. .There is no height or weight on file to calculate BMI.  Telehealth visit.  LABORATORY DATA:  I have reviewed the data as listed  . CBC Latest Ref Rng & Units 07/09/2020 07/04/2020 06/24/2020  WBC 4.0 - 10.5 K/uL 3.1(L) 3.6(L) 4.1  Hemoglobin 12.0 - 15.0 g/dL 12.8 12.4 12.9  Hematocrit 36.0 - 46.0 % 38.4 38.4 39.5  Platelets 150 - 400 K/uL 267 134(L) 234    . CMP Latest Ref Rng & Units 07/09/2020 07/04/2020 06/24/2020  Glucose 70 - 99 mg/dL 99 91 96  BUN 8 - 23 mg/dL '14 10 14  ' Creatinine 0.44 - 1.00 mg/dL 1.05(H) 0.88 0.96  Sodium 135 - 145 mmol/L 136 134(L) 136  Potassium 3.5 - 5.1 mmol/L 4.7 4.2 4.2  Chloride 98 - 111 mmol/L 102 101 100  CO2 22 - 32 mmol/L  '27 23 29  ' Calcium 8.9 - 10.3 mg/dL 8.9 8.4(L) 8.1(L)  Total Protein 6.5 - 8.1 g/dL 6.3(L) 6.5 6.7  Total Bilirubin 0.3 - 1.2 mg/dL 0.4 0.6 0.4  Alkaline Phos 38 - 126 U/L 161(H) 136(H) 117  AST 15 - 41 U/L 10(L) 11(L) 16  ALT 0 - 44 U/L '10 8 15     ' RADIOGRAPHIC STUDIES: I have personally reviewed the radiological images as listed and agreed with the findings in the report. No results found.  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  Previous h/o plasmacytomas 05/15/2020 M Protein at 0.7 g/dL 05/24/2020 PET/CT ([id number]) revealed "1. There are multiple hypermetabolic lytic osseous lesions consistent with multiple myeloma. The most prominent lesion is within the left lateral mass of C1 and this likely involves the foramen transversarium. Probable T3 and sternal pathologic fractures."  2) C1 myeloma lesion   PLAN: *** -Discussed pt labwork 07/09/2020;     -The pt has no prohibitive toxicities from continuing C3D1 VRD in 4 days.  -Advised pt that standard induction treatment is 4-6 cycles, but could be more or less depending on response.  -Will add [place] Vitamin B12 on D1 and D8 of each treatment. -Advised pt that fatigue is caused by myeloma and treatment.  -Recommended that the pt continue to eat well, drink at least 48-64 oz of water each day, and walk 20-30 minutes each day.  -Advised pt that steroids can cause confusion in susceptible populations.  -Advised pt that she can get 4% Lidocaine patches OTC.  -Recommend salt/baking soda mouth rinses  -Recommend pt drink buttermilk or eat more yogurt -Recommend pt begin 1000 mcg Vitamin B12 & one B-complex daily  -Continue daily baby ASA and Acyclovir BID -Continue Zometa every 4 weeks -Rx Vitamin B12, antifungal mouthwash -Will see back in 2 weeks via phone    FOLLOW UP: ***    The total time spent in the appointment was ***  minutes and more than 50% was on counseling and direct patient cares.  All of the patient's questions were answered with apparent satisfaction. The patient knows to call the clinic with any problems, questions or concerns.    Sullivan Lone MD MS AAHIVMS [facility] Hematology/Oncology Physician [facility]  (Office):       [phone] (Work cell):  [phone] (Fax):           [phone]  07/17/2020 2:42 PM   I have reviewed the above documentation for accuracy and completeness, and I agree with the above.

## 2020-07-18 ENCOUNTER — Inpatient Hospital Stay: Payer: Medicare Other

## 2020-07-18 ENCOUNTER — Other Ambulatory Visit: Payer: Self-pay

## 2020-07-18 ENCOUNTER — Ambulatory Visit: Payer: Medicare Other | Admitting: Hematology

## 2020-07-18 VITALS — BP 138/68 | HR 100 | Temp 98.2°F | Resp 16

## 2020-07-18 DIAGNOSIS — Z7189 Other specified counseling: Secondary | ICD-10-CM

## 2020-07-18 DIAGNOSIS — C9000 Multiple myeloma not having achieved remission: Secondary | ICD-10-CM

## 2020-07-18 LAB — CBC WITH DIFFERENTIAL/PLATELET
Abs Immature Granulocytes: 0.04 10*3/uL (ref 0.00–0.07)
Basophils Absolute: 0.1 10*3/uL (ref 0.0–0.1)
Basophils Relative: 2 %
Eosinophils Absolute: 0.2 10*3/uL (ref 0.0–0.5)
Eosinophils Relative: 8 %
HCT: 38.8 % (ref 36.0–46.0)
Hemoglobin: 12.5 g/dL (ref 12.0–15.0)
Immature Granulocytes: 2 %
Lymphocytes Relative: 26 %
Lymphs Abs: 0.7 10*3/uL (ref 0.7–4.0)
MCH: 30 pg (ref 26.0–34.0)
MCHC: 32.2 g/dL (ref 30.0–36.0)
MCV: 93 fL (ref 80.0–100.0)
Monocytes Absolute: 0.2 10*3/uL (ref 0.1–1.0)
Monocytes Relative: 8 %
Neutro Abs: 1.4 10*3/uL — ABNORMAL LOW (ref 1.7–7.7)
Neutrophils Relative %: 54 %
Platelets: 183 10*3/uL (ref 150–400)
RBC: 4.17 MIL/uL (ref 3.87–5.11)
RDW: 14.6 % (ref 11.5–15.5)
WBC: 2.6 10*3/uL — ABNORMAL LOW (ref 4.0–10.5)
nRBC: 0 % (ref 0.0–0.2)

## 2020-07-18 LAB — CMP (CANCER CENTER ONLY)
ALT: 12 U/L (ref 0–44)
AST: 12 U/L — ABNORMAL LOW (ref 15–41)
Albumin: 3.7 g/dL (ref 3.5–5.0)
Alkaline Phosphatase: 160 U/L — ABNORMAL HIGH (ref 38–126)
Anion gap: 9 (ref 5–15)
BUN: 10 mg/dL (ref 8–23)
CO2: 25 mmol/L (ref 22–32)
Calcium: 8.4 mg/dL — ABNORMAL LOW (ref 8.9–10.3)
Chloride: 104 mmol/L (ref 98–111)
Creatinine: 0.89 mg/dL (ref 0.44–1.00)
GFR, Estimated: >60 mL/min (ref >60)
Glucose, Bld: 107 mg/dL — ABNORMAL HIGH (ref 70–99)
Potassium: 4 mmol/L (ref 3.5–5.1)
Sodium: 138 mmol/L (ref 135–145)
Total Bilirubin: 0.4 mg/dL (ref 0.3–1.2)
Total Protein: 6.2 g/dL — ABNORMAL LOW (ref 6.5–8.1)

## 2020-07-18 MED ORDER — PROCHLORPERAZINE MALEATE 10 MG PO TABS
ORAL_TABLET | ORAL | Status: AC
  Filled 2020-07-18: qty 1

## 2020-07-18 MED ORDER — PROCHLORPERAZINE MALEATE 10 MG PO TABS
10.0000 mg | ORAL_TABLET | Freq: Once | ORAL | Status: AC
  Administered 2020-07-18: 10 mg via ORAL

## 2020-07-18 MED ORDER — BORTEZOMIB CHEMO SQ INJECTION 3.5 MG (2.5MG/ML)
1.3000 mg/m2 | Freq: Once | INTRAMUSCULAR | Status: AC
  Administered 2020-07-18: 2.5 mg via SUBCUTANEOUS
  Filled 2020-07-18: qty 1

## 2020-07-18 NOTE — Progress Notes (Signed)
Per Dr. Irene Limbo, ok to treat with low ANC 1.4.

## 2020-07-18 NOTE — Patient Instructions (Signed)
[facility] Discharge Instructions for Patients Receiving Chemotherapy  Today you received the following chemotherapy agents: [email or web address].  To help prevent nausea and vomiting after your treatment, we encourage you to take your nausea medication as directed.   If you develop nausea and vomiting that is not controlled by your nausea medication, call the clinic.   BELOW ARE SYMPTOMS THAT SHOULD BE REPORTED IMMEDIATELY:  *FEVER GREATER THAN 100.5 F  *CHILLS WITH OR WITHOUT FEVER  NAUSEA AND VOMITING THAT IS NOT CONTROLLED WITH YOUR NAUSEA MEDICATION  *UNUSUAL SHORTNESS OF BREATH  *UNUSUAL BRUISING OR BLEEDING  TENDERNESS IN MOUTH AND THROAT WITH OR WITHOUT PRESENCE OF ULCERS  *URINARY PROBLEMS  *BOWEL PROBLEMS  UNUSUAL RASH Items with * indicate a potential emergency and should be followed up as soon as possible.  Feel free to call the clinic should you have any questions or concerns. The clinic phone number is [phone].  Please show the CHEMO ALERT CARD at check-in to the Emergency Department and triage nurse.   

## 2020-07-22 NOTE — Progress Notes (Signed)
HEMATOLOGY/ONCOLOGY CONSULTATION NOTE  Date of Service: 07/22/2020  Patient Care Team: [name], NP as PCP - General (Adult Health Nurse Practitioner)  CHIEF COMPLAINTS/PURPOSE OF CONSULTATION:  Multiple Myeloma  HISTORY OF PRESENTING ILLNESS:  Lisa Lester is a wonderful 75 y.o. female who has been referred to [place] by Dr. 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 Pt is accompanied today by her nephew. The pt reports that she is doing well overall.   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 To treat this the pt had a Kyphoplasty and radiation therapy. Pt also had a BM Bx, urinalysis, and a bone scan completed at the time. There was no official diagnosis of MGUS or Multiple Myeloma at this time, but they continued to monitor.   A few weeks ago ptt began having significant pain in her chest, which was thought to be a fractured sternum. Pt describes the pain as "flesh being ripped off of her bones". She was also having left-sided neck and back discomfort at this time. Pt was taken to the hospital where her pain was treated and she received a right Iliac Bx, BM Bx, and CT Chest while admitted. They did not complete any whole body imaging at this time.   She had a root canal nearly 1.5 months ago. Pt discontinued taking Thyroid replacement 5-6 years ago. Pt is also no longer taking any HTN medications. She was asked to restart Gabapentin yesterday, which she was given for pain control, but felt ill after taking it. She continues taking Xanax and Hydrocodone every 4 hours. Pt reports some constipation and takes Colace occasionally.   Pt had COVID19 in July of 2020. Her husband also had the virus which caused his passing. Pt has received two doses of the COVID19 vaccine but has not received the booster.  Of note since the patient's last visit, pt has had CT Chest ([id number]) completed on  04/09/2020 with results revealing "1. Chronic posterior surgical fusion of L2 and L4 with bilateral transpedicular screws and bridging rods. Hardware intact appearing. 2. Status post vertebroplasty of L3. There is an underlying lytic lesion at the L3 vertebral body which extends into its left pedicle. These findings correlate with the provided history of a known plasmacytoma. 3. There are numerous lytic skeletal lesions at the chest, abdomen and pelvis, more numerous at the spine and ribs. 4. Multiple lytic lesions are present at the sternum, with interval development of a pathologic fracture at the sternum without significant displacement. 5. Stable mild superior endplate compression deformity at T11. 6. 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 Similar findings were present on prior study from 03/30/12, the degree of biliary ductal dilation has worsened as compared to the prior study. No choledochal stone identified.   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  Most recent lab results (04/11/2020) of CBC w/diff is as follows: all values are WNL except for Lymphs Rel at  18.5. 04/10/2020 CMP is as follows: all values are WNL except for CO2 at 33, Glucose at 120, Calcium at 11.6, Anion gap at 3.  On review of systems, pt reports constipation, chest pain, neck pain, back pain and denies dysuria and any other symptoms.   On PMHx the pt reports  Kyphoplasty, Plasmacytoma, HTN, Thyroid disorder. On Social Hx the pt reports that she is a non-smoker and only drinks alcohol socially.   INTERVAL HISTORY: I connected with Lelon Perla  on 07/23/2020 by telephone and verified that I am speaking with the correct person using two identifiers.   I discussed the limitations of evaluation and management by telemedicine. The patient expressed understanding and agreed to proceed.   Other persons participating in the visit and their role in the encounter:                                                             - Reinaldo Raddle, Medical Scribe                - Nathanial Rancher, MD     Patient's location: Home Provider's location: [facility] [place] is a wonderful 75 y.o. female who is here for evaluation and management of Multiple Myeloma.She is currently on C3D15. Pt is here for f/u after recent hospitalization. The patient's last visit with [place] was on 07/04/2020. The pt reports that she is doing well overall. She is on the phone today for a toxicity check for C3.   The pt reports no new symptoms or concerns. She is tolerating treatment well. The pt's neck is still bothersome and is not hurting as much as the last visit. She notes a 60% improvement in this.   The pt notes that she has been eating better as her taste buds have been slowly coming back. Her energy levels are still not baseline, unchanged since the start of treatment.The pt is still able to complete all of her needed activities throughout the day.    The pt notes that she missed the treatment last week due to inclement weather, as this did not get rescheduled. Her next appointment is on 07/29/2020 and 08/01/2020. She just missed one of the four days of treatment (D8 treatment missed).   The pt notes that her thrush is improved due to the antifungal mouthwash. Her taste buds are still not completely baseline, but improving.  Lab results 07/18/2020 of CBC w/diff and CMP is  as follows: all values are WNL except for Glucose of 107, Calcium of 8.4, Total Protein of 6.2, AST of 12, Alkaline Phosphatase at 160, WBC at 2.6K, Neutro Abs at 1.4K.    On review of systems, pt denies new bone pains, fevers, chills and any other symptoms.  MEDICAL HISTORY:  Past Medical History:  Diagnosis Date  . Multiple myeloma ([place])   . Pathologic fracture    L3  . Plasmacytoma ([place])     SURGICAL HISTORY: Past Surgical History:  Procedure Laterality Date  . BACK SURGERY    . BREAST ENHANCEMENT SURGERY Bilateral   . LUMBAR FUSION     L2 - L4, hx of pathologic fx of L3  . RADIOLOGY WITH ANESTHESIA N/A 06/13/2020   Procedure: MRI BRAIN WITHOUT; C-SPINE WITHOUT;  Surgeon:  Radiologist, Medication, MD;  Location: [facility];  Service: Radiology;  Laterality: N/A;    SOCIAL HISTORY: Social History   Socioeconomic History  . Marital status: Widowed    Spouse name: russell Scarpelli  . Number of children: 0  . Years of education: 77  . Highest education level: 12th grade  Occupational History  . Not on file  Tobacco Use  . Smoking status: Never Smoker  . Smokeless tobacco: Never Used  Vaping Use  . Vaping Use: Never used  Substance and Sexual Activity  . Alcohol use: Never  . Drug use: Never  . Sexual activity: Not Currently  Other Topics Concern  . Not on file  Social History Narrative  . Not on file   Social Determinants of Health   Financial Resource Strain: Not on file  Food Insecurity: Not on file  Transportation Needs: Not on file  Physical Activity: Not on file  Stress: Not on file  Social Connections: Not on file  Intimate Partner Violence: Not on file    FAMILY HISTORY: Family History  Problem Relation Age of Onset  . Cancer - Other Mother   . Cancer - Other Father     ALLERGIES:  has No Known Allergies.  MEDICATIONS:  Current Outpatient Medications  Medication Sig Dispense Refill  . acyclovir (ZOVIRAX) 400 MG tablet Take 400 mg by mouth 2 (two)  times daily.    Marland Kitchen ALPRAZolam (XANAX) 0.5 MG tablet Take 1 tablet (0.5 mg total) by mouth 3 (three) times daily as needed for anxiety. 15 tablet 0  . aspirin EC 81 MG tablet Take 81 mg by mouth daily. Swallow whole.    . Cyanocobalamin (B-12) 1000 MCG SUBL Place 1,000 mcg under the tongue daily. 60 tablet 2  . cyclobenzaprine (FLEXERIL) 5 MG tablet Take 1 tablet (5 mg total) by mouth 3 (three) times daily. 30 tablet 0  . DULoxetine (CYMBALTA) 30 MG capsule Take 1 capsule (30 mg total) by mouth daily. 30 capsule 3  . fentaNYL (DURAGESIC) 25 MCG/HR Place 1 patch onto the skin every 3 (three) days. 10 patch 0  . lenalidomide (REVLIMID) 15 MG capsule TAKE 1 CAPSULE BY MOUTH  DAILY FOR 14 DAYS, THEN 7  DAYS OFF REPEAT EVERY 21  DAYS 14 capsule 0  . lidocaine (LIDODERM) 5 % Place 1 patch onto the skin daily. Remove & Discard patch within 12 hours or as directed by MD on the neck 30 patch 5  . meloxicam (MOBIC) 15 MG tablet Take 15 mg by mouth daily.    Marland Kitchen nystatin (MYCOSTATIN) 100000 UNIT/ML suspension Take 5 mLs (500,000 Units total) by mouth 4 (four) times daily. 200 mL 0  . ondansetron (ZOFRAN) 8 MG tablet Take 8 mg by mouth every 8 (eight) hours as needed for nausea or vomiting.    Marland Kitchen oxyCODONE-acetaminophen (PERCOCET/ROXICET) 5-325 MG tablet Take 1 tablet by mouth every 6 (six) hours as needed for moderate pain or severe pain. 60 tablet 0  . pantoprazole (PROTONIX) 40 MG tablet Take 1 tablet (40 mg total) by mouth daily. 30 tablet 1  . polyethylene glycol (MIRALAX) 17 g packet Take 17 g by mouth daily. (Patient taking differently: Take 17 g by mouth daily as needed for mild constipation.) 30 each 1  . prochlorperazine (COMPAZINE) 10 MG tablet Take 10 mg by mouth every 6 (six) hours as needed for nausea or vomiting.    . senna-docusate (SENNA S) 8.6-50 MG tablet Take 2 tablets by mouth 2 (two)  times daily. 60 tablet 2   No current facility-administered medications for this visit.    REVIEW OF  SYSTEMS:   10 Point review of Systems was done is negative except as noted above.   PHYSICAL EXAMINATION: ECOG PERFORMANCE STATUS: 2 - Symptomatic, <50% confined to bed  . There were no vitals filed for this visit. There were no vitals filed for this visit. .There is no height or weight on file to calculate BMI.  Telehealth Visit.    LABORATORY DATA:  I have reviewed the data as listed  . CBC Latest Ref Rng & Units 07/18/2020 07/09/2020 07/04/2020  WBC 4.0 - 10.5 K/uL 2.6(L) 3.1(L) 3.6(L)  Hemoglobin 12.0 - 15.0 g/dL 12.5 12.8 12.4  Hematocrit 36.0 - 46.0 % 38.8 38.4 38.4  Platelets 150 - 400 K/uL 183 267 134(L)    . CMP Latest Ref Rng & Units 07/18/2020 07/09/2020 07/04/2020  Glucose 70 - 99 mg/dL 107(H) 99 91  BUN 8 - 23 mg/dL _0 Creatinine 0.44 - 1.00 mg/dL 0.89 1.05(H) 0.88  Sodium 135 - 145 mmol/L 138 136 134(L)  Potassium 3.5 - 5.1 mmol/L 4.0 4.7 4.2  Chloride 98 - 111 mmol/L 104 102 101  CO2 22 - 32 mmol/L _1 Calcium 8.9 - 10.3 mg/dL 8.4(L) 8.9 8.4(L)  Total Protein 6.5 - 8.1 g/dL 6.2(L) 6.3(L) 6.5  Total Bilirubin 0.3 - 1.2 mg/dL 0.4 0.4 0.6  Alkaline Phos 38 - 126 U/L 160(H) 161(H) 136(H)  AST 15 - 41 U/L 12(L) 10(L) 11(L)  ALT 0 - 44 U/L _2 RADIOGRAPHIC STUDIES: I have personally reviewed the radiological images as listed and agreed with the findings in the report. No results found.  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  Previous h/o plasmacytomas 05/15/2020 M Protein at 0.7 g/dL 05/24/2020 PET/CT ([id number]) revealed "1. There are multiple hypermetabolic lytic osseous lesions consistent with multiple myeloma. The most prominent lesion is within the left lateral mass of C1 and this likely involves the foramen transversarium. Probable T3 and sternal pathologic fractures."  2) C1 myeloma lesion   PLAN: -Discussed pt labwork from  07/08/2020; mild neutropenia, no anemia -Discussed pt's last m-protein from 07/04/2020-- down to 0.4. Light chains down significantly and nearly normalized to 20 at this time. Ratios down from 105 to 1.85.  -The pt has no prohibitive toxicities from continuing C3D15 VRD. -Due to pt's response, we will not increase the Revlimid to 67m. Continue at 145m -Will continue [place] Vitamin B12 on D1 and D8 of each treatment. -Recommended that the pt continue to eat well, drink at least 48-64 oz of water each day, and walk 20-30 minutes each day.  -Continue 1000 mcg Vitamin B12 & one B-complex daily. -Continue daily baby ASA and Acyclovir BID. -Continue Zometa every 4 weeks. -Will see back with C4D1.     FOLLOW UP: PLz schedule C4 and C5 of VRD as per orders (day 1,4,8,and 11 in each cycle) Labs on D1 and D8 of each cycle of treatment RTC with Dr KaIrene Limbon C4D1  COntinue Zometa q4 weeks x 6 B12 Moore injections D1 and D8 of each cycle of treatment.    The total time spent in the appointment was 20 minutes and more than 50% was on counseling and direct patient cares.  All of the patient's questions were answered with  apparent satisfaction. The patient knows to call the clinic with any problems, questions or concerns.    Sullivan Lone MD [place] [facility] Hematology/Oncology Physician [facility]  (Office):       [phone] (Work cell):  [phone] (Fax):           [phone]  07/22/2020 5:50 PM   I, Reinaldo Raddle, am acting as scribe for Dr. Sullivan Lone, MD.    .I have reviewed the above documentation for accuracy and completeness, and I agree with the above. Brunetta Genera MD

## 2020-07-23 ENCOUNTER — Other Ambulatory Visit: Payer: Self-pay | Admitting: Hematology

## 2020-07-23 ENCOUNTER — Inpatient Hospital Stay (HOSPITAL_BASED_OUTPATIENT_CLINIC_OR_DEPARTMENT_OTHER): Payer: Medicare Other | Admitting: Hematology

## 2020-07-23 DIAGNOSIS — C9000 Multiple myeloma not having achieved remission: Secondary | ICD-10-CM

## 2020-07-25 ENCOUNTER — Other Ambulatory Visit: Payer: Self-pay | Admitting: Registered Nurse

## 2020-07-25 DIAGNOSIS — R52 Pain, unspecified: Secondary | ICD-10-CM

## 2020-07-25 NOTE — Telephone Encounter (Signed)
Requested medication (s) are due for refill today: yes  Requested medication (s) are on the active medication list: yes  Last refill:  07/01/20 #30  Future visit scheduled: no  Notes to clinic:  Please review for refill. Refill not delegated per protocol    Requested Prescriptions  Pending Prescriptions Disp Refills   cyclobenzaprine (FLEXERIL) 5 MG tablet [Pharmacy Med Name: CYCLOBENZAPRINE 5 MG TABLET] 30 tablet 0    Sig: TAKE 1 TABLET BY MOUTH THREE TIMES A DAY      Not Delegated - Analgesics:  Muscle Relaxants Failed - 07/25/2020 11:53 AM      Failed - This refill cannot be delegated      Passed - Valid encounter within last 6 months    Recent Outpatient Visits           3 weeks ago Severe pain   [facility] [place], NP   2 months ago Multiple myeloma not having achieved remission [facility])   [facility] [place], NP   3 months ago    [facility], Delfino Lovett, NP

## 2020-07-26 ENCOUNTER — Other Ambulatory Visit: Payer: Self-pay | Admitting: Registered Nurse

## 2020-07-26 DIAGNOSIS — C9000 Multiple myeloma not having achieved remission: Secondary | ICD-10-CM

## 2020-07-26 DIAGNOSIS — R52 Pain, unspecified: Secondary | ICD-10-CM

## 2020-07-26 NOTE — Telephone Encounter (Signed)
Requested medication (s) are due for refill today: Amount not specified  Requested medication (s) are on the active medication list: yes  Last refill: 06/05/20  Future visit scheduled no  Notes to clinic: Historical Provider  Requested Prescriptions  Pending Prescriptions Disp Refills   meloxicam (MOBIC) 15 MG tablet [Pharmacy Med Name: MELOXICAM 15 MG TABLET] 90 tablet     Sig: TAKE 1 TABLET BY MOUTH EVERY DAY      Analgesics:  COX2 Inhibitors Passed - 07/26/2020  1:31 AM      Passed - HGB in normal range and within 360 days    Hemoglobin  Date Value Ref Range Status  07/18/2020 12.5 12.0 - 15.0 g/dL Final  06/03/2020 12.7 12.0 - 15.0 g/dL Final          Passed - Cr in normal range and within 360 days    Creatinine  Date Value Ref Range Status  07/18/2020 0.89 0.44 - 1.00 mg/dL Final          Passed - Patient is not pregnant      Passed - Valid encounter within last 12 months    Recent Outpatient Visits           3 weeks ago Severe pain   [facility], [place], NP   2 months ago Multiple myeloma not having achieved remission [facility])   [facility], Delfino Lovett, NP   3 months ago    [facility], Delfino Lovett, NP

## 2020-07-28 ENCOUNTER — Other Ambulatory Visit: Payer: Self-pay | Admitting: Hematology

## 2020-07-28 DIAGNOSIS — C9000 Multiple myeloma not having achieved remission: Secondary | ICD-10-CM

## 2020-07-28 NOTE — Progress Notes (Signed)
HEMATOLOGY/ONCOLOGY CONSULTATION NOTE  Date of Service: 07/28/2020  Patient Care Team: [name], NP as PCP - General (Adult Health Nurse Practitioner)  CHIEF COMPLAINTS/PURPOSE OF CONSULTATION:  Multiple Myeloma  HISTORY OF PRESENTING ILLNESS:  Lisa Lester is a wonderful 75 y.o. female who has been referred to [place] by Dr. 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 Pt is accompanied today by her nephew. The pt reports that she is doing well overall.   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 To treat this the pt had a Kyphoplasty and radiation therapy. Pt also had a BM Bx, urinalysis, and a bone scan completed at the time. There was no official diagnosis of MGUS or Multiple Myeloma at this time, but they continued to monitor.   A few weeks ago ptt began having significant pain in her chest, which was thought to be a fractured sternum. Pt describes the pain as "flesh being ripped off of her bones". She was also having left-sided neck and back discomfort at this time. Pt was taken to the hospital where her pain was treated and she received a right Iliac Bx, BM Bx, and CT Chest while admitted. They did not complete any whole body imaging at this time.   She had a root canal nearly 1.5 months ago. Pt discontinued taking Thyroid replacement 5-6 years ago. Pt is also no longer taking any HTN medications. She was asked to restart Gabapentin yesterday, which she was given for pain control, but felt ill after taking it. She continues taking Xanax and Hydrocodone every 4 hours. Pt reports some constipation and takes Colace occasionally.   Pt had COVID19 in July of 2020. Her husband also had the virus which caused his passing. Pt has received two doses of the COVID19 vaccine but has not received the booster.  Of note since the patient's last visit, pt has had CT Chest ([id number]) completed on  04/09/2020 with results revealing "1. Chronic posterior surgical fusion of L2 and L4 with bilateral transpedicular screws and bridging rods. Hardware intact appearing. 2. Status post vertebroplasty of L3. There is an underlying lytic lesion at the L3 vertebral body which extends into its left pedicle. These findings correlate with the provided history of a known plasmacytoma. 3. There are numerous lytic skeletal lesions at the chest, abdomen and pelvis, more numerous at the spine and ribs. 4. Multiple lytic lesions are present at the sternum, with interval development of a pathologic fracture at the sternum without significant displacement. 5. Stable mild superior endplate compression deformity at T11. 6. 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 Similar findings were present on prior study from 03/30/12, the degree of biliary ductal dilation has worsened as compared to the prior study. No choledochal stone identified.   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  Most recent lab results (04/11/2020) of CBC w/diff is as follows: all values are WNL except for Lymphs Rel at  18.5. 04/10/2020 CMP is as follows: all values are WNL except for CO2 at 33, Glucose at 120, Calcium at 11.6, Anion gap at 3.  On review of systems, pt reports constipation, chest pain, neck pain, back pain and denies dysuria and any other symptoms.   On PMHx the pt reports  Kyphoplasty, Plasmacytoma, HTN, Thyroid disorder. On Social Hx the pt reports that she is a non-smoker and only drinks alcohol socially.   INTERVAL HISTORY:  DELPHINE Lester is a wonderful 75 y.o. 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 Pt is here for f/u after recent hospitalization. The patient's last visit with [place] was on 07/04/2020. The pt reports that she is doing well overall. She is here for C4D1 VRD chemotherapy.   The pt reports that her neck pain has been improving, at around a level 5-6 of 10. She is needing less pain pills some days, but other days she needs her baseline amount of pain medication when starting treatment. Her neck pain is exacerbated by sitting for periods of time, and alleviated by laying with her legs elevated. While sitting, she can look straight ahead, but notes there is discomfort when looking up and to the side. She has been experiencing nasal drip that leads to heartburn and nausea.   The pt notes that her appetite is still decreased. She notes this is due to living in a different environment, no taste buds, and not being able to go to store herself. She notes she is a very picky eater.  Lab results today 07/29/2020 of CBC w/diff and CMP is as follows: all values are WNL except for WBC at 3.0K, Lymph Abs at 0.6K. Chemistries in progress. 07/29/2020 Kappa Lambda light chains in progress. 07/29/2020 Multiple Myeloma Panel in progress.  On review of systems, pt reports neck pain, fatigue, decreased appetite, nausea and denies changes in bowel movement, tingling/numbness in hands/feet, fevers, chills, night sweats, abdominal pain, diarrhea, vomiting and any other symptoms.  MEDICAL HISTORY:  Past Medical History:  Diagnosis Date  . Multiple myeloma ([place])   . Pathologic fracture    L3  . Plasmacytoma ([place])     SURGICAL HISTORY: Past Surgical History:  Procedure Laterality Date  . BACK SURGERY    . BREAST ENHANCEMENT SURGERY  Bilateral   . LUMBAR FUSION     L2 - L4, hx of pathologic fx of L3  . RADIOLOGY WITH ANESTHESIA N/A 06/13/2020   Procedure: MRI BRAIN WITHOUT; C-SPINE WITHOUT;  Surgeon: Radiologist, Medication, MD;  Location: [facility];  Service: Radiology;  Laterality: N/A;    SOCIAL HISTORY: Social History   Socioeconomic History  . Marital status: Widowed    Spouse name: russell South  . Number of children: 0  . Years of education: 23  . Highest education level: 12th grade  Occupational History  . Not on file  Tobacco Use  . Smoking status: Never Smoker  . Smokeless tobacco: Never Used  Vaping Use  . Vaping Use: Never used  Substance and Sexual Activity  . Alcohol use: Never  . Drug use: Never  . Sexual activity: Not Currently  Other Topics Concern  . Not on file  Social History Narrative  . Not on file   Social Determinants of Health   Financial Resource Strain: Not on file  Food Insecurity: Not on file  Transportation Needs: Not on file  Physical Activity: Not on file  Stress: Not on file  Social Connections: Not on file  Intimate Partner Violence: Not on file    FAMILY HISTORY: Family History  Problem Relation Age of Onset  . Cancer - Other Mother   . Cancer - Other Father     ALLERGIES:  has No Known Allergies.  MEDICATIONS:  Current Outpatient Medications  Medication Sig Dispense Refill  . acyclovir (ZOVIRAX) 400 MG tablet Take 400 mg by mouth 2 (two) times daily.    Marland Kitchen ALPRAZolam (XANAX) 0.5 MG tablet Take 1 tablet (0.5 mg total) by mouth 3 (three) times daily as needed for anxiety. 15 tablet 0  . aspirin EC 81 MG tablet Take 81 mg by mouth daily. Swallow whole.    . Cyanocobalamin (B-12) 1000 MCG SUBL Place 1,000 mcg under the tongue daily. 60 tablet 2  . cyclobenzaprine (FLEXERIL) 5 MG tablet Take 1 tablet (5 mg total) by mouth 3 (three) times daily. 30 tablet 0  . DULoxetine (CYMBALTA) 30 MG capsule TAKE 1 CAPSULE BY MOUTH EVERY DAY 90 capsule 2  . fentaNYL  (DURAGESIC) 25 MCG/HR Place 1 patch onto the skin every 3 (three) days. 10 patch 0  . lenalidomide (REVLIMID) 15 MG capsule TAKE 1 CAPSULE BY MOUTH  DAILY FOR 14 DAYS, THEN 7  DAYS OFF REPEAT EVERY 21  DAYS 14 capsule 0  . lidocaine (LIDODERM) 5 % Place 1 patch onto the skin daily. Remove & Discard patch within 12 hours or as directed by MD on the neck 30 patch 5  . meloxicam (MOBIC) 15 MG tablet Take 15 mg by mouth daily.    Marland Kitchen nystatin (MYCOSTATIN) 100000 UNIT/ML suspension Take 5 mLs (500,000 Units total) by mouth 4 (four) times daily. 200 mL 0  . ondansetron (ZOFRAN) 8 MG tablet Take 8 mg by mouth every 8 (eight) hours as needed for nausea or vomiting.    Marland Kitchen oxyCODONE-acetaminophen (PERCOCET/ROXICET) 5-325 MG tablet Take 1 tablet by mouth every 6 (six) hours as needed for moderate pain or severe pain. 60 tablet 0  . pantoprazole (PROTONIX) 40 MG tablet Take 1 tablet (40 mg total) by mouth daily. 30 tablet 1  . polyethylene glycol (MIRALAX) 17 g packet Take 17 g by mouth daily. (Patient taking differently: Take 17 g by mouth daily as needed for mild constipation.) 30 each 1  . prochlorperazine (COMPAZINE) 10 MG tablet Take 10 mg by mouth every 6 (six) hours as needed for nausea or vomiting.    . senna-docusate (SENNA S) 8.6-50 MG tablet Take 2 tablets by mouth 2 (two) times daily. 60 tablet 2   No current facility-administered medications for this visit.    REVIEW OF SYSTEMS:   10 Point review of Systems was done is negative except as noted above.  PHYSICAL EXAMINATION: ECOG PERFORMANCE STATUS: 2 - Symptomatic, <50% confined to bed  . Vitals:   07/29/20 1214  BP: (!) 158/67  Pulse: 98  Resp: 18  Temp: 98.1 F (36.7 C)  SpO2: 100%   Filed Weights   07/29/20 1214  Weight: 143 lb 14.4 oz (65.3 kg)   .Body mass index is 23.95 kg/m.  Exam was given in a wheelchair.  GENERAL:alert, in no acute distress and comfortable SKIN: no acute rashes, no significant lesions EYES:  conjunctiva are pink and non-injected, sclera anicteric OROPHARYNX: MMM, no exudates, no oropharyngeal erythema or ulceration NECK: supple, no JVD LYMPH:  no palpable lymphadenopathy in the cervical, axillary or inguinal regions LUNGS: clear to auscultation b/l with normal respiratory effort HEART: regular rate & rhythm ABDOMEN:  normoactive bowel  sounds , non tender, not distended. Extremity: no pedal edema PSYCH: alert & oriented x 3 with fluent speech NEURO: no focal motor/sensory deficits  LABORATORY DATA:  I have reviewed the data as listed  . CBC Latest Ref Rng & Units 07/29/2020 07/18/2020 07/09/2020  WBC 4.0 - 10.5 K/uL 3.0(L) 2.6(L) 3.1(L)  Hemoglobin 12.0 - 15.0 g/dL 12.3 12.5 12.8  Hematocrit 36.0 - 46.0 % 37.8 38.8 38.4  Platelets 150 - 400 K/uL 210 183 267    . CMP Latest Ref Rng & Units 07/29/2020 07/18/2020 07/09/2020  Glucose 70 - 99 mg/dL 101(H) 107(H) 99  BUN 8 - 23 mg/dL '13 10 14  ' Creatinine 0.44 - 1.00 mg/dL 0.82 0.89 1.05(H)  Sodium 135 - 145 mmol/L 137 138 136  Potassium 3.5 - 5.1 mmol/L 3.6 4.0 4.7  Chloride 98 - 111 mmol/L 101 104 102  CO2 22 - 32 mmol/L '28 25 27  ' Calcium 8.9 - 10.3 mg/dL 9.0 8.4(L) 8.9  Total Protein 6.5 - 8.1 g/dL 6.3(L) 6.2(L) 6.3(L)  Total Bilirubin 0.3 - 1.2 mg/dL 0.3 0.4 0.4  Alkaline Phos 38 - 126 U/L 235(H) 160(H) 161(H)  AST 15 - 41 U/L 13(L) 12(L) 10(L)  ALT 0 - 44 U/L '23 12 10   ' Component     Latest Ref Rng & Units 07/29/2020  IgG (Immunoglobin G), Serum     586 - 1,602 mg/dL 687  IgA     64 - 422 mg/dL 27 (L)  IgM (Immunoglobulin M), Srm     26 - 217 mg/dL 35  Total Protein ELP     6.0 - 8.5 g/dL 5.7 (L)  Albumin SerPl Elph-Mcnc     2.9 - 4.4 g/dL 3.4  Alpha 1     0.0 - 0.4 g/dL 0.3  Alpha2 Glob SerPl Elph-Mcnc     0.4 - 1.0 g/dL 0.8  B-Globulin SerPl Elph-Mcnc     0.7 - 1.3 g/dL 0.7  Gamma Glob SerPl Elph-Mcnc     0.4 - 1.8 g/dL 0.6  M Protein SerPl Elph-Mcnc     Not Observed g/dL 0.3 (H)  Globulin, Total      2.2 - 3.9 g/dL 2.3  Albumin/Glob SerPl     0.7 - 1.7 1.5  IFE 1      Comment (A)  Please Note (HCV):      Comment  Kappa free light chain     3.3 - 19.4 mg/L 21.0 (H)  Lamda free light chains     5.7 - 26.3 mg/L 14.7  Kappa, lamda light chain ratio     0.26 - 1.65 1.43    RADIOGRAPHIC STUDIES: I have personally reviewed the radiological images as listed and agreed with the findings in the report. No results found.  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  Previous h/o plasmacytomas 05/15/2020 M Protein at 0.7 g/dL 05/24/2020 PET/CT ([id number]) revealed "1. There are multiple hypermetabolic lytic osseous lesions consistent with multiple myeloma. The most prominent lesion is within the left lateral mass of C1 and this likely involves the foramen transversarium. Probable T3 and sternal pathologic fractures."  2) C1 myeloma lesion   PLAN: -Discussed pt labwork today, 07/29/2020; blood counts holding steady, chemistries in progress, light chains and myeloma panel sent out today. Kappa light chains from 865 to 20 and Kappa lambda light chain ratio was decreased on prior labs.  -Advised pt  she has been responding very well to treatment. Her light chains have nearly normalized. Advised pt we may seek an additional cycle for maximized response if tolerating well. -Discussed potential for next steps following as deep and durable response as possible (>90%)-- maintenance Revlimid treatment versus an aggressive bone marrow transplant. The pt desires to move back to [facility] as fast as possible and does not desire the transplant. -The pt has no prohibitive toxicities from continuing C4D1 VRD.  -Recommend pt f/u w Neurosurgeon regarding ROM and PT related to neck.  -Recommend pt try a delivery service for groceries. -Advised pt we will repeat scans following the next 1-2 cycles.  -Continue [place]  Vitamin B12 on D1 and D8 of each treatment. -Recommended that the pt continue to eat well, drink at least 48-64 oz of water each day, and walk 20-30 minutes each day.  -Recommend pt start 1000 mcg Vitamin B12 & one B-complex daily.  -Continue daily baby ASA and Acyclovir BID. -Continue Zometa q4weeks. -Will see back in 3 weeks with C5D1.    FOLLOW UP: Please schedule remaining cycle 4 and cycle 5 of VRD chemotherapy as per orders. Labs on day 1 and day 8 of each treatment cycle. MD visit in 3 weeks with cycle 5-day 1   The total time spent in the appointment was 30 minutes and more than 50% was on counseling and direct patient cares., ordering and management of chemotherapy.   All of the patient's questions were answered with apparent satisfaction. The patient knows to call the clinic with any problems, questions or concerns.    Sullivan Lone MD [place] AAHIVMS [facility] Hematology/Oncology Physician [facility] [place] [facility]  (Office):       [phone] (Work cell):  [phone] (Fax):           [phone]  07/28/2020 10:01 AM   I, Reinaldo Raddle, am acting as scribe for Dr. Sullivan Lone, MD.    .I have reviewed the above documentation for accuracy and completeness, and I agree with the above. Brunetta Genera MD

## 2020-07-29 ENCOUNTER — Inpatient Hospital Stay: Payer: Medicare Other

## 2020-07-29 ENCOUNTER — Other Ambulatory Visit: Payer: Self-pay

## 2020-07-29 ENCOUNTER — Inpatient Hospital Stay (HOSPITAL_BASED_OUTPATIENT_CLINIC_OR_DEPARTMENT_OTHER): Payer: Medicare Other | Admitting: Hematology

## 2020-07-29 VITALS — BP 158/67 | HR 98 | Temp 98.1°F | Resp 18 | Ht 65.0 in | Wt 143.9 lb

## 2020-07-29 DIAGNOSIS — C9000 Multiple myeloma not having achieved remission: Secondary | ICD-10-CM | POA: Diagnosis not present

## 2020-07-29 DIAGNOSIS — Z5111 Encounter for antineoplastic chemotherapy: Secondary | ICD-10-CM | POA: Diagnosis not present

## 2020-07-29 DIAGNOSIS — Z7189 Other specified counseling: Secondary | ICD-10-CM

## 2020-07-29 DIAGNOSIS — E538 Deficiency of other specified B group vitamins: Secondary | ICD-10-CM | POA: Diagnosis not present

## 2020-07-29 LAB — CBC WITH DIFFERENTIAL/PLATELET
Abs Immature Granulocytes: 0.02 10*3/uL (ref 0.00–0.07)
Basophils Absolute: 0.1 10*3/uL (ref 0.0–0.1)
Basophils Relative: 2 %
Eosinophils Absolute: 0.2 10*3/uL (ref 0.0–0.5)
Eosinophils Relative: 7 %
HCT: 37.8 % (ref 36.0–46.0)
Hemoglobin: 12.3 g/dL (ref 12.0–15.0)
Immature Granulocytes: 1 %
Lymphocytes Relative: 21 %
Lymphs Abs: 0.6 10*3/uL — ABNORMAL LOW (ref 0.7–4.0)
MCH: 29.9 pg (ref 26.0–34.0)
MCHC: 32.5 g/dL (ref 30.0–36.0)
MCV: 91.7 fL (ref 80.0–100.0)
Monocytes Absolute: 0.4 10*3/uL (ref 0.1–1.0)
Monocytes Relative: 13 %
Neutro Abs: 1.7 10*3/uL (ref 1.7–7.7)
Neutrophils Relative %: 56 %
Platelets: 210 10*3/uL (ref 150–400)
RBC: 4.12 MIL/uL (ref 3.87–5.11)
RDW: 14.6 % (ref 11.5–15.5)
WBC: 3 10*3/uL — ABNORMAL LOW (ref 4.0–10.5)
nRBC: 0 % (ref 0.0–0.2)

## 2020-07-29 LAB — CMP (CANCER CENTER ONLY)
ALT: 23 U/L (ref 0–44)
AST: 13 U/L — ABNORMAL LOW (ref 15–41)
Albumin: 3.4 g/dL — ABNORMAL LOW (ref 3.5–5.0)
Alkaline Phosphatase: 235 U/L — ABNORMAL HIGH (ref 38–126)
Anion gap: 8 (ref 5–15)
BUN: 13 mg/dL (ref 8–23)
CO2: 28 mmol/L (ref 22–32)
Calcium: 9 mg/dL (ref 8.9–10.3)
Chloride: 101 mmol/L (ref 98–111)
Creatinine: 0.82 mg/dL (ref 0.44–1.00)
GFR, Estimated: >60 mL/min (ref >60)
Glucose, Bld: 101 mg/dL — ABNORMAL HIGH (ref 70–99)
Potassium: 3.6 mmol/L (ref 3.5–5.1)
Sodium: 137 mmol/L (ref 135–145)
Total Bilirubin: 0.3 mg/dL (ref 0.3–1.2)
Total Protein: 6.3 g/dL — ABNORMAL LOW (ref 6.5–8.1)

## 2020-07-29 MED ORDER — CYANOCOBALAMIN 1000 MCG/ML IJ SOLN
INTRAMUSCULAR | Status: AC
  Filled 2020-07-29: qty 1

## 2020-07-29 MED ORDER — CYANOCOBALAMIN 1000 MCG/ML IJ SOLN
1000.0000 ug | Freq: Once | INTRAMUSCULAR | Status: AC
  Administered 2020-07-29: 1000 ug via SUBCUTANEOUS

## 2020-07-29 MED ORDER — BORTEZOMIB CHEMO SQ INJECTION 3.5 MG (2.5MG/ML)
1.3000 mg/m2 | Freq: Once | INTRAMUSCULAR | Status: AC
  Administered 2020-07-29: 2.5 mg via SUBCUTANEOUS
  Filled 2020-07-29: qty 1

## 2020-07-29 MED ORDER — ZOLEDRONIC ACID 4 MG/100ML IV SOLN
INTRAVENOUS | Status: AC
  Filled 2020-07-29: qty 100

## 2020-07-29 MED ORDER — ZOLEDRONIC ACID 4 MG/100ML IV SOLN
4.0000 mg | Freq: Once | INTRAVENOUS | Status: AC
  Administered 2020-07-29: 4 mg via INTRAVENOUS

## 2020-07-29 MED ORDER — PROCHLORPERAZINE MALEATE 10 MG PO TABS
ORAL_TABLET | ORAL | Status: AC
  Filled 2020-07-29: qty 1

## 2020-07-29 MED ORDER — PROCHLORPERAZINE MALEATE 10 MG PO TABS
10.0000 mg | ORAL_TABLET | Freq: Once | ORAL | Status: AC
  Administered 2020-07-29: 10 mg via ORAL

## 2020-07-29 MED ORDER — SODIUM CHLORIDE 0.9 % IV SOLN
Freq: Once | INTRAVENOUS | Status: AC
  Filled 2020-07-29: qty 250

## 2020-07-30 ENCOUNTER — Other Ambulatory Visit: Payer: Self-pay

## 2020-07-30 DIAGNOSIS — C9000 Multiple myeloma not having achieved remission: Secondary | ICD-10-CM

## 2020-07-30 LAB — KAPPA/LAMBDA LIGHT CHAINS
Kappa free light chain: 21 mg/L — ABNORMAL HIGH (ref 3.3–19.4)
Kappa, lambda light chain ratio: 1.43 (ref 0.26–1.65)
Lambda free light chains: 14.7 mg/L (ref 5.7–26.3)

## 2020-07-30 MED ORDER — LENALIDOMIDE 15 MG PO CAPS: ORAL_CAPSULE | ORAL | 0 refills | Status: DC

## 2020-07-31 LAB — MULTIPLE MYELOMA PANEL, SERUM
Albumin SerPl Elph-Mcnc: 3.4 g/dL (ref 2.9–4.4)
Albumin/Glob SerPl: 1.5 (ref 0.7–1.7)
Alpha 1: 0.3 g/dL (ref 0.0–0.4)
Alpha2 Glob SerPl Elph-Mcnc: 0.8 g/dL (ref 0.4–1.0)
B-Globulin SerPl Elph-Mcnc: 0.7 g/dL (ref 0.7–1.3)
Gamma Glob SerPl Elph-Mcnc: 0.6 g/dL (ref 0.4–1.8)
Globulin, Total: 2.3 g/dL (ref 2.2–3.9)
IgA: 27 mg/dL — ABNORMAL LOW (ref 64–422)
IgG (Immunoglobin G), Serum: 687 mg/dL (ref 586–1602)
IgM (Immunoglobulin M), Srm: 35 mg/dL (ref 26–217)
M Protein SerPl Elph-Mcnc: 0.3 g/dL — ABNORMAL HIGH
Total Protein ELP: 5.7 g/dL — ABNORMAL LOW (ref 6.0–8.5)

## 2020-08-01 ENCOUNTER — Inpatient Hospital Stay: Payer: Medicare Other | Attending: Hematology

## 2020-08-01 ENCOUNTER — Other Ambulatory Visit: Payer: Self-pay

## 2020-08-01 ENCOUNTER — Other Ambulatory Visit: Payer: Self-pay | Admitting: Hematology

## 2020-08-01 ENCOUNTER — Encounter: Payer: Self-pay | Admitting: Hematology

## 2020-08-01 VITALS — BP 132/54 | HR 92 | Temp 98.0°F | Resp 17 | Wt 142.8 lb

## 2020-08-01 DIAGNOSIS — Z5111 Encounter for antineoplastic chemotherapy: Secondary | ICD-10-CM | POA: Insufficient documentation

## 2020-08-01 DIAGNOSIS — E538 Deficiency of other specified B group vitamins: Secondary | ICD-10-CM | POA: Diagnosis not present

## 2020-08-01 DIAGNOSIS — C9000 Multiple myeloma not having achieved remission: Secondary | ICD-10-CM | POA: Insufficient documentation

## 2020-08-01 DIAGNOSIS — R52 Pain, unspecified: Secondary | ICD-10-CM

## 2020-08-01 DIAGNOSIS — Z7189 Other specified counseling: Secondary | ICD-10-CM

## 2020-08-01 MED ORDER — BORTEZOMIB CHEMO SQ INJECTION 3.5 MG (2.5MG/ML)
1.3000 mg/m2 | Freq: Once | INTRAMUSCULAR | Status: AC
  Administered 2020-08-01: 2.25 mg via SUBCUTANEOUS
  Filled 2020-08-01: qty 0.9

## 2020-08-01 MED ORDER — PROCHLORPERAZINE MALEATE 10 MG PO TABS
10.0000 mg | ORAL_TABLET | Freq: Once | ORAL | Status: AC
  Administered 2020-08-01: 10 mg via ORAL

## 2020-08-01 MED ORDER — CYCLOBENZAPRINE HCL 5 MG PO TABS: 5.0000 mg | ORAL_TABLET | Freq: Three times a day (TID) | ORAL | 0 refills | Status: DC | PRN

## 2020-08-01 MED ORDER — OXYCODONE-ACETAMINOPHEN 5-325 MG PO TABS: 1.0000 | ORAL_TABLET | Freq: Four times a day (QID) | ORAL | 0 refills | Status: DC | PRN

## 2020-08-01 MED ORDER — PROCHLORPERAZINE MALEATE 10 MG PO TABS
ORAL_TABLET | ORAL | Status: AC
  Filled 2020-08-01: qty 1

## 2020-08-01 NOTE — Progress Notes (Signed)
Dose of Velcade adj for new wt.  Ok'd by Dr. Irene Limbo.  Kennith Center, Pharm.D., CPP 08/01/2020@3 :32 PM

## 2020-08-05 ENCOUNTER — Other Ambulatory Visit: Payer: Self-pay | Admitting: Hematology

## 2020-08-05 ENCOUNTER — Other Ambulatory Visit: Payer: Self-pay

## 2020-08-05 ENCOUNTER — Inpatient Hospital Stay: Payer: Medicare Other

## 2020-08-05 VITALS — BP 132/64 | HR 93 | Temp 98.7°F | Resp 17 | Wt 142.0 lb

## 2020-08-05 DIAGNOSIS — Z7189 Other specified counseling: Secondary | ICD-10-CM

## 2020-08-05 DIAGNOSIS — Z5111 Encounter for antineoplastic chemotherapy: Secondary | ICD-10-CM

## 2020-08-05 DIAGNOSIS — C9000 Multiple myeloma not having achieved remission: Secondary | ICD-10-CM

## 2020-08-05 LAB — CBC WITH DIFFERENTIAL/PLATELET
Abs Immature Granulocytes: 0.08 10*3/uL — ABNORMAL HIGH (ref 0.00–0.07)
Basophils Absolute: 0 10*3/uL (ref 0.0–0.1)
Basophils Relative: 1 %
Eosinophils Absolute: 0.5 10*3/uL (ref 0.0–0.5)
Eosinophils Relative: 16 %
HCT: 38.4 % (ref 36.0–46.0)
Hemoglobin: 12.6 g/dL (ref 12.0–15.0)
Immature Granulocytes: 3 %
Lymphocytes Relative: 20 %
Lymphs Abs: 0.6 10*3/uL — ABNORMAL LOW (ref 0.7–4.0)
MCH: 30.5 pg (ref 26.0–34.0)
MCHC: 32.8 g/dL (ref 30.0–36.0)
MCV: 93 fL (ref 80.0–100.0)
Monocytes Absolute: 0.4 10*3/uL (ref 0.1–1.0)
Monocytes Relative: 12 %
Neutro Abs: 1.5 10*3/uL — ABNORMAL LOW (ref 1.7–7.7)
Neutrophils Relative %: 48 %
Platelets: 165 10*3/uL (ref 150–400)
RBC: 4.13 MIL/uL (ref 3.87–5.11)
RDW: 15.2 % (ref 11.5–15.5)
WBC: 3.2 10*3/uL — ABNORMAL LOW (ref 4.0–10.5)
nRBC: 0 % (ref 0.0–0.2)

## 2020-08-05 LAB — CMP (CANCER CENTER ONLY)
ALT: 10 U/L (ref 0–44)
AST: 10 U/L — ABNORMAL LOW (ref 15–41)
Albumin: 3.3 g/dL — ABNORMAL LOW (ref 3.5–5.0)
Alkaline Phosphatase: 149 U/L — ABNORMAL HIGH (ref 38–126)
Anion gap: 8 (ref 5–15)
BUN: 11 mg/dL (ref 8–23)
CO2: 25 mmol/L (ref 22–32)
Calcium: 7.7 mg/dL — ABNORMAL LOW (ref 8.9–10.3)
Chloride: 106 mmol/L (ref 98–111)
Creatinine: 0.76 mg/dL (ref 0.44–1.00)
GFR, Estimated: >60 mL/min (ref >60)
Glucose, Bld: 95 mg/dL (ref 70–99)
Potassium: 4.5 mmol/L (ref 3.5–5.1)
Sodium: 139 mmol/L (ref 135–145)
Total Bilirubin: 0.4 mg/dL (ref 0.3–1.2)
Total Protein: 5.8 g/dL — ABNORMAL LOW (ref 6.5–8.1)

## 2020-08-05 MED ORDER — PROCHLORPERAZINE MALEATE 10 MG PO TABS
10.0000 mg | ORAL_TABLET | Freq: Once | ORAL | Status: AC
  Administered 2020-08-05: 10 mg via ORAL

## 2020-08-05 MED ORDER — PROCHLORPERAZINE MALEATE 10 MG PO TABS
ORAL_TABLET | ORAL | Status: AC
  Filled 2020-08-05: qty 1

## 2020-08-05 MED ORDER — CYANOCOBALAMIN 1000 MCG/ML IJ SOLN
1000.0000 ug | Freq: Once | INTRAMUSCULAR | Status: AC
  Administered 2020-08-05: 1000 ug via SUBCUTANEOUS

## 2020-08-05 MED ORDER — BORTEZOMIB CHEMO SQ INJECTION 3.5 MG (2.5MG/ML)
1.3000 mg/m2 | Freq: Once | INTRAMUSCULAR | Status: AC
  Administered 2020-08-05: 2.25 mg via SUBCUTANEOUS
  Filled 2020-08-05: qty 0.9

## 2020-08-05 MED ORDER — CYANOCOBALAMIN 1000 MCG/ML IJ SOLN
INTRAMUSCULAR | Status: AC
  Filled 2020-08-05: qty 1

## 2020-08-05 NOTE — Patient Instructions (Signed)
[facility] Discharge Instructions for Patients Receiving Chemotherapy  Today you received the following chemotherapy agents: bortezomib.  To help prevent nausea and vomiting after your treatment, we encourage you to take your nausea medication as directed.   If you develop nausea and vomiting that is not controlled by your nausea medication, call the clinic.   BELOW ARE SYMPTOMS THAT SHOULD BE REPORTED IMMEDIATELY:  *FEVER GREATER THAN 100.5 F  *CHILLS WITH OR WITHOUT FEVER  NAUSEA AND VOMITING THAT IS NOT CONTROLLED WITH YOUR NAUSEA MEDICATION  *UNUSUAL SHORTNESS OF BREATH  *UNUSUAL BRUISING OR BLEEDING  TENDERNESS IN MOUTH AND THROAT WITH OR WITHOUT PRESENCE OF ULCERS  *URINARY PROBLEMS  *BOWEL PROBLEMS  UNUSUAL RASH Items with * indicate a potential emergency and should be followed up as soon as possible.  Feel free to call the clinic should you have any questions or concerns. The clinic phone number is [phone].  Please show the CHEMO ALERT CARD at check-in to the Emergency Department and triage nurse.   

## 2020-08-08 ENCOUNTER — Inpatient Hospital Stay: Payer: Medicare Other

## 2020-08-08 ENCOUNTER — Other Ambulatory Visit: Payer: Self-pay

## 2020-08-08 VITALS — BP 141/78 | HR 99 | Temp 98.3°F | Resp 18

## 2020-08-08 DIAGNOSIS — C9000 Multiple myeloma not having achieved remission: Secondary | ICD-10-CM

## 2020-08-08 DIAGNOSIS — Z7189 Other specified counseling: Secondary | ICD-10-CM

## 2020-08-08 MED ORDER — PROCHLORPERAZINE MALEATE 10 MG PO TABS
ORAL_TABLET | ORAL | Status: AC
  Filled 2020-08-08: qty 1

## 2020-08-08 MED ORDER — BORTEZOMIB CHEMO SQ INJECTION 3.5 MG (2.5MG/ML)
1.3000 mg/m2 | Freq: Once | INTRAMUSCULAR | Status: AC
  Administered 2020-08-08: 2.25 mg via SUBCUTANEOUS
  Filled 2020-08-08: qty 0.9

## 2020-08-08 MED ORDER — PROCHLORPERAZINE MALEATE 10 MG PO TABS
10.0000 mg | ORAL_TABLET | Freq: Once | ORAL | Status: AC
  Administered 2020-08-08: 10 mg via ORAL

## 2020-08-08 NOTE — Patient Instructions (Signed)
[facility] Discharge Instructions for Patients Receiving Chemotherapy  Today you received the following chemotherapy agents: bortezomib.  To help prevent nausea and vomiting after your treatment, we encourage you to take your nausea medication as directed.   If you develop nausea and vomiting that is not controlled by your nausea medication, call the clinic.   BELOW ARE SYMPTOMS THAT SHOULD BE REPORTED IMMEDIATELY:  *FEVER GREATER THAN 100.5 F  *CHILLS WITH OR WITHOUT FEVER  NAUSEA AND VOMITING THAT IS NOT CONTROLLED WITH YOUR NAUSEA MEDICATION  *UNUSUAL SHORTNESS OF BREATH  *UNUSUAL BRUISING OR BLEEDING  TENDERNESS IN MOUTH AND THROAT WITH OR WITHOUT PRESENCE OF ULCERS  *URINARY PROBLEMS  *BOWEL PROBLEMS  UNUSUAL RASH Items with * indicate a potential emergency and should be followed up as soon as possible.  Feel free to call the clinic should you have any questions or concerns. The clinic phone number is [phone].  Please show the CHEMO ALERT CARD at check-in to the Emergency Department and triage nurse.   

## 2020-08-13 ENCOUNTER — Encounter: Payer: Self-pay | Admitting: Hematology

## 2020-08-14 ENCOUNTER — Other Ambulatory Visit: Payer: Self-pay | Admitting: *Deleted

## 2020-08-14 DIAGNOSIS — C9000 Multiple myeloma not having achieved remission: Secondary | ICD-10-CM

## 2020-08-14 MED ORDER — OXYCODONE-ACETAMINOPHEN 5-325 MG PO TABS: 1.0000 | ORAL_TABLET | Freq: Four times a day (QID) | ORAL | 0 refills | Status: DC | PRN

## 2020-08-14 NOTE — Telephone Encounter (Signed)
Refill of oxycodone requested via mychart - routed to Dr. Irene Limbo

## 2020-08-18 NOTE — Progress Notes (Signed)
HEMATOLOGY/ONCOLOGY CONSULTATION NOTE  Date of Service: 08/18/2020  Patient Care Team: [name], NP as PCP - General (Adult Health Nurse Practitioner)  CHIEF COMPLAINTS/PURPOSE OF CONSULTATION:  mx of myeloma  HISTORY OF PRESENTING ILLNESS:  Lisa Lester is a wonderful 75 y.o. female who has been referred to [place] by Dr. 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 Pt is accompanied today by her nephew. The pt reports that she is doing well overall.   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 To treat this the pt had a Kyphoplasty and radiation therapy. Pt also had a BM Bx, urinalysis, and a bone scan completed at the time. There was no official diagnosis of MGUS or Multiple Myeloma at this time, but they continued to monitor.   A few weeks ago ptt began having significant pain in her chest, which was thought to be a fractured sternum. Pt describes the pain as "flesh being ripped off of her bones". She was also having left-sided neck and back discomfort at this time. Pt was taken to the hospital where her pain was treated and she received a right Iliac Bx, BM Bx, and CT Chest while admitted. They did not complete any whole body imaging at this time.   She had a root canal nearly 1.5 months ago. Pt discontinued taking Thyroid replacement 5-6 years ago. Pt is also no longer taking any HTN medications. She was asked to restart Gabapentin yesterday, which she was given for pain control, but felt ill after taking it. She continues taking Xanax and Hydrocodone every 4 hours. Pt reports some constipation and takes Colace occasionally.   Pt had COVID19 in July of 2020. Her husband also had the virus which caused his passing. Pt has received two doses of the COVID19 vaccine but has not received the booster.  Of note since the patient's last visit, pt has had CT Chest ([id number]) completed on 04/09/2020  with results revealing "1. Chronic posterior surgical fusion of L2 and L4 with bilateral transpedicular screws and bridging rods. Hardware intact appearing. 2. Status post vertebroplasty of L3. There is an underlying lytic lesion at the L3 vertebral body which extends into its left pedicle. These findings correlate with the provided history of a known plasmacytoma. 3. There are numerous lytic skeletal lesions at the chest, abdomen and pelvis, more numerous at the spine and ribs. 4. Multiple lytic lesions are present at the sternum, with interval development of a pathologic fracture at the sternum without significant displacement. 5. Stable mild superior endplate compression deformity at T11. 6. 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 Similar findings were present on prior study from 03/30/12, the degree of biliary ductal dilation has worsened as compared to the prior study. No choledochal stone identified.   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  Most recent lab results (04/11/2020) of CBC w/diff is as follows: all values are WNL except for Lymphs Rel  at 18.5. 04/10/2020 CMP is as follows: all values are WNL except for CO2 at 33, Glucose at 120, Calcium at 11.6, Anion gap at 3.  On review of systems, pt reports constipation, chest pain, neck pain, back pain and denies dysuria and any other symptoms.   On PMHx the pt reports Kyphoplasty,  Plasmacytoma, HTN, Thyroid disorder. On Social Hx the pt reports that she is a non-smoker and only drinks alcohol socially.   INTERVAL HISTORY:  Lisa Lester is a wonderful 75 y.o. 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 The patient's last visit with [place] was on 07/23/2020. The pt reports that she is doing well overall. She is here for C5D1 VRD chemotherapy.   The pt reports improving neck pain. Stilll using neck brace. No other overt treatment related toxicities. Discussed goals of care. Patient is very clear she does not want to consider HDT-AutoHSCT. She is keen to return to her house in [place] soon.  Lab results today 08/19/2020 of CBC w/diff and CMP -stable Myeloma panel - M spike @ 0.3g/dl Kappa light chains decreased from 865 down to 21 On review of systems, pt reports no other acute new symptoms.  MEDICAL HISTORY:  Past Medical History:  Diagnosis Date  . Multiple myeloma ([place])   . Pathologic fracture    L3  . Plasmacytoma ([place])     SURGICAL HISTORY: Past Surgical History:  Procedure Laterality Date  . BACK SURGERY    . BREAST ENHANCEMENT SURGERY Bilateral   . LUMBAR FUSION     L2 - L4, hx of pathologic fx of L3  . RADIOLOGY WITH ANESTHESIA N/A 06/13/2020   Procedure: MRI BRAIN WITHOUT; C-SPINE WITHOUT;  Surgeon: Radiologist, Medication, MD;  Location: [facility];  Service: Radiology;  Laterality: N/A;    SOCIAL HISTORY: Social History   Socioeconomic History  . Marital status: Widowed    Spouse name: russell Gick  . Number of children: 0  . Years of education: 18  . Highest education level: 12th grade  Occupational History  . Not on file  Tobacco Use  . Smoking status: Never Smoker  . Smokeless tobacco: Never Used  Vaping Use  . Vaping Use: Never used  Substance and Sexual Activity  . Alcohol use: Never  . Drug use: Never  . Sexual activity: Not Currently  Other Topics Concern  . Not on file  Social History Narrative  . Not  on file   Social Determinants of Health   Financial Resource Strain: Not on file  Food Insecurity: Not on file  Transportation Needs: Not on file  Physical Activity: Not on file  Stress: Not on file  Social Connections: Not on file  Intimate Partner Violence: Not on file    FAMILY HISTORY: Family History  Problem Relation Age of Onset  . Cancer - Other Mother   . Cancer - Other Father     ALLERGIES:  has No Known Allergies.  MEDICATIONS:  Current Outpatient Medications  Medication Sig Dispense Refill  . acyclovir (ZOVIRAX) 400 MG tablet Take 400 mg by mouth 2 (two) times daily.    Marland Kitchen ALPRAZolam (XANAX) 0.5 MG tablet Take 1 tablet (0.5 mg total) by mouth 3 (three) times daily as needed for anxiety. 15 tablet 0  . aspirin EC 81 MG tablet Take 81 mg by mouth daily. Swallow whole.    . Cyanocobalamin (B-12) 1000 MCG SUBL Place 1,000 mcg under the tongue daily. 60 tablet 2  . cyclobenzaprine (  FLEXERIL) 5 MG tablet Take 1 tablet (5 mg total) by mouth 3 (three) times daily as needed for muscle spasms. 30 tablet 0  . DULoxetine (CYMBALTA) 30 MG capsule TAKE 1 CAPSULE BY MOUTH EVERY DAY 90 capsule 2  . fentaNYL (DURAGESIC) 25 MCG/HR Place 1 patch onto the skin every 3 (three) days. 10 patch 0  . lenalidomide (REVLIMID) 15 MG capsule TAKE 1 CAPSULE BY MOUTH  DAILY FOR 14 DAYS, THEN 7  DAYS OFF. REPEAT EVERY 21  DAYS 14 capsule 0  . lidocaine (LIDODERM) 5 % Place 1 patch onto the skin daily. Remove & Discard patch within 12 hours or as directed by MD on the neck 30 patch 5  . meloxicam (MOBIC) 15 MG tablet Take 15 mg by mouth daily.    Marland Kitchen nystatin (MYCOSTATIN) 100000 UNIT/ML suspension Take 5 mLs (500,000 Units total) by mouth 4 (four) times daily. 200 mL 0  . ondansetron (ZOFRAN) 8 MG tablet Take 8 mg by mouth every 8 (eight) hours as needed for nausea or vomiting.    Marland Kitchen oxyCODONE-acetaminophen (PERCOCET/ROXICET) 5-325 MG tablet Take 1 tablet by mouth every 6 (six) hours as needed for  moderate pain or severe pain. 60 tablet 0  . pantoprazole (PROTONIX) 40 MG tablet Take 1 tablet (40 mg total) by mouth daily. 30 tablet 1  . polyethylene glycol (MIRALAX) 17 g packet Take 17 g by mouth daily. (Patient taking differently: Take 17 g by mouth daily as needed for mild constipation.) 30 each 1  . prochlorperazine (COMPAZINE) 10 MG tablet Take 10 mg by mouth every 6 (six) hours as needed for nausea or vomiting.    . senna-docusate (SENNA S) 8.6-50 MG tablet Take 2 tablets by mouth 2 (two) times daily. 60 tablet 2   No current facility-administered medications for this visit.    REVIEW OF SYSTEMS:   10 Point review of Systems was done is negative except as noted above.  PHYSICAL EXAMINATION: ECOG PERFORMANCE STATUS: 2 - Symptomatic, <50% confined to bed  . Vitals:   08/19/20 1430  BP: (!) 130/56  Pulse: 95  Resp: 18  Temp: (!) 97 F (36.1 C)  SpO2: 99%   Filed Weights   08/19/20 1430  Weight: 144 lb 14.4 oz (65.7 kg)   .Body mass index is 24.11 kg/m.  NAD GENERAL:alert, in no acute distress and comfortable SKIN: no acute rashes, no significant lesions EYES: conjunctiva are pink and non-injected, sclera anicteric OROPHARYNX: MMM, no exudates, no oropharyngeal erythema or ulceration NECK: supple, no JVD LYMPH:  no palpable lymphadenopathy in the cervical, axillary or inguinal regions LUNGS: clear to auscultation b/l with normal respiratory effort HEART: regular rate & rhythm ABDOMEN:  normoactive bowel sounds , non tender, not distended. Extremity: no pedal edema PSYCH: alert & oriented x 3 with fluent speech NEURO: no focal motor/sensory deficits  LABORATORY DATA:  I have reviewed the data as listed  . CBC Latest Ref Rng & Units 08/05/2020 07/29/2020 07/18/2020  WBC 4.0 - 10.5 K/uL 3.2(L) 3.0(L) 2.6(L)  Hemoglobin 12.0 - 15.0 g/dL 12.6 12.3 12.5  Hematocrit 36.0 - 46.0 % 38.4 37.8 38.8  Platelets 150 - 400 K/uL 165 210 183    . CMP Latest Ref Rng & Units  08/05/2020 07/29/2020 07/18/2020  Glucose 70 - 99 mg/dL 95 101(H) 107(H)  BUN 8 - 23 mg/dL '11 13 10  ' Creatinine 0.44 - 1.00 mg/dL 0.76 0.82 0.89  Sodium 135 - 145 mmol/L 139 137 138  Potassium 3.5 -  5.1 mmol/L 4.5 3.6 4.0  Chloride 98 - 111 mmol/L 106 101 104  CO2 22 - 32 mmol/L '25 28 25  ' Calcium 8.9 - 10.3 mg/dL 7.7(L) 9.0 8.4(L)  Total Protein 6.5 - 8.1 g/dL 5.8(L) 6.3(L) 6.2(L)  Total Bilirubin 0.3 - 1.2 mg/dL 0.4 0.3 0.4  Alkaline Phos 38 - 126 U/L 149(H) 235(H) 160(H)  AST 15 - 41 U/L 10(L) 13(L) 12(L)  ALT 0 - 44 U/L '10 23 12   ' Component     Latest Ref Rng & Units 07/29/2020  IgG (Immunoglobin G), Serum     586 - 1,602 mg/dL 687  IgA     64 - 422 mg/dL 27 (L)  IgM (Immunoglobulin M), Srm     26 - 217 mg/dL 35  Total Protein ELP     6.0 - 8.5 g/dL 5.7 (L)  Albumin SerPl Elph-Mcnc     2.9 - 4.4 g/dL 3.4  Alpha 1     0.0 - 0.4 g/dL 0.3  Alpha2 Glob SerPl Elph-Mcnc     0.4 - 1.0 g/dL 0.8  B-Globulin SerPl Elph-Mcnc     0.7 - 1.3 g/dL 0.7  Gamma Glob SerPl Elph-Mcnc     0.4 - 1.8 g/dL 0.6  M Protein SerPl Elph-Mcnc     Not Observed g/dL 0.3 (H)  Globulin, Total     2.2 - 3.9 g/dL 2.3  Albumin/Glob SerPl     0.7 - 1.7 1.5  IFE 1      Comment (A)  Please Note (HCV):      Comment  Kappa free light chain     3.3 - 19.4 mg/L 21.0 (H)  Lamda free light chains     5.7 - 26.3 mg/L 14.7  Kappa, lamda light chain ratio     0.26 - 1.65 1.43    RADIOGRAPHIC STUDIES: I have personally reviewed the radiological images as listed and agreed with the findings in the report. No results found.  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  Previous h/o plasmacytomas 05/15/2020 M Protein at 0.7 g/dL 05/24/2020 PET/CT ([id number]) revealed "1. There are multiple hypermetabolic lytic osseous lesions consistent with multiple myeloma. The most prominent lesion is within the left lateral  mass of C1 and this likely involves the foramen transversarium. Probable T3 and sternal pathologic fractures."  2) C1 myeloma lesion status post palliative radiation with improvement in pain  PLAN: -Discussed pt labwork today, 08/19/2020; hemoglobin 11.4, leukocyte count 3.2k, platelets 198k -Kappa light chains down to near normal at 21 from a pretreatment level of 865. -M protein down from 0.7 down to 0.3 g/dL. -The pt has no prohibitive toxicities from continuing C5D1 VRD.  -Declines any consideration for HDT and autologous hematopoietic stem cell transplant. -Patient prefers to return to [facility] to continue treatment at [facility] once she is deemed stable to move back. -We discussed repeating a PET scan and bone marrow biopsy to evaluate treatment response since her light chains have now normalized. -Continue [place] Vitamin B12 on D1 and D8 of each treatment. -Recommended that the pt continue to eat well, drink at least 48-64 oz of water each day, and walk 20-30 minutes each day.  -Continue 1000 mcg Vitamin B12 & one B-complex daily.  -Continue daily baby ASA and Acyclovir BID. -Continue Zometa q4weeks.     FOLLOW UP:  PLZ schedule remaining C5 of treatment  as per orders. Labs on D1 and D8 of each treatment cycle. Plz schedule C6 of treatment as per orders Labs on D1 and D8 of each treatment cycle. PET/CT in 2 weeks CT bone marrow biopsy in 2 weeks MD visit in 3 weeks with C6D1 of treatment.     The total time spent in the appointment was 35 minutes and more than 50% was on counseling and direct patient cares.   All of the patient's questions were answered with apparent satisfaction. The patient knows to call the clinic with any problems, questions or concerns.    Sullivan Lone MD [place] AAHIVMS [facility] Hematology/Oncology Physician [facility]  (Office):       [phone] (Work cell):  [phone] (Fax):           [phone]  08/18/2020 9:37  AM   I, Reinaldo Raddle, am acting as scribe for Dr. Sullivan Lone, MD.   .I have reviewed the above documentation for accuracy and completeness, and I agree with the above. Brunetta Genera MD

## 2020-08-19 ENCOUNTER — Inpatient Hospital Stay: Payer: Medicare Other

## 2020-08-19 ENCOUNTER — Inpatient Hospital Stay (HOSPITAL_BASED_OUTPATIENT_CLINIC_OR_DEPARTMENT_OTHER): Payer: Medicare Other | Admitting: Hematology

## 2020-08-19 ENCOUNTER — Ambulatory Visit: Payer: Medicare Other

## 2020-08-19 ENCOUNTER — Other Ambulatory Visit: Payer: Self-pay

## 2020-08-19 VITALS — BP 130/56 | HR 95 | Temp 97.0°F | Resp 18 | Ht 65.0 in | Wt 144.9 lb

## 2020-08-19 DIAGNOSIS — Z5111 Encounter for antineoplastic chemotherapy: Secondary | ICD-10-CM

## 2020-08-19 DIAGNOSIS — C9000 Multiple myeloma not having achieved remission: Secondary | ICD-10-CM | POA: Diagnosis not present

## 2020-08-19 DIAGNOSIS — E538 Deficiency of other specified B group vitamins: Secondary | ICD-10-CM

## 2020-08-19 DIAGNOSIS — Z7189 Other specified counseling: Secondary | ICD-10-CM

## 2020-08-19 LAB — CBC WITH DIFFERENTIAL/PLATELET
Abs Immature Granulocytes: 0.05 10*3/uL (ref 0.00–0.07)
Basophils Absolute: 0 10*3/uL (ref 0.0–0.1)
Basophils Relative: 1 %
Eosinophils Absolute: 0.2 10*3/uL (ref 0.0–0.5)
Eosinophils Relative: 7 %
HCT: 34.9 % — ABNORMAL LOW (ref 36.0–46.0)
Hemoglobin: 11.4 g/dL — ABNORMAL LOW (ref 12.0–15.0)
Immature Granulocytes: 2 %
Lymphocytes Relative: 16 %
Lymphs Abs: 0.5 10*3/uL — ABNORMAL LOW (ref 0.7–4.0)
MCH: 30.6 pg (ref 26.0–34.0)
MCHC: 32.7 g/dL (ref 30.0–36.0)
MCV: 93.6 fL (ref 80.0–100.0)
Monocytes Absolute: 0.3 10*3/uL (ref 0.1–1.0)
Monocytes Relative: 11 %
Neutro Abs: 2 10*3/uL (ref 1.7–7.7)
Neutrophils Relative %: 63 %
Platelets: 198 10*3/uL (ref 150–400)
RBC: 3.73 MIL/uL — ABNORMAL LOW (ref 3.87–5.11)
RDW: 15.6 % — ABNORMAL HIGH (ref 11.5–15.5)
WBC: 3.2 10*3/uL — ABNORMAL LOW (ref 4.0–10.5)
nRBC: 0 % (ref 0.0–0.2)

## 2020-08-19 LAB — CMP (CANCER CENTER ONLY)
ALT: 8 U/L (ref 0–44)
AST: 9 U/L — ABNORMAL LOW (ref 15–41)
Albumin: 3 g/dL — ABNORMAL LOW (ref 3.5–5.0)
Alkaline Phosphatase: 108 U/L (ref 38–126)
Anion gap: 7 (ref 5–15)
BUN: 11 mg/dL (ref 8–23)
CO2: 27 mmol/L (ref 22–32)
Calcium: 7.9 mg/dL — ABNORMAL LOW (ref 8.9–10.3)
Chloride: 106 mmol/L (ref 98–111)
Creatinine: 0.79 mg/dL (ref 0.44–1.00)
GFR, Estimated: >60 mL/min (ref >60)
Glucose, Bld: 93 mg/dL (ref 70–99)
Potassium: 3.6 mmol/L (ref 3.5–5.1)
Sodium: 140 mmol/L (ref 135–145)
Total Bilirubin: 0.3 mg/dL (ref 0.3–1.2)
Total Protein: 5.5 g/dL — ABNORMAL LOW (ref 6.5–8.1)

## 2020-08-19 MED ORDER — CYANOCOBALAMIN 1000 MCG/ML IJ SOLN
1000.0000 ug | Freq: Once | INTRAMUSCULAR | Status: AC
  Administered 2020-08-19: 1000 ug via SUBCUTANEOUS

## 2020-08-19 MED ORDER — CYANOCOBALAMIN 1000 MCG/ML IJ SOLN
INTRAMUSCULAR | Status: AC
  Filled 2020-08-19: qty 1

## 2020-08-19 MED ORDER — BORTEZOMIB CHEMO SQ INJECTION 3.5 MG (2.5MG/ML)
1.3000 mg/m2 | Freq: Once | INTRAMUSCULAR | Status: AC
  Administered 2020-08-19: 2.25 mg via SUBCUTANEOUS
  Filled 2020-08-19: qty 0.9

## 2020-08-19 MED ORDER — PROCHLORPERAZINE MALEATE 10 MG PO TABS
10.0000 mg | ORAL_TABLET | Freq: Once | ORAL | Status: AC
  Administered 2020-08-19: 10 mg via ORAL

## 2020-08-19 MED ORDER — ERGOCALCIFEROL 1.25 MG (50000 UT) PO CAPS: 50000.0000 [IU] | ORAL_CAPSULE | ORAL | 2 refills | Status: DC

## 2020-08-19 MED ORDER — OXYCODONE-ACETAMINOPHEN 5-325 MG PO TABS: 1.0000 | ORAL_TABLET | Freq: Four times a day (QID) | ORAL | 0 refills | Status: DC | PRN

## 2020-08-19 MED ORDER — PROCHLORPERAZINE MALEATE 10 MG PO TABS
ORAL_TABLET | ORAL | Status: AC
  Filled 2020-08-19: qty 1

## 2020-08-19 NOTE — Patient Instructions (Signed)
[facility] Discharge Instructions for Patients Receiving Chemotherapy  Today you received the following chemotherapy agents: bortezomib.  To help prevent nausea and vomiting after your treatment, we encourage you to take your nausea medication as directed.   If you develop nausea and vomiting that is not controlled by your nausea medication, call the clinic.   BELOW ARE SYMPTOMS THAT SHOULD BE REPORTED IMMEDIATELY:  *FEVER GREATER THAN 100.5 F  *CHILLS WITH OR WITHOUT FEVER  NAUSEA AND VOMITING THAT IS NOT CONTROLLED WITH YOUR NAUSEA MEDICATION  *UNUSUAL SHORTNESS OF BREATH  *UNUSUAL BRUISING OR BLEEDING  TENDERNESS IN MOUTH AND THROAT WITH OR WITHOUT PRESENCE OF ULCERS  *URINARY PROBLEMS  *BOWEL PROBLEMS  UNUSUAL RASH Items with * indicate a potential emergency and should be followed up as soon as possible.  Feel free to call the clinic should you have any questions or concerns. The clinic phone number is [phone].  Please show the CHEMO ALERT CARD at check-in to the Emergency Department and triage nurse.   

## 2020-08-20 ENCOUNTER — Telehealth: Payer: Self-pay | Admitting: Hematology

## 2020-08-20 NOTE — Telephone Encounter (Signed)
Scheduled per 02/21 los, patient has been called and notified. °

## 2020-08-22 ENCOUNTER — Inpatient Hospital Stay: Payer: Medicare Other

## 2020-08-22 ENCOUNTER — Telehealth: Payer: Self-pay | Admitting: Hematology

## 2020-08-22 ENCOUNTER — Other Ambulatory Visit: Payer: Self-pay

## 2020-08-22 VITALS — BP 122/56 | HR 90 | Temp 98.4°F | Resp 18

## 2020-08-22 DIAGNOSIS — Z7189 Other specified counseling: Secondary | ICD-10-CM

## 2020-08-22 DIAGNOSIS — C9000 Multiple myeloma not having achieved remission: Secondary | ICD-10-CM

## 2020-08-22 MED ORDER — PROCHLORPERAZINE MALEATE 10 MG PO TABS
10.0000 mg | ORAL_TABLET | Freq: Once | ORAL | Status: AC
  Administered 2020-08-22: 10 mg via ORAL

## 2020-08-22 MED ORDER — PROCHLORPERAZINE MALEATE 10 MG PO TABS
ORAL_TABLET | ORAL | Status: AC
  Filled 2020-08-22: qty 1

## 2020-08-22 MED ORDER — BORTEZOMIB CHEMO SQ INJECTION 3.5 MG (2.5MG/ML)
1.3000 mg/m2 | Freq: Once | INTRAMUSCULAR | Status: AC
  Administered 2020-08-22: 2.25 mg via SUBCUTANEOUS
  Filled 2020-08-22: qty 0.9

## 2020-08-22 NOTE — Telephone Encounter (Signed)
Called per 2/23 sch msg - no answer. Left message for pt to call back to reschedule.

## 2020-08-22 NOTE — Patient Instructions (Signed)
[facility] Discharge Instructions for Patients Receiving Chemotherapy  Today you received the following chemotherapy agents Velcade.  To help prevent nausea and vomiting after your treatment, we encourage you to take your nausea medication as directed.  If you develop nausea and vomiting that is not controlled by your nausea medication, call the clinic.   BELOW ARE SYMPTOMS THAT SHOULD BE REPORTED IMMEDIATELY:  *FEVER GREATER THAN 100.5 F  *CHILLS WITH OR WITHOUT FEVER  NAUSEA AND VOMITING THAT IS NOT CONTROLLED WITH YOUR NAUSEA MEDICATION  *UNUSUAL SHORTNESS OF BREATH  *UNUSUAL BRUISING OR BLEEDING  TENDERNESS IN MOUTH AND THROAT WITH OR WITHOUT PRESENCE OF ULCERS  *URINARY PROBLEMS  *BOWEL PROBLEMS  UNUSUAL RASH Items with * indicate a potential emergency and should be followed up as soon as possible.  Feel free to call the clinic should you have any questions or concerns. The clinic phone number is [phone].  Please show the CHEMO ALERT CARD at check-in to the Emergency Department and triage nurse.   

## 2020-08-24 ENCOUNTER — Other Ambulatory Visit: Payer: Self-pay | Admitting: Hematology

## 2020-08-24 DIAGNOSIS — C9000 Multiple myeloma not having achieved remission: Secondary | ICD-10-CM

## 2020-08-25 ENCOUNTER — Encounter: Payer: Self-pay | Admitting: Hematology

## 2020-08-26 ENCOUNTER — Telehealth: Payer: Self-pay | Admitting: *Deleted

## 2020-08-26 ENCOUNTER — Inpatient Hospital Stay: Payer: Medicare Other

## 2020-08-26 ENCOUNTER — Other Ambulatory Visit: Payer: Self-pay

## 2020-08-26 DIAGNOSIS — C9000 Multiple myeloma not having achieved remission: Secondary | ICD-10-CM

## 2020-08-26 DIAGNOSIS — Z5111 Encounter for antineoplastic chemotherapy: Secondary | ICD-10-CM

## 2020-08-26 LAB — CBC WITH DIFFERENTIAL/PLATELET
Abs Immature Granulocytes: 0.03 10*3/uL (ref 0.00–0.07)
Basophils Absolute: 0.1 10*3/uL (ref 0.0–0.1)
Basophils Relative: 2 %
Eosinophils Absolute: 0.1 10*3/uL (ref 0.0–0.5)
Eosinophils Relative: 5 %
HCT: 36.6 % (ref 36.0–46.0)
Hemoglobin: 11.9 g/dL — ABNORMAL LOW (ref 12.0–15.0)
Immature Granulocytes: 1 %
Lymphocytes Relative: 25 %
Lymphs Abs: 0.7 10*3/uL (ref 0.7–4.0)
MCH: 29.9 pg (ref 26.0–34.0)
MCHC: 32.5 g/dL (ref 30.0–36.0)
MCV: 92 fL (ref 80.0–100.0)
Monocytes Absolute: 0.4 10*3/uL (ref 0.1–1.0)
Monocytes Relative: 16 %
Neutro Abs: 1.3 10*3/uL — ABNORMAL LOW (ref 1.7–7.7)
Neutrophils Relative %: 51 %
Platelets: 120 10*3/uL — ABNORMAL LOW (ref 150–400)
RBC: 3.98 MIL/uL (ref 3.87–5.11)
RDW: 15.3 % (ref 11.5–15.5)
WBC: 2.6 10*3/uL — ABNORMAL LOW (ref 4.0–10.5)
nRBC: 0 % (ref 0.0–0.2)

## 2020-08-26 LAB — CMP (CANCER CENTER ONLY)
ALT: 7 U/L (ref 0–44)
AST: 11 U/L — ABNORMAL LOW (ref 15–41)
Albumin: 3.2 g/dL — ABNORMAL LOW (ref 3.5–5.0)
Alkaline Phosphatase: 102 U/L (ref 38–126)
Anion gap: 8 (ref 5–15)
BUN: 9 mg/dL (ref 8–23)
CO2: 23 mmol/L (ref 22–32)
Calcium: 8.2 mg/dL — ABNORMAL LOW (ref 8.9–10.3)
Chloride: 106 mmol/L (ref 98–111)
Creatinine: 0.83 mg/dL (ref 0.44–1.00)
GFR, Estimated: >60 mL/min (ref >60)
Glucose, Bld: 113 mg/dL — ABNORMAL HIGH (ref 70–99)
Potassium: 3.8 mmol/L (ref 3.5–5.1)
Sodium: 137 mmol/L (ref 135–145)
Total Bilirubin: 0.4 mg/dL (ref 0.3–1.2)
Total Protein: 5.7 g/dL — ABNORMAL LOW (ref 6.5–8.1)

## 2020-08-26 NOTE — Telephone Encounter (Signed)
Wait to Refill - Earliest date to obtain 08/28/20 - [employer] Auth# needed

## 2020-08-26 NOTE — Telephone Encounter (Signed)
Patient called: Drip down back of throat from sinuses getting worse. Fills her stomach. Nauseated. Feels very full from it - she she's lost her appetite and losing weight. Wants to know if Dr.Kale can prescribe something to dry up her sinuses. Dr.Kale informed - his response " Ask if she has been using steam, OTC antihistamine and sterile saline spray as recommended?" Contacted patient at number she provided to inquire per Dr.Kale's request. Call dropped/hung up x 2 and then LVM asking for return call to general office number.

## 2020-08-27 ENCOUNTER — Other Ambulatory Visit: Payer: Self-pay

## 2020-08-27 DIAGNOSIS — R110 Nausea: Secondary | ICD-10-CM

## 2020-08-27 MED ORDER — ONDANSETRON HCL 8 MG PO TABS: 8.0000 mg | ORAL_TABLET | Freq: Three times a day (TID) | ORAL | 0 refills | Status: AC | PRN

## 2020-08-28 ENCOUNTER — Other Ambulatory Visit: Payer: Self-pay

## 2020-08-28 ENCOUNTER — Other Ambulatory Visit: Payer: Self-pay | Admitting: *Deleted

## 2020-08-28 ENCOUNTER — Other Ambulatory Visit: Payer: Self-pay | Admitting: Hematology

## 2020-08-28 ENCOUNTER — Other Ambulatory Visit: Payer: Medicare Other | Admitting: Hospice

## 2020-08-28 DIAGNOSIS — C9000 Multiple myeloma not having achieved remission: Secondary | ICD-10-CM

## 2020-08-28 DIAGNOSIS — Z515 Encounter for palliative care: Secondary | ICD-10-CM

## 2020-08-28 DIAGNOSIS — R52 Pain, unspecified: Secondary | ICD-10-CM

## 2020-08-28 DIAGNOSIS — J302 Other seasonal allergic rhinitis: Secondary | ICD-10-CM

## 2020-08-28 MED ORDER — PROCHLORPERAZINE MALEATE 10 MG PO TABS: 10.0000 mg | ORAL_TABLET | Freq: Four times a day (QID) | ORAL | 0 refills | Status: AC | PRN

## 2020-08-28 MED ORDER — LENALIDOMIDE 15 MG PO CAPS: ORAL_CAPSULE | ORAL | 0 refills | Status: DC

## 2020-08-28 NOTE — Progress Notes (Signed)
[facility] Consult Note Telephone: [phone]  Fax: [phone]  PATIENT NAME: Lisa Lester DOB: 04-Feb-1946 MRN: [id number]  PRIMARY CARE PROVIDER:   [name], NP [name], NP [place],  [place] [address]  REFERRING PROVIDER: [name], NP [name], NP [place],  [place] [address]  RESPONSIBLE PARTY:Self [id number] HCPOA/Extended Emergency Contact Information Primary Emergency Contact: greer,doug Mobile Phone: [phone] Relation: Nephew Preferred language: English Interpreter needed? No Secondary Emergency Contact: greer,julie Mobile Phone: [phone] Relation: Niece Preferred language: English Interpreter needed? No [name] is a Therapist, sports   Visit is to build trust and highlight Palliative Medicine as specialized medical care for people living with serious illness, aimed at facilitating better quality of life through symptoms relief, assisting with advance care plan and establishing goals of care.   CHIEF COMPLAINT: Palliative focused visit/allergic rhinitis  RECOMMENDATIONS/PLAN:   1. Advance Care Planning/Code Status: Patient is a Full code. Not open to further discuss advance care planning because she feels she is not ready to talk about that and may take away her resolve to fight for her health. Therapeutic listening provided. Will reapproach in the future.    2. Goals of Care: Goals of care include to maximize quality of life and symptom management.  Visit consisted of counseling and education dealing with the complex and emotionally intense issues of symptom management and palliative care in the setting of serious and potentially life-threatening illness. Palliative care team will continue to support patient, patient's family, and medical team.  I spent 20  minutes providing this consultation. More than 50% of the time in this consultation was spent on coordinating  communication.  -------------------------------------------------------------------------------------------------------------------------------------------------- 3. Symptom management/Plan:  Allergic rhinitis: Identify and avoid irritant. Initiate Cetirizine 80m daily. Called [facility]  And then [place] PCP's off with recommendation.  Nausea: Pt receiving ordered Zofran today. Education on intent, rout, dose and side effects.  Palliative will continue to monitor for symptom management/decline and make recommendations as needed. Return 2 months or prn. Encouraged to call provider sooner with any concerns.   HISTORY OF PRESENT ILLNESS:  Lisa HAKEEMis a 75y.o. female with multiple medical problems including allergy which is seasonal;  with recent flare since last week  - runny nose and post nasal drip, no fever no chills, no cough; she believes the post nasal drip goes to her stomach and contributes to her nausea. She continues on chemotherapy regularly, bone marrow biopsy and CT scan expected in a few weeks per patient. History of multiple myeloma, generalized body pain, anxiety, depression. History obtained from review of EMR, discussion with patient/family. Review and summarization of Epic records shows history from other than patient. Rest of 10 point ROS asked and negative. Palliative Care was asked to follow this patient by consultation request of MMaximiano Coss NP to help address complex decision making in the context of goals of care.   CODE STATUS: Full  PPS: 60%  HOSPICE ELIGIBILITY/DIAGNOSIS: TBD  PAST MEDICAL HISTORY:  Past Medical History:  Diagnosis Date  . Multiple myeloma ([place]   . Pathologic fracture    L3  . [place]asmac[place]oma ([place]     SOCIAL HX: '@SOCX'  Patient at home  for ongoing care.   FAMILY HX:  Family History  Problem Relation Age of Onset  . Cancer - Other Mother   . Cancer - Other Father     Review lab tests/diagnostics Recent Labs  Lab 08/26/20 1513  WBC 2.6*  HGB 11.9*  HCT 36.6  PLT 120*  MCV 92.0   Recent Labs  Lab 08/26/20 1513  NA 137  K 3.8  CL 106  CO2 23  BUN 9  CREATININE 0.83  GLUCOSE 113*   Latest GFR by Cockcroft Gault (not valid in AKI or ESRD) Estimated Creatinine Clearance: 55.1 mL/min (by C-G formula based on SCr of 0.83 mg/dL). Recent Labs  Lab 08/26/20 1513  AST 11*  ALT 7  ALKPHOS 102   No components found for: ALB No results for input(s): APTT, INR in the last 168 hours.  Invalid input(s): PTPATIENT No results for input(s): BNP, PROBNP in the last 168 hours.  ALLERGIES: No Known Allergies    PERTINENT MEDICATIONS:  Outpatient Encounter Medications as of 08/28/2020  Medication Sig  . acyclovir (ZOVIRAX) 400 MG tablet Take 400 mg by mouth 2 (two) times daily.  Marland Kitchen ALPRAZolam (XANAX) 0.5 MG tablet Take 1 tablet (0.5 mg total) by mouth 3 (three) times daily as needed for anxiety.  Marland Kitchen aspirin EC 81 MG tablet Take 81 mg by mouth daily. Swallow whole.  . Cyanocobalamin (B-12) 1000 MCG SUBL Place 1,000 mcg under the tongue daily.  . cyclobenzaprine (FLEXERIL) 5 MG tablet Take 1 tablet (5 mg total) by mouth 3 (three) times daily as needed for muscle spasms.  . DULoxetine (CYMBALTA) 30 MG capsule TAKE 1 CAPSULE BY MOUTH EVERY DAY  . ergocalciferol (VITAMIN D2) 1.25 MG (50000 UT) capsule Take 1 capsule (50,000 Units total) by mouth 2 (two) times a week.  . fentaNYL (DURAGESIC) 25 MCG/HR Place 1 patch onto the skin every 3 (three) days.  Marland Kitchen lenalidomide (REVLIMID) 15 MG capsule TAKE 1 CAPSULE BY MOUTH  DAILY FOR 14 DAYS, THEN 7  DAYS OFF  . lidocaine (LIDODERM) 5 % Place 1 patch onto the skin daily. Remove & Discard patch within 12 hours or as directed by MD on the neck  . meloxicam (MOBIC) 15 MG tablet Take 15 mg by mouth daily.  Marland Kitchen nystatin (MYCOSTATIN) 100000 UNIT/ML suspension Take 5 mLs (500,000 Units total) by mouth 4 (four) times daily.  . ondansetron (ZOFRAN) 8 MG tablet Take 1 tablet (8 mg total) by  mouth every 8 (eight) hours as needed for nausea or vomiting.  Marland Kitchen oxyCODONE-acetaminophen (PERCOCET/ROXICET) 5-325 MG tablet Take 1 tablet by mouth every 6 (six) hours as needed for moderate pain or severe pain.  . pantoprazole (PROTONIX) 40 MG tablet Take 1 tablet (40 mg total) by mouth daily.  . polyethylene glycol (MIRALAX) 17 g packet Take 17 g by mouth daily. (Patient taking differently: Take 17 g by mouth daily as needed for mild constipation.)  . prochlorperazine (COMPAZINE) 10 MG tablet Take 10 mg by mouth every 6 (six) hours as needed for nausea or vomiting.  . senna-docusate (SENNA S) 8.6-50 MG tablet Take 2 tablets by mouth 2 (two) times daily.   No facility-administered encounter medications on file as of 08/28/2020.    ROS  General: NAD EYES: denies vision changes ENMT: denies dysphagia no xerostomia, occasional runny dose and post nasal drip, watery eyes Cardiovascular: denies chest pain Pulmonary: denies  cough, denies SOB  Abdomen: endorses fair appetite, no constipation or diarrhea GU: denies dysuria or urinary frquency MSK:  ambulatory, no falls reported Skin: denies rashes or wounds Neurological: endorses weakness, denies pain, denies insomnia Psych: Endorses positive mood Heme/lymph/immuno: denies bruises, abnormal bleeding   PHYSICAL EXAM  Height:5 feet 5 inchesWeight: 142Lbs down from 155Ibs in  Jan 2022 BP 122/84 P 90 T 98.2 F 02 99% R 18 General: In no acute distress,  Cardiovascular: regular rate and rhythm; no edema in BLE Pulmonary: no cough, no increased work of breathing, normal respiratory effort Abdomen: soft, non tender, positive bowel sounds in all quadrants GU:  no suprapubic tenderness Eyes: Normal lids, no discharge, sclera anicteric ENMT: Moist mucous membranes Musculoskeletal:  no joint deformity, ambulatory without assistive device Skin: no rash to visible skin, warm without cyanosis Psych: non-anxious affect Neurological: Weakness but  otherwise non focal Heme/lymph/immuno: no bruises, no bleeding  Thank you for the opportunity to participate in the care of AZALEA CEDAR Please call our office at [phone] if we can be of additional assistance.  Note: Portions of this note were generated with Lobbyist. Dictation errors may occur despite best attempts at proofreading.  Teodoro Spray, NP

## 2020-08-29 ENCOUNTER — Inpatient Hospital Stay: Payer: Medicare Other | Attending: Hematology

## 2020-08-29 ENCOUNTER — Other Ambulatory Visit: Payer: Self-pay

## 2020-08-29 VITALS — BP 138/83 | HR 98 | Resp 18

## 2020-08-29 DIAGNOSIS — Z79899 Other long term (current) drug therapy: Secondary | ICD-10-CM | POA: Insufficient documentation

## 2020-08-29 DIAGNOSIS — K5900 Constipation, unspecified: Secondary | ICD-10-CM | POA: Diagnosis not present

## 2020-08-29 DIAGNOSIS — Z5111 Encounter for antineoplastic chemotherapy: Secondary | ICD-10-CM | POA: Insufficient documentation

## 2020-08-29 DIAGNOSIS — E538 Deficiency of other specified B group vitamins: Secondary | ICD-10-CM | POA: Diagnosis not present

## 2020-08-29 DIAGNOSIS — Z923 Personal history of irradiation: Secondary | ICD-10-CM | POA: Insufficient documentation

## 2020-08-29 DIAGNOSIS — C9000 Multiple myeloma not having achieved remission: Secondary | ICD-10-CM | POA: Insufficient documentation

## 2020-08-29 DIAGNOSIS — Z7982 Long term (current) use of aspirin: Secondary | ICD-10-CM | POA: Diagnosis not present

## 2020-08-29 DIAGNOSIS — M542 Cervicalgia: Secondary | ICD-10-CM | POA: Insufficient documentation

## 2020-08-29 DIAGNOSIS — Z7189 Other specified counseling: Secondary | ICD-10-CM

## 2020-08-29 MED ORDER — CYANOCOBALAMIN 1000 MCG/ML IJ SOLN
INTRAMUSCULAR | Status: AC
  Filled 2020-08-29: qty 1

## 2020-08-29 MED ORDER — PROCHLORPERAZINE MALEATE 10 MG PO TABS
ORAL_TABLET | ORAL | Status: AC
  Filled 2020-08-29: qty 1

## 2020-08-29 MED ORDER — CYANOCOBALAMIN 1000 MCG/ML IJ SOLN
1000.0000 ug | Freq: Once | INTRAMUSCULAR | Status: AC
  Administered 2020-08-29: 1000 ug via SUBCUTANEOUS

## 2020-08-29 MED ORDER — PROCHLORPERAZINE MALEATE 10 MG PO TABS
10.0000 mg | ORAL_TABLET | Freq: Once | ORAL | Status: AC
  Administered 2020-08-29: 10 mg via ORAL

## 2020-08-29 MED ORDER — BORTEZOMIB CHEMO SQ INJECTION 3.5 MG (2.5MG/ML)
1.3000 mg/m2 | Freq: Once | INTRAMUSCULAR | Status: AC
  Administered 2020-08-29: 2.25 mg via SUBCUTANEOUS
  Filled 2020-08-29: qty 0.9

## 2020-08-29 NOTE — Progress Notes (Signed)
Verbal order per Dr. Irene Limbo - Madaline Brilliant to receive treatment today with ANC 1.3 (08/26/20 lab results)

## 2020-08-29 NOTE — Patient Instructions (Signed)
[facility] Discharge Instructions for Patients Receiving Chemotherapy  Today you received the following chemotherapy agents Velcade To help prevent nausea and vomiting after your treatment, we encourage you to take your nausea medication as prescribed.   If you develop nausea and vomiting that is not controlled by your nausea medication, call the clinic.   BELOW ARE SYMPTOMS THAT SHOULD BE REPORTED IMMEDIATELY:  *FEVER GREATER THAN 100.5 F  *CHILLS WITH OR WITHOUT FEVER  NAUSEA AND VOMITING THAT IS NOT CONTROLLED WITH YOUR NAUSEA MEDICATION  *UNUSUAL SHORTNESS OF BREATH  *UNUSUAL BRUISING OR BLEEDING  TENDERNESS IN MOUTH AND THROAT WITH OR WITHOUT PRESENCE OF ULCERS  *URINARY PROBLEMS  *BOWEL PROBLEMS  UNUSUAL RASH Items with * indicate a potential emergency and should be followed up as soon as possible.  Feel free to call the clinic should you have any questions or concerns. The clinic phone number is [phone].  Please show the CHEMO ALERT CARD at check-in to the Emergency Department and triage nurse.   

## 2020-08-30 ENCOUNTER — Encounter: Payer: Self-pay | Admitting: Hematology

## 2020-08-30 ENCOUNTER — Other Ambulatory Visit: Payer: Self-pay | Admitting: *Deleted

## 2020-08-30 DIAGNOSIS — C9000 Multiple myeloma not having achieved remission: Secondary | ICD-10-CM

## 2020-08-30 MED ORDER — OXYCODONE-ACETAMINOPHEN 5-325 MG PO TABS: 1.0000 | ORAL_TABLET | Freq: Four times a day (QID) | ORAL | 0 refills | Status: DC | PRN

## 2020-09-05 ENCOUNTER — Telehealth: Payer: Self-pay | Admitting: Hematology

## 2020-09-05 NOTE — Telephone Encounter (Signed)
Called to confirm appt on 08/1456 - pt niece is aware

## 2020-09-09 ENCOUNTER — Ambulatory Visit: Payer: Medicare Other

## 2020-09-09 ENCOUNTER — Ambulatory Visit: Payer: Medicare Other | Admitting: Hematology

## 2020-09-09 ENCOUNTER — Other Ambulatory Visit: Payer: Medicare Other

## 2020-09-09 NOTE — Progress Notes (Signed)
HEMATOLOGY/ONCOLOGY CONSULTATION NOTE  Date of Service: 09/10/2020  Patient Care Team: [name], NP as PCP - General (Adult Health Nurse Practitioner)  CHIEF COMPLAINTS/PURPOSE OF CONSULTATION:  mx of myeloma  HISTORY OF PRESENTING ILLNESS:  Lisa Lester is a wonderful 75 y.o. female who has been referred to [place] by Dr. 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 Pt is accompanied today by her nephew. The pt reports that she is doing well overall.   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 To treat this the pt had a Kyphoplasty and radiation therapy. Pt also had a BM Bx, urinalysis, and a bone scan completed at the time. There was no official diagnosis of MGUS or Multiple Myeloma at this time, but they continued to monitor.   A few weeks ago ptt began having significant pain in her chest, which was thought to be a fractured sternum. Pt describes the pain as "flesh being ripped off of her bones". She was also having left-sided neck and back discomfort at this time. Pt was taken to the hospital where her pain was treated and she received a right Iliac Bx, BM Bx, and CT Chest while admitted. They did not complete any whole body imaging at this time.   She had a root canal nearly 1.5 months ago. Pt discontinued taking Thyroid replacement 5-6 years ago. Pt is also no longer taking any HTN medications. She was asked to restart Gabapentin yesterday, which she was given for pain control, but felt ill after taking it. She continues taking Xanax and Hydrocodone every 4 hours. Pt reports some constipation and takes Colace occasionally.   Pt had COVID19 in July of 2020. Her husband also had the virus which caused his passing. Pt has received two doses of the COVID19 vaccine but has not received the booster.  Of note since the patient's last visit, pt has had CT Chest ([id number]) completed on 04/09/2020  with results revealing "1. Chronic posterior surgical fusion of L2 and L4 with bilateral transpedicular screws and bridging rods. Hardware intact appearing. 2. Status post vertebroplasty of L3. There is an underlying lytic lesion at the L3 vertebral body which extends into its left pedicle. These findings correlate with the provided history of a known plasmacytoma. 3. There are numerous lytic skeletal lesions at the chest, abdomen and pelvis, more numerous at the spine and ribs. 4. Multiple lytic lesions are present at the sternum, with interval development of a pathologic fracture at the sternum without significant displacement. 5. Stable mild superior endplate compression deformity at T11. 6. 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 Similar findings were present on prior study from 03/30/12, the degree of biliary ductal dilation has worsened as compared to the prior study. No choledochal stone identified.   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  Most recent lab results (04/11/2020) of CBC w/diff is as follows: all values are WNL except for Lymphs Rel  at 18.5. 04/10/2020 CMP is as follows: all values are WNL except for CO2 at 33, Glucose at 120, Calcium at 11.6, Anion gap at 3.  On review of systems, pt reports constipation, chest pain, neck pain, back pain and denies dysuria and any other symptoms.   On PMHx the pt reports Kyphoplasty,  Plasmacytoma, HTN, Thyroid disorder. On Social Hx the pt reports that she is a non-smoker and only drinks alcohol socially.   INTERVAL HISTORY:  Lisa Lester is a wonderful 75 y.o. 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 The patient's last visit with [place] was on 08/19/2020. The pt reports that she is doing well overall. She is here for C6D1 VRD chemotherapy.   The pt reports that there was a scheduling issue up with the last treatment for C5D11 and missed that treatment. She notes that she came and got labs done, but not the infusion due to this error. The pt notes that sometimes when she is sleeping she irritates the spot on her neck. She still wears the neck brace for the most of the time during the day, as needed. The pain is much improved since it was before though. The pt notes her taste buds and appetite are improving. She is trying to control the post-nasal drip.  Lab results today 09/10/2020 of CBC w/diff and CMP is as follows: all values are WNL except for WBC of 3.2K, RBC of 3.79, Hgb of 11.8, HCT of 35.6, RDW of 16.6, Glucose of 107, Calcium of 8.4, Total Protein of 5.9, Albumin of 3.4, AST of 9.  On review of systems, pt reports minor fluid retention and denies decreased appetite, headaches, tender spots on head, leg swelling, SOB, chest pain, tingling/numbness in hands/feet, abdominal pain, back pain, and any other symptoms.  MEDICAL HISTORY:  Past Medical History:  Diagnosis Date  . Multiple myeloma ([place])   . Pathologic fracture    L3  . Plasmacytoma ([place])     SURGICAL HISTORY: Past Surgical History:  Procedure Laterality Date  . BACK SURGERY    . BREAST ENHANCEMENT SURGERY Bilateral   . LUMBAR FUSION     L2 - L4, hx of pathologic fx of L3  . RADIOLOGY WITH ANESTHESIA N/A 06/13/2020   Procedure: MRI BRAIN WITHOUT; C-SPINE WITHOUT;  Surgeon: Radiologist, Medication, MD;  Location: [facility];  Service: Radiology;  Laterality: N/A;    SOCIAL  HISTORY: Social History   Socioeconomic History  . Marital status: Widowed    Spouse name: russell Dobias  . Number of children: 0  . Years of education: 93  . Highest education level: 12th grade  Occupational History  . Not on file  Tobacco Use  . Smoking status: Never Smoker  . Smokeless tobacco: Never Used  Vaping Use  . Vaping Use: Never used  Substance and Sexual Activity  . Alcohol use: Never  . Drug use: Never  . Sexual activity: Not Currently  Other Topics Concern  . Not on file  Social History Narrative  . Not on file   Social Determinants of Health   Financial Resource Strain: Not on file  Food Insecurity: Not on file  Transportation Needs: Not on file  Physical Activity: Not on file  Stress: Not on file  Social Connections: Not on file  Intimate Partner Violence: Not on file    FAMILY HISTORY: Family History  Problem Relation Age of Onset  . Cancer - Other Mother   . Cancer - Other  Father     ALLERGIES:  has No Known Allergies.  MEDICATIONS:  Current Outpatient Medications  Medication Sig Dispense Refill  . acyclovir (ZOVIRAX) 400 MG tablet Take 400 mg by mouth 2 (two) times daily.    Marland Kitchen ALPRAZolam (XANAX) 0.5 MG tablet Take 1 tablet (0.5 mg total) by mouth 3 (three) times daily as needed for anxiety. 15 tablet 0  . aspirin EC 81 MG tablet Take 81 mg by mouth daily. Swallow whole.    . Cyanocobalamin (B-12) 1000 MCG SUBL Place 1,000 mcg under the tongue daily. 60 tablet 2  . cyclobenzaprine (FLEXERIL) 5 MG tablet Take 1 tablet (5 mg total) by mouth 3 (three) times daily as needed for muscle spasms. 30 tablet 0  . DULoxetine (CYMBALTA) 30 MG capsule TAKE 1 CAPSULE BY MOUTH EVERY DAY 90 capsule 2  . ergocalciferol (VITAMIN D2) 1.25 MG (50000 UT) capsule Take 1 capsule (50,000 Units total) by mouth 2 (two) times a week. 16 capsule 2  . fentaNYL (DURAGESIC) 25 MCG/HR Place 1 patch onto the skin every 3 (three) days. 10 patch 0  . lenalidomide (REVLIMID) 15  MG capsule TAKE 1 CAPSULE BY MOUTH  DAILY FOR 14 DAYS, THEN 7  DAYS OFF 14 capsule 0  . lidocaine (LIDODERM) 5 % Place 1 patch onto the skin daily. Remove & Discard patch within 12 hours or as directed by MD on the neck 30 patch 5  . meloxicam (MOBIC) 15 MG tablet Take 15 mg by mouth daily.    Marland Kitchen nystatin (MYCOSTATIN) 100000 UNIT/ML suspension Take 5 mLs (500,000 Units total) by mouth 4 (four) times daily. 200 mL 0  . ondansetron (ZOFRAN) 8 MG tablet Take 1 tablet (8 mg total) by mouth every 8 (eight) hours as needed for nausea or vomiting. 20 tablet 0  . oxyCODONE-acetaminophen (PERCOCET/ROXICET) 5-325 MG tablet Take 1 tablet by mouth every 6 (six) hours as needed for moderate pain or severe pain. 90 tablet 0  . pantoprazole (PROTONIX) 40 MG tablet Take 1 tablet (40 mg total) by mouth daily. 30 tablet 1  . polyethylene glycol (MIRALAX) 17 g packet Take 17 g by mouth daily. (Patient taking differently: Take 17 g by mouth daily as needed for mild constipation.) 30 each 1  . prochlorperazine (COMPAZINE) 10 MG tablet Take 1 tablet (10 mg total) by mouth every 6 (six) hours as needed for nausea or vomiting. 30 tablet 0  . senna-docusate (SENNA S) 8.6-50 MG tablet Take 2 tablets by mouth 2 (two) times daily. 60 tablet 2   No current facility-administered medications for this visit.    REVIEW OF SYSTEMS:   10 Point review of Systems was done is negative except as noted above.  PHYSICAL EXAMINATION: ECOG PERFORMANCE STATUS: 2 - Symptomatic, <50% confined to bed  . Vitals:   09/10/20 1401  BP: 136/84  Pulse: 87  Resp: 18  Temp: (!) 97.4 F (36.3 C)  SpO2: 99%   Filed Weights   09/10/20 1401  Weight: 141 lb 11.2 oz (64.3 kg)   .Body mass index is 23.58 kg/m.  Exam was given in a wheelchair.   GENERAL:alert, in no acute distress and comfortable SKIN: no acute rashes, no significant lesions EYES: conjunctiva are pink and non-injected, sclera anicteric OROPHARYNX: MMM, no exudates, no  oropharyngeal erythema or ulceration NECK: supple, no JVD LYMPH:  no palpable lymphadenopathy in the cervical, axillary or inguinal regions LUNGS: clear to auscultation b/l with normal respiratory effort HEART: regular rate & rhythm  ABDOMEN:  normoactive bowel sounds , non tender, not distended. Extremity: no pedal edema PSYCH: alert & oriented x 3 with fluent speech NEURO: no focal motor/sensory deficits  LABORATORY DATA:  I have reviewed the data as listed  . CBC Latest Ref Rng & Units 09/10/2020 08/26/2020 08/19/2020  WBC 4.0 - 10.5 K/uL 3.2(L) 2.6(L) 3.2(L)  Hemoglobin 12.0 - 15.0 g/dL 11.8(L) 11.9(L) 11.4(L)  Hematocrit 36.0 - 46.0 % 35.6(L) 36.6 34.9(L)  Platelets 150 - 400 K/uL 232 120(L) 198    . CMP Latest Ref Rng & Units 09/10/2020 08/26/2020 08/19/2020  Glucose 70 - 99 mg/dL 107(H) 113(H) 93  BUN 8 - 23 mg/dL _0 Creatinine 0.44 - 1.00 mg/dL 0.77 0.83 0.79  Sodium 135 - 145 mmol/L 139 137 140  Potassium 3.5 - 5.1 mmol/L 5.0 3.8 3.6  Chloride 98 - 111 mmol/L 106 106 106  CO2 22 - 32 mmol/L _1 Calcium 8.9 - 10.3 mg/dL 8.4(L) 8.2(L) 7.9(L)  Total Protein 6.5 - 8.1 g/dL 5.9(L) 5.7(L) 5.5(L)  Total Bilirubin 0.3 - 1.2 mg/dL 0.3 0.4 0.3  Alkaline Phos 38 - 126 U/L 82 102 108  AST 15 - 41 U/L 9(L) 11(L) 9(L)  ALT 0 - 44 U/L _2 Component     Latest Ref Rng & Units 07/29/2020  IgG (Immunoglobin G), Serum     586 - 1,602 mg/dL 687  IgA     64 - 422 mg/dL 27 (L)  IgM (Immunoglobulin M), Srm     26 - 217 mg/dL 35  Total Protein ELP     6.0 - 8.5 g/dL 5.7 (L)  Albumin SerPl Elph-Mcnc     2.9 - 4.4 g/dL 3.4  Alpha 1     0.0 - 0.4 g/dL 0.3  Alpha2 Glob SerPl Elph-Mcnc     0.4 - 1.0 g/dL 0.8  B-Globulin SerPl Elph-Mcnc     0.7 - 1.3 g/dL 0.7  Gamma Glob SerPl Elph-Mcnc     0.4 - 1.8 g/dL 0.6  M Protein SerPl Elph-Mcnc     Not Observed g/dL 0.3 (H)  Globulin, Total     2.2 - 3.9 g/dL 2.3  Albumin/Glob SerPl     0.7 - 1.7 1.5  IFE 1      Comment (A)   Please Note (HCV):      Comment  Kappa free light chain     3.3 - 19.4 mg/L 21.0 (H)  Lamda free light chains     5.7 - 26.3 mg/L 14.7  Kappa, lamda light chain ratio     0.26 - 1.65 1.43    RADIOGRAPHIC STUDIES: I have personally reviewed the radiological images as listed and agreed with the findings in the report. No results found.  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  Previous h/o plasmacytomas 05/15/2020 M Protein at 0.7 g/dL 05/24/2020 PET/CT ([id number]) revealed "1. There are multiple hypermetabolic lytic osseous lesions consistent with multiple myeloma. The most prominent lesion is within the left lateral mass of C1 and this likely involves the foramen transversarium. Probable T3 and sternal pathologic fractures."  2) C1 myeloma lesion status post palliative radiation with improvement in pain  PLAN: -Discussed pt labwork today, 09/10/2020; blood counts stable, and chemistries stable. -Advised pt that this is the last planned cycle. Will repeat the Myeloma labs at next visit.  -Discussed  next step of switching to maintenance treatment following getting a PET scan and Bm Bx. If there is 90% reduction in plasma cells, then we will switch to just Revlimid.  -Advised pt that after we transition to Revlimid, then she can be able to move back home to [facility]. -Discussed goal is to get as deep response as possible as we have with this treatment and then transition to maintenance for longest progression free period. -Advised pt that the pain in neck could be due to reactive nature and recovery. Positional due to stiffness. -The pt has no prohibitive toxicities from continuing C6D1 VRD.  -Continue [place] Vitamin B12 on D1 and D8 of each treatment. -Recommended that the pt continue to eat well, drink at least 48-64 oz of water each day, and walk 20-30 minutes each day.  -Continue 1000  mcg Vitamin B12 & one B-complex daily.  -Continue daily baby ASA and Acyclovir BID. -Continue Zometa q4weeks. -Will get PET on 03/17 and Bm Bx on 03/22 as scheduled.  -Will connect pt with Dr. Otho Ket at [facility] in [place] see back in 2 weeks.    FOLLOW UP: F/u for C6 of Treatment as currently scheduled F/u fro PET/CT and BM Bx as scheduled RTC with Dr Irene Limbo with labs in 2 weeks    The total time spent in the appointment was 30 minutes and more than 50% was on counseling and direct patient cares.    All of the patient's questions were answered with apparent satisfaction. The patient knows to call the clinic with any problems, questions or concerns.    Sullivan Lone MD [place] AAHIVMS [facility] Hematology/Oncology Physician [facility]  (Office):       [phone] (Work cell):  [phone] (Fax):           [phone]  09/10/2020 2:53 PM   I, Reinaldo Raddle, am acting as scribe for Dr. Sullivan Lone, MD.

## 2020-09-10 ENCOUNTER — Inpatient Hospital Stay (HOSPITAL_BASED_OUTPATIENT_CLINIC_OR_DEPARTMENT_OTHER): Payer: Medicare Other | Admitting: Hematology

## 2020-09-10 ENCOUNTER — Inpatient Hospital Stay: Payer: Medicare Other

## 2020-09-10 ENCOUNTER — Other Ambulatory Visit: Payer: Self-pay

## 2020-09-10 VITALS — BP 136/84 | HR 87 | Temp 97.4°F | Resp 18 | Ht 65.0 in | Wt 141.7 lb

## 2020-09-10 DIAGNOSIS — C9000 Multiple myeloma not having achieved remission: Secondary | ICD-10-CM

## 2020-09-10 DIAGNOSIS — Z5111 Encounter for antineoplastic chemotherapy: Secondary | ICD-10-CM

## 2020-09-10 DIAGNOSIS — Z7189 Other specified counseling: Secondary | ICD-10-CM

## 2020-09-10 LAB — CBC WITH DIFFERENTIAL/PLATELET
Abs Immature Granulocytes: 0.01 10*3/uL (ref 0.00–0.07)
Basophils Absolute: 0.1 10*3/uL (ref 0.0–0.1)
Basophils Relative: 2 %
Eosinophils Absolute: 0.3 10*3/uL (ref 0.0–0.5)
Eosinophils Relative: 8 %
HCT: 35.6 % — ABNORMAL LOW (ref 36.0–46.0)
Hemoglobin: 11.8 g/dL — ABNORMAL LOW (ref 12.0–15.0)
Immature Granulocytes: 0 %
Lymphocytes Relative: 24 %
Lymphs Abs: 0.8 10*3/uL (ref 0.7–4.0)
MCH: 31.1 pg (ref 26.0–34.0)
MCHC: 33.1 g/dL (ref 30.0–36.0)
MCV: 93.9 fL (ref 80.0–100.0)
Monocytes Absolute: 0.3 10*3/uL (ref 0.1–1.0)
Monocytes Relative: 8 %
Neutro Abs: 1.9 10*3/uL (ref 1.7–7.7)
Neutrophils Relative %: 58 %
Platelets: 232 10*3/uL (ref 150–400)
RBC: 3.79 MIL/uL — ABNORMAL LOW (ref 3.87–5.11)
RDW: 16.6 % — ABNORMAL HIGH (ref 11.5–15.5)
WBC: 3.2 10*3/uL — ABNORMAL LOW (ref 4.0–10.5)
nRBC: 0 % (ref 0.0–0.2)

## 2020-09-10 LAB — CMP (CANCER CENTER ONLY)
ALT: 7 U/L (ref 0–44)
AST: 9 U/L — ABNORMAL LOW (ref 15–41)
Albumin: 3.4 g/dL — ABNORMAL LOW (ref 3.5–5.0)
Alkaline Phosphatase: 82 U/L (ref 38–126)
Anion gap: 5 (ref 5–15)
BUN: 9 mg/dL (ref 8–23)
CO2: 28 mmol/L (ref 22–32)
Calcium: 8.4 mg/dL — ABNORMAL LOW (ref 8.9–10.3)
Chloride: 106 mmol/L (ref 98–111)
Creatinine: 0.77 mg/dL (ref 0.44–1.00)
GFR, Estimated: >60 mL/min
Glucose, Bld: 107 mg/dL — ABNORMAL HIGH (ref 70–99)
Potassium: 5 mmol/L (ref 3.5–5.1)
Sodium: 139 mmol/L (ref 135–145)
Total Bilirubin: 0.3 mg/dL (ref 0.3–1.2)
Total Protein: 5.9 g/dL — ABNORMAL LOW (ref 6.5–8.1)

## 2020-09-10 MED ORDER — ZOLEDRONIC ACID 4 MG/100ML IV SOLN
INTRAVENOUS | Status: AC
  Filled 2020-09-10: qty 100

## 2020-09-10 MED ORDER — PROCHLORPERAZINE MALEATE 10 MG PO TABS
10.0000 mg | ORAL_TABLET | Freq: Once | ORAL | Status: AC
  Administered 2020-09-10: 10 mg via ORAL

## 2020-09-10 MED ORDER — BORTEZOMIB CHEMO SQ INJECTION 3.5 MG (2.5MG/ML)
1.3000 mg/m2 | Freq: Once | INTRAMUSCULAR | Status: AC
  Administered 2020-09-10: 2.25 mg via SUBCUTANEOUS
  Filled 2020-09-10: qty 0.9

## 2020-09-10 MED ORDER — SODIUM CHLORIDE 0.9 % IV SOLN
Freq: Once | INTRAVENOUS | Status: AC
  Filled 2020-09-10: qty 250

## 2020-09-10 MED ORDER — CYANOCOBALAMIN 1000 MCG/ML IJ SOLN
1000.0000 ug | Freq: Once | INTRAMUSCULAR | Status: AC
  Administered 2020-09-10: 1000 ug via SUBCUTANEOUS

## 2020-09-10 MED ORDER — PROCHLORPERAZINE MALEATE 10 MG PO TABS
ORAL_TABLET | ORAL | Status: AC
  Filled 2020-09-10: qty 1

## 2020-09-10 MED ORDER — CYANOCOBALAMIN 1000 MCG/ML IJ SOLN
INTRAMUSCULAR | Status: AC
  Filled 2020-09-10: qty 1

## 2020-09-10 MED ORDER — ZOLEDRONIC ACID 4 MG/100ML IV SOLN
4.0000 mg | Freq: Once | INTRAVENOUS | Status: AC
  Administered 2020-09-10: 4 mg via INTRAVENOUS

## 2020-09-10 NOTE — Progress Notes (Signed)
Ok to treat with mild hypocalcemia today per MD.  Acquanetta Belling, RPH, BCPS, BCOP 09/10/2020 3:51 PM

## 2020-09-10 NOTE — Progress Notes (Signed)
Ok to treat w/ zometa today per Dr. Irene Limbo. Per Dr. Irene Limbo Pt should continue to take her Vit. D supplement and increase her dairy intake.

## 2020-09-10 NOTE — Patient Instructions (Signed)
[facility]cer Center Discharge Instructions for Patients Receiving Chemotherapy  Today you received the following chemotherapy agents Velcade To help prevent nausea and vomiting after your treatment, we encourage you to take your nausea medication as prescribed.   If you develop nausea and vomiting that is not controlled by your nausea medication, call the clinic.   BELOW ARE SYMPTOMS THAT SHOULD BE REPORTED IMMEDIATELY:  *FEVER GREATER THAN 100.5 F  *CHILLS WITH OR WITHOUT FEVER  NAUSEA AND VOMITING THAT IS NOT CONTROLLED WITH YOUR NAUSEA MEDICATION  *UNUSUAL SHORTNESS OF BREATH  *UNUSUAL BRUISING OR BLEEDING  TENDERNESS IN MOUTH AND THROAT WITH OR WITHOUT PRESENCE OF ULCERS  *URINARY PROBLEMS  *BOWEL PROBLEMS  UNUSUAL RASH Items with * indicate a potential emergency and should be followed up as soon as possible.  Feel free to call the clinic should you have any questions or concerns. The clinic phone number is [phone].  Please show the CHEMO ALERT CARD at check-in to the Emergency Department and triage nurse.   

## 2020-09-11 ENCOUNTER — Telehealth: Payer: Self-pay | Admitting: Hematology

## 2020-09-11 NOTE — Telephone Encounter (Signed)
Left message with follow-up appointment per 3/15 los. Gave option to call back to reschedule if needed. 

## 2020-09-12 ENCOUNTER — Inpatient Hospital Stay: Payer: Medicare Other

## 2020-09-12 ENCOUNTER — Other Ambulatory Visit: Payer: Self-pay

## 2020-09-12 ENCOUNTER — Ambulatory Visit (HOSPITAL_COMMUNITY): Admission: RE | Admit: 2020-09-12 | Payer: Medicare Other | Source: Ambulatory Visit

## 2020-09-12 VITALS — BP 149/77 | HR 88 | Temp 98.0°F | Resp 18

## 2020-09-12 DIAGNOSIS — C9000 Multiple myeloma not having achieved remission: Secondary | ICD-10-CM

## 2020-09-12 DIAGNOSIS — Z7189 Other specified counseling: Secondary | ICD-10-CM

## 2020-09-12 MED ORDER — BORTEZOMIB CHEMO SQ INJECTION 3.5 MG (2.5MG/ML)
1.3000 mg/m2 | Freq: Once | INTRAMUSCULAR | Status: AC
Start: 2020-09-12 — End: 2020-09-12
  Administered 2020-09-12: 2.25 mg via SUBCUTANEOUS
  Filled 2020-09-12: qty 0.9

## 2020-09-12 MED ORDER — PROCHLORPERAZINE MALEATE 10 MG PO TABS: 10.0000 mg | ORAL_TABLET | Freq: Once | ORAL | Status: DC

## 2020-09-12 NOTE — Patient Instructions (Signed)
[facility] Discharge Instructions for Patients Receiving Chemotherapy  Today you received the following chemotherapy agents: bortezomib.  To help prevent nausea and vomiting after your treatment, we encourage you to take your nausea medication as directed.   If you develop nausea and vomiting that is not controlled by your nausea medication, call the clinic.   BELOW ARE SYMPTOMS THAT SHOULD BE REPORTED IMMEDIATELY:  *FEVER GREATER THAN 100.5 F  *CHILLS WITH OR WITHOUT FEVER  NAUSEA AND VOMITING THAT IS NOT CONTROLLED WITH YOUR NAUSEA MEDICATION  *UNUSUAL SHORTNESS OF BREATH  *UNUSUAL BRUISING OR BLEEDING  TENDERNESS IN MOUTH AND THROAT WITH OR WITHOUT PRESENCE OF ULCERS  *URINARY PROBLEMS  *BOWEL PROBLEMS  UNUSUAL RASH Items with * indicate a potential emergency and should be followed up as soon as possible.  Feel free to call the clinic should you have any questions or concerns. The clinic phone number is [phone].  Please show the CHEMO ALERT CARD at check-in to the Emergency Department and triage nurse.   

## 2020-09-15 ENCOUNTER — Other Ambulatory Visit: Payer: Self-pay | Admitting: Radiology

## 2020-09-16 ENCOUNTER — Inpatient Hospital Stay: Payer: Medicare Other

## 2020-09-16 ENCOUNTER — Other Ambulatory Visit: Payer: Self-pay

## 2020-09-16 VITALS — BP 145/75 | HR 90 | Temp 98.6°F | Resp 18

## 2020-09-16 DIAGNOSIS — C9000 Multiple myeloma not having achieved remission: Secondary | ICD-10-CM

## 2020-09-16 DIAGNOSIS — Z7189 Other specified counseling: Secondary | ICD-10-CM

## 2020-09-16 DIAGNOSIS — Z5111 Encounter for antineoplastic chemotherapy: Secondary | ICD-10-CM

## 2020-09-16 LAB — CBC WITH DIFFERENTIAL/PLATELET
Abs Immature Granulocytes: 0.05 10*3/uL (ref 0.00–0.07)
Basophils Absolute: 0 10*3/uL (ref 0.0–0.1)
Basophils Relative: 1 %
Eosinophils Absolute: 0.2 10*3/uL (ref 0.0–0.5)
Eosinophils Relative: 4 %
HCT: 36.6 % (ref 36.0–46.0)
Hemoglobin: 12 g/dL (ref 12.0–15.0)
Immature Granulocytes: 2 %
Lymphocytes Relative: 18 %
Lymphs Abs: 0.6 10*3/uL — ABNORMAL LOW (ref 0.7–4.0)
MCH: 30.8 pg (ref 26.0–34.0)
MCHC: 32.8 g/dL (ref 30.0–36.0)
MCV: 94.1 fL (ref 80.0–100.0)
Monocytes Absolute: 0.2 10*3/uL (ref 0.1–1.0)
Monocytes Relative: 7 %
Neutro Abs: 2.4 10*3/uL (ref 1.7–7.7)
Neutrophils Relative %: 68 %
Platelets: 127 10*3/uL — ABNORMAL LOW (ref 150–400)
RBC: 3.89 MIL/uL (ref 3.87–5.11)
RDW: 16.5 % — ABNORMAL HIGH (ref 11.5–15.5)
WBC: 3.4 10*3/uL — ABNORMAL LOW (ref 4.0–10.5)
nRBC: 0 % (ref 0.0–0.2)

## 2020-09-16 LAB — CMP (CANCER CENTER ONLY)
ALT: 8 U/L (ref 0–44)
AST: 10 U/L — ABNORMAL LOW (ref 15–41)
Albumin: 3.5 g/dL (ref 3.5–5.0)
Alkaline Phosphatase: 78 U/L (ref 38–126)
Anion gap: 9 (ref 5–15)
BUN: 16 mg/dL (ref 8–23)
CO2: 25 mmol/L (ref 22–32)
Calcium: 8.7 mg/dL — ABNORMAL LOW (ref 8.9–10.3)
Chloride: 102 mmol/L (ref 98–111)
Creatinine: 0.84 mg/dL (ref 0.44–1.00)
GFR, Estimated: >60 mL/min (ref >60)
Glucose, Bld: 101 mg/dL — ABNORMAL HIGH (ref 70–99)
Potassium: 4.3 mmol/L (ref 3.5–5.1)
Sodium: 136 mmol/L (ref 135–145)
Total Bilirubin: 0.4 mg/dL (ref 0.3–1.2)
Total Protein: 6 g/dL — ABNORMAL LOW (ref 6.5–8.1)

## 2020-09-16 MED ORDER — BORTEZOMIB CHEMO SQ INJECTION 3.5 MG (2.5MG/ML)
1.3000 mg/m2 | Freq: Once | INTRAMUSCULAR | Status: AC
  Administered 2020-09-16: 2.25 mg via SUBCUTANEOUS
  Filled 2020-09-16: qty 0.9

## 2020-09-16 MED ORDER — PROCHLORPERAZINE MALEATE 10 MG PO TABS
10.0000 mg | ORAL_TABLET | Freq: Once | ORAL | Status: AC
  Administered 2020-09-16: 10 mg via ORAL

## 2020-09-16 MED ORDER — PROCHLORPERAZINE MALEATE 10 MG PO TABS
ORAL_TABLET | ORAL | Status: AC
  Filled 2020-09-16: qty 1

## 2020-09-16 MED ORDER — CYANOCOBALAMIN 1000 MCG/ML IJ SOLN
1000.0000 ug | Freq: Once | INTRAMUSCULAR | Status: AC
  Administered 2020-09-16: 1000 ug via SUBCUTANEOUS

## 2020-09-16 MED ORDER — CYANOCOBALAMIN 1000 MCG/ML IJ SOLN
INTRAMUSCULAR | Status: AC
  Filled 2020-09-16: qty 1

## 2020-09-16 NOTE — Patient Instructions (Signed)
[facility] Discharge Instructions for Patients Receiving Chemotherapy  Today you received the following chemotherapy agents Bortezimib(Velcade injection)   To help prevent nausea and vomiting after your treatment, we encourage you to take your nausea medication as directed.   If you develop nausea and vomiting that is not controlled by your nausea medication, call the clinic.   BELOW ARE SYMPTOMS THAT SHOULD BE REPORTED IMMEDIATELY:  *FEVER GREATER THAN 100.5 F  *CHILLS WITH OR WITHOUT FEVER  NAUSEA AND VOMITING THAT IS NOT CONTROLLED WITH YOUR NAUSEA MEDICATION  *UNUSUAL SHORTNESS OF BREATH  *UNUSUAL BRUISING OR BLEEDING  TENDERNESS IN MOUTH AND THROAT WITH OR WITHOUT PRESENCE OF ULCERS  *URINARY PROBLEMS  *BOWEL PROBLEMS  UNUSUAL RASH Items with * indicate a potential emergency and should be followed up as soon as possible.  Feel free to call the clinic should you have any questions or concerns. The clinic phone number is [phone].  Please show the CHEMO ALERT CARD at check-in to the Emergency Department and triage nurse.   

## 2020-09-17 ENCOUNTER — Telehealth (HOSPITAL_COMMUNITY): Payer: Self-pay

## 2020-09-17 ENCOUNTER — Ambulatory Visit (HOSPITAL_COMMUNITY): Admission: RE | Admit: 2020-09-17 | Payer: Medicare Other | Source: Ambulatory Visit

## 2020-09-17 ENCOUNTER — Ambulatory Visit (HOSPITAL_COMMUNITY): Payer: Medicare Other

## 2020-09-17 LAB — KAPPA/LAMBDA LIGHT CHAINS
Kappa free light chain: 14.7 mg/L (ref 3.3–19.4)
Kappa, lambda light chain ratio: 1.47 (ref 0.26–1.65)
Lambda free light chains: 10 mg/L (ref 5.7–26.3)

## 2020-09-18 ENCOUNTER — Other Ambulatory Visit: Payer: Self-pay | Admitting: *Deleted

## 2020-09-18 ENCOUNTER — Other Ambulatory Visit: Payer: Self-pay | Admitting: Registered Nurse

## 2020-09-18 ENCOUNTER — Encounter: Payer: Self-pay | Admitting: Hematology

## 2020-09-18 DIAGNOSIS — C9000 Multiple myeloma not having achieved remission: Secondary | ICD-10-CM

## 2020-09-18 LAB — MULTIPLE MYELOMA PANEL, SERUM
Albumin SerPl Elph-Mcnc: 3.4 g/dL (ref 2.9–4.4)
Albumin/Glob SerPl: 1.5 (ref 0.7–1.7)
Alpha 1: 0.3 g/dL (ref 0.0–0.4)
Alpha2 Glob SerPl Elph-Mcnc: 0.8 g/dL (ref 0.4–1.0)
B-Globulin SerPl Elph-Mcnc: 0.8 g/dL (ref 0.7–1.3)
Gamma Glob SerPl Elph-Mcnc: 0.5 g/dL (ref 0.4–1.8)
Globulin, Total: 2.4 g/dL (ref 2.2–3.9)
IgA: 25 mg/dL — ABNORMAL LOW (ref 64–422)
IgG (Immunoglobin G), Serum: 535 mg/dL — ABNORMAL LOW (ref 586–1602)
IgM (Immunoglobulin M), Srm: 25 mg/dL — ABNORMAL LOW (ref 26–217)
M Protein SerPl Elph-Mcnc: 0.2 g/dL — ABNORMAL HIGH
Total Protein ELP: 5.8 g/dL — ABNORMAL LOW (ref 6.0–8.5)

## 2020-09-18 MED ORDER — OXYCODONE-ACETAMINOPHEN 5-325 MG PO TABS: 1.0000 | ORAL_TABLET | Freq: Four times a day (QID) | ORAL | 0 refills | Status: DC | PRN

## 2020-09-18 NOTE — Telephone Encounter (Signed)
Requested medication (s) are due for refill today: historical provider  Requested medication (s) are on the active medication list: yes  Last refill:  na  Future visit scheduled: no  Notes to clinic:  historical provider. Do you want to renew Rx?     Requested Prescriptions  Pending Prescriptions Disp Refills   meloxicam (MOBIC) 15 MG tablet [Pharmacy Med Name: MELOXICAM 15 MG TABLET] 90 tablet     Sig: TAKE 1 TABLET BY MOUTH EVERY DAY      Analgesics:  COX2 Inhibitors Passed - 09/18/2020 11:27 AM      Passed - HGB in normal range and within 360 days    Hemoglobin  Date Value Ref Range Status  09/16/2020 12.0 12.0 - 15.0 g/dL Final  06/03/2020 12.7 12.0 - 15.0 g/dL Final          Passed - Cr in normal range and within 360 days    Creatinine  Date Value Ref Range Status  09/16/2020 0.84 0.44 - 1.00 mg/dL Final          Passed - Patient is not pregnant      Passed - Valid encounter within last 12 months    Recent Outpatient Visits           2 months ago Severe pain   [facility], [place], NP   4 months ago Multiple myeloma not having achieved remission [facility])   [facility] at [place], NP   4 months ago    [facility], [place], NP

## 2020-09-18 NOTE — Telephone Encounter (Signed)
Patient requested refill of oxycodone - refill request routed to Dr. Irene Limbo

## 2020-09-19 ENCOUNTER — Inpatient Hospital Stay: Payer: Medicare Other

## 2020-09-19 ENCOUNTER — Other Ambulatory Visit: Payer: Self-pay | Admitting: Hematology

## 2020-09-19 ENCOUNTER — Other Ambulatory Visit: Payer: Self-pay

## 2020-09-19 VITALS — BP 143/69 | HR 82 | Temp 98.1°F | Resp 18

## 2020-09-19 DIAGNOSIS — C9000 Multiple myeloma not having achieved remission: Secondary | ICD-10-CM

## 2020-09-19 DIAGNOSIS — Z7189 Other specified counseling: Secondary | ICD-10-CM

## 2020-09-19 MED ORDER — PROCHLORPERAZINE MALEATE 10 MG PO TABS
ORAL_TABLET | ORAL | Status: AC
  Filled 2020-09-19: qty 1

## 2020-09-19 MED ORDER — PROCHLORPERAZINE MALEATE 10 MG PO TABS
10.0000 mg | ORAL_TABLET | Freq: Once | ORAL | Status: AC
  Administered 2020-09-19: 10 mg via ORAL

## 2020-09-19 MED ORDER — BORTEZOMIB CHEMO SQ INJECTION 3.5 MG (2.5MG/ML)
1.3000 mg/m2 | Freq: Once | INTRAMUSCULAR | Status: AC
  Administered 2020-09-19: 2.25 mg via SUBCUTANEOUS
  Filled 2020-09-19: qty 0.9

## 2020-09-19 NOTE — Patient Instructions (Signed)
[facility] Discharge Instructions for Patients Receiving Chemotherapy  Today you received the following chemotherapy agents Bortezimib(Velcade injection).  To help prevent nausea and vomiting after your treatment, we encourage you to take your nausea medication as directed.  If you develop nausea and vomiting that is not controlled by your nausea medication, call the clinic.   BELOW ARE SYMPTOMS THAT SHOULD BE REPORTED IMMEDIATELY:  *FEVER GREATER THAN 100.5 F  *CHILLS WITH OR WITHOUT FEVER  NAUSEA AND VOMITING THAT IS NOT CONTROLLED WITH YOUR NAUSEA MEDICATION  *UNUSUAL SHORTNESS OF BREATH  *UNUSUAL BRUISING OR BLEEDING  TENDERNESS IN MOUTH AND THROAT WITH OR WITHOUT PRESENCE OF ULCERS  *URINARY PROBLEMS  *BOWEL PROBLEMS  UNUSUAL RASH Items with * indicate a potential emergency and should be followed up as soon as possible.  Feel free to call the clinic should you have any questions or concerns. The clinic phone number is [phone].  Please show the [place] at check-in to the Emergency Department and triage nurse.

## 2020-09-19 NOTE — Telephone Encounter (Signed)
No further refills without office visit 

## 2020-09-23 NOTE — Progress Notes (Signed)
HEMATOLOGY/ONCOLOGY CONSULTATION NOTE  Date of Service: 09/24/2020  Patient Care Team: [name], NP as PCP - General (Adult Health Nurse Practitioner)  CHIEF COMPLAINTS/PURPOSE OF CONSULTATION:  mx of myeloma  HISTORY OF PRESENTING ILLNESS:  Lisa Lester is a wonderful 75 y.o. female who has been referred to [place] by Dr. 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 Pt is accompanied today by her nephew. The pt reports that she is doing well overall.   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 To treat this the pt had a Kyphoplasty and radiation therapy. Pt also had a BM Bx, urinalysis, and a bone scan completed at the time. There was no official diagnosis of MGUS or Multiple Myeloma at this time, but they continued to monitor.   A few weeks ago ptt began having significant pain in her chest, which was thought to be a fractured sternum. Pt describes the pain as "flesh being ripped off of her bones". She was also having left-sided neck and back discomfort at this time. Pt was taken to the hospital where her pain was treated and she received a right Iliac Bx, BM Bx, and CT Chest while admitted. They did not complete any whole body imaging at this time.   She had a root canal nearly 1.5 months ago. Pt discontinued taking Thyroid replacement 5-6 years ago. Pt is also no longer taking any HTN medications. She was asked to restart Gabapentin yesterday, which she was given for pain control, but felt ill after taking it. She continues taking Xanax and Hydrocodone every 4 hours. Pt reports some constipation and takes Colace occasionally.   Pt had COVID19 in July of 2020. Her husband also had the virus which caused his passing. Pt has received two doses of the COVID19 vaccine but has not received the booster.  Of note since the patient's last visit, pt has had CT Chest ([id number]) completed on 04/09/2020  with results revealing "1. Chronic posterior surgical fusion of L2 and L4 with bilateral transpedicular screws and bridging rods. Hardware intact appearing. 2. Status post vertebroplasty of L3. There is an underlying lytic lesion at the L3 vertebral body which extends into its left pedicle. These findings correlate with the provided history of a known plasmacytoma. 3. There are numerous lytic skeletal lesions at the chest, abdomen and pelvis, more numerous at the spine and ribs. 4. Multiple lytic lesions are present at the sternum, with interval development of a pathologic fracture at the sternum without significant displacement. 5. Stable mild superior endplate compression deformity at T11. 6. 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 Similar findings were present on prior study from 03/30/12, the degree of biliary ductal dilation has worsened as compared to the prior study. No choledochal stone identified.   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  Most recent lab results (04/11/2020) of CBC w/diff is as follows: all values are WNL except for Lymphs Rel  at 18.5. 04/10/2020 CMP is as follows: all values are WNL except for CO2 at 33, Glucose at 120, Calcium at 11.6, Anion gap at 3.  On review of systems, pt reports constipation, chest pain, neck pain, back pain and denies dysuria and any other symptoms.   On PMHx the pt reports Kyphoplasty,  Plasmacytoma, HTN, Thyroid disorder. On Social Hx the pt reports that she is a non-smoker and only drinks alcohol socially.   INTERVAL HISTORY:  Lisa Lester is a wonderful 75 y.o. 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 The patient's last visit with [place] was on 09/10/2020. The pt reports that she is doing well overall.  The pt reports that the first Pet scan she was originally scheduled for broke the day of her appointment. The second appointment she was not informed to be there 90 minutes early and was denied being able to do it. The pt notes that she still has the persistent pain in the back of her neck and head. The pt feels that it is spasms that go up her head and are positional to certain positions or movements such as coughing and sneezing. The pt notes that she did not tolerate Gabapentin well. The pt notes that she also hears a "crackling" noise. The pt notes that she does not feel much relief from the Fentanyl patch. The pt notes her pain wakes her out of her sleep and she lays there in pain. The pt notes that it seems to her she had less pain when she was on the Flexeril. The pt also notes that she has not gotten her taste buds back and appetite is still suppressed.  The pt notes she currently prefers to make a decision regarding her move after the results are received from the PET and Bm Bx.  The pt is scheduled for NM PET Whole Body on 04/05 and CT Bm Bx on 04/07.  Lab results today 09/24/2020 of CBC w/diff and CMP is as follows: all values are WNL except for WBC of 3.4K, RBC of 3.59, Hgb of 11.1, HCT of 34.3, RDW of 16.6, Plt of 85K, Lymphs Abs of 0.6K, Calcium of 7.3, Total Protein of 5.5, Albumin of 3.3, AST of 8.  On review of systems, pt reports continued neck/head pain, difficulty sleeping, muscle spasms, anxiety and denies abdominal pain, leg swelling, change in bowel habits, and any other symptoms.  MEDICAL HISTORY:  Past Medical History:   Diagnosis Date  . Multiple myeloma ([place])   . Pathologic fracture    L3  . Plasmacytoma ([place])     SURGICAL HISTORY: Past Surgical History:  Procedure Laterality Date  . BACK SURGERY    . BREAST ENHANCEMENT SURGERY Bilateral   . LUMBAR FUSION     L2 - L4, hx of pathologic fx of L3  . RADIOLOGY WITH ANESTHESIA N/A 06/13/2020   Procedure: MRI BRAIN WITHOUT; C-SPINE WITHOUT;  Surgeon: Radiologist, Medication, MD;  Location: [place] [facility];  Service: Radiology;  Laterality: N/A;    SOCIAL HISTORY: Social History   Socioeconomic History  . Marital status: Widowed    Spouse name: russell Purifoy  . Number of children: 0  . Years of education: 83  . Highest education level: 12th grade  Occupational History  . Not on file  Tobacco Use  . Smoking status: Never Smoker  . Smokeless tobacco: Never Used  Vaping Use  . Vaping Use: Never used  Substance and Sexual Activity  . Alcohol use:  Never  . Drug use: Never  . Sexual activity: Not Currently  Other Topics Concern  . Not on file  Social History Narrative  . Not on file   Social Determinants of Health   Financial Resource Strain: Not on file  Food Insecurity: Not on file  Transportation Needs: Not on file  Physical Activity: Not on file  Stress: Not on file  Social Connections: Not on file  Intimate Partner Violence: Not on file    FAMILY HISTORY: Family History  Problem Relation Age of Onset  . Cancer - Other Mother   . Cancer - Other Father     ALLERGIES:  has No Known Allergies.  MEDICATIONS:  Current Outpatient Medications  Medication Sig Dispense Refill  . acyclovir (ZOVIRAX) 400 MG tablet Take 400 mg by mouth 2 (two) times daily.    Marland Kitchen ALPRAZolam (XANAX) 0.5 MG tablet Take 1 tablet (0.5 mg total) by mouth 3 (three) times daily as needed for anxiety. 15 tablet 0  . aspirin EC 81 MG tablet Take 81 mg by mouth daily. Swallow whole.    . Cyanocobalamin (B-12) 1000 MCG SUBL Place 1,000 mcg under the tongue daily. 60  tablet 2  . cyclobenzaprine (FLEXERIL) 5 MG tablet Take 1 tablet (5 mg total) by mouth 3 (three) times daily as needed for muscle spasms. 30 tablet 0  . ergocalciferol (VITAMIN D2) 1.25 MG (50000 UT) capsule Take 1 capsule (50,000 Units total) by mouth 2 (two) times a week. 16 capsule 2  . fentaNYL (DURAGESIC) 25 MCG/HR Place 1 patch onto the skin every 3 (three) days. 10 patch 0  . lenalidomide (REVLIMID) 15 MG capsule TAKE 1 CAPSULE BY MOUTH  DAILY FOR 14 DAYS, THEN 7  DAYS OFF 14 capsule 0  . lidocaine (LIDODERM) 5 % Place 1 patch onto the skin daily. Remove & Discard patch within 12 hours or as directed by MD on the neck 30 patch 5  . meloxicam (MOBIC) 15 MG tablet TAKE 1 TABLET BY MOUTH EVERY DAY 30 tablet 0  . nystatin (MYCOSTATIN) 100000 UNIT/ML suspension Take 5 mLs (500,000 Units total) by mouth 4 (four) times daily. 200 mL 0  . ondansetron (ZOFRAN) 8 MG tablet Take 1 tablet (8 mg total) by mouth every 8 (eight) hours as needed for nausea or vomiting. 20 tablet 0  . oxyCODONE-acetaminophen (PERCOCET/ROXICET) 5-325 MG tablet Take 1 tablet by mouth every 6 (six) hours as needed for moderate pain or severe pain. 90 tablet 0  . pantoprazole (PROTONIX) 40 MG tablet Take 1 tablet (40 mg total) by mouth daily. 30 tablet 1  . polyethylene glycol (MIRALAX) 17 g packet Take 17 g by mouth daily. (Patient taking differently: Take 17 g by mouth daily as needed for mild constipation.) 30 each 1  . prochlorperazine (COMPAZINE) 10 MG tablet Take 1 tablet (10 mg total) by mouth every 6 (six) hours as needed for nausea or vomiting. 30 tablet 0  . senna-docusate (SENNA S) 8.6-50 MG tablet Take 2 tablets by mouth 2 (two) times daily. 60 tablet 2  . DULoxetine (CYMBALTA) 30 MG capsule TAKE 1 CAPSULE BY MOUTH EVERY DAY (Patient not taking: Reported on 09/24/2020) 90 capsule 2   No current facility-administered medications for this visit.    REVIEW OF SYSTEMS:   10 Point review of Systems was done is negative  except as noted above.  PHYSICAL EXAMINATION: ECOG PERFORMANCE STATUS: 2 - Symptomatic, <50% confined to bed  . Vitals:   09/24/20 1500  BP: 123/65  Pulse: 91  Resp: 17  Temp: (!) 97.5 F (36.4 C)  SpO2: 100%   Filed Weights   09/24/20 1500  Weight: 144 lb 1.6 oz (65.4 kg)   .Body mass index is 23.98 kg/m.  Exam was given in a wheelchair.   GENERAL:alert, in no acute distress and comfortable SKIN: no acute rashes, no significant lesions EYES: conjunctiva are pink and non-injected, sclera anicteric OROPHARYNX: MMM, no exudates, no oropharyngeal erythema or ulceration NECK: supple, no JVD LYMPH:  no palpable lymphadenopathy in the cervical, axillary or inguinal regions LUNGS: clear to auscultation b/l with normal respiratory effort HEART: regular rate & rhythm ABDOMEN:  normoactive bowel sounds , non tender, not distended. Extremity: no pedal edema PSYCH: alert & oriented x 3 with fluent speech NEURO: no focal motor/sensory deficits  LABORATORY DATA:  I have reviewed the data as listed  . CBC Latest Ref Rng & Units 09/24/2020 09/16/2020 09/10/2020  WBC 4.0 - 10.5 K/uL 3.4(L) 3.4(L) 3.2(L)  Hemoglobin 12.0 - 15.0 g/dL 11.1(L) 12.0 11.8(L)  Hematocrit 36.0 - 46.0 % 34.3(L) 36.6 35.6(L)  Platelets 150 - 400 K/uL 85(L) 127(L) 232    . CMP Latest Ref Rng & Units 09/24/2020 09/16/2020 09/10/2020  Glucose 70 - 99 mg/dL 93 101(H) 107(H)  BUN 8 - 23 mg/dL '12 16 9  ' Creatinine 0.44 - 1.00 mg/dL 0.77 0.84 0.77  Sodium 135 - 145 mmol/L 143 136 139  Potassium 3.5 - 5.1 mmol/L 3.9 4.3 5.0  Chloride 98 - 111 mmol/L 107 102 106  CO2 22 - 32 mmol/L '26 25 28  ' Calcium 8.9 - 10.3 mg/dL 7.3(L) 8.7(L) 8.4(L)  Total Protein 6.5 - 8.1 g/dL 5.5(L) 6.0(L) 5.9(L)  Total Bilirubin 0.3 - 1.2 mg/dL 0.5 0.4 0.3  Alkaline Phos 38 - 126 U/L 77 78 82  AST 15 - 41 U/L 8(L) 10(L) 9(L)  ALT 0 - 44 U/L <'6 8 7   ' Component     Latest Ref Rng & Units 07/29/2020  IgG (Immunoglobin G), Serum     586 -  1,602 mg/dL 687  IgA     64 - 422 mg/dL 27 (L)  IgM (Immunoglobulin M), Srm     26 - 217 mg/dL 35  Total Protein ELP     6.0 - 8.5 g/dL 5.7 (L)  Albumin SerPl Elph-Mcnc     2.9 - 4.4 g/dL 3.4  Alpha 1     0.0 - 0.4 g/dL 0.3  Alpha2 Glob SerPl Elph-Mcnc     0.4 - 1.0 g/dL 0.8  B-Globulin SerPl Elph-Mcnc     0.7 - 1.3 g/dL 0.7  Gamma Glob SerPl Elph-Mcnc     0.4 - 1.8 g/dL 0.6  M Protein SerPl Elph-Mcnc     Not Observed g/dL 0.3 (H)  Globulin, Total     2.2 - 3.9 g/dL 2.3  Albumin/Glob SerPl     0.7 - 1.7 1.5  IFE 1      Comment (A)  Please Note (HCV):      Comment  Kappa free light chain     3.3 - 19.4 mg/L 21.0 (H)  Lamda free light chains     5.7 - 26.3 mg/L 14.7  Kappa, lamda light chain ratio     0.26 - 1.65 1.43    RADIOGRAPHIC STUDIES: I have personally reviewed the radiological images as listed and agreed with the findings in the report. No results found.  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  Previous h/o plasmacytomas 05/15/2020 M Protein at 0.7 g/dL 05/24/2020 PET/CT ([id number]) revealed "1. There are multiple hypermetabolic lytic osseous lesions consistent with multiple myeloma. The most prominent lesion is within the left lateral mass of C1 and this likely involves the foramen transversarium. 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 Calcium lower. -Discussed pt recent labwork, 09/16/2020; light chains have normalized and pt has stable minor m-protein at 0.2. -Advised pt that if there are no signs of active myeloma on PET scan, then the next step is to observe the amount of bone loss the pt has from previous tumor and potential MRI C spine. -Advised pt that given location and size of  her tumor, it is not uncommon to be experiencing the pain she has currently and muscle loss. -Discussed Cymbalta and Gabapentin. The pt does not tolerate these well. -Discussed long term pain management versus surgery. The pt desires pain management at this time. -Advised pt that we can increase pain medications but there is a risk of repeat hospitalization with acute confusion. -Discussed pt's decision to move home to [facility] and her current needs. Recommended pt make a decision informed including needing help, living alone, and other factors. The pt is not as secure in moving as she could at this time. Will hold referral at this time. -Recommended that the pt continue to eat well, drink at least 48-64 oz of water each day, and walk 20-30 minutes each day.  -Continue 1000 mcg Vitamin B12 & one B-complex daily.  -Continue daily baby ASA and Acyclovir BID. -Continue Zometa q4weeks. -Start 500 mg Calcium BID if not enough calcium in diet. -Recommended OTC Voltaren gel for spot with muscle spasms. -Will see back in 2 weeks prior to 04/15 to review imaging.   FOLLOW UP: RTC with Dr Irene Limbo with labs on 10/10/2020 to review pet and bm bx   The total time spent in the appointment was 30 minutes and more than 50% was on counseling and direct patient cares.    All of the patient's questions were answered with apparent satisfaction. The patient knows to call the clinic with any problems, questions or concerns.    Sullivan Lone MD [place] AAHIVMS [facility] Hematology/Oncology Physician [facility]  (Office):       [phone] (Work cell):  [phone] (Fax):           [phone]  09/24/2020 3:37 PM   I, Reinaldo Raddle, am acting as scribe for Dr. Sullivan Lone, MD.   .Brunetta Genera MD

## 2020-09-24 ENCOUNTER — Inpatient Hospital Stay: Payer: Medicare Other

## 2020-09-24 ENCOUNTER — Other Ambulatory Visit: Payer: Self-pay

## 2020-09-24 ENCOUNTER — Inpatient Hospital Stay (HOSPITAL_BASED_OUTPATIENT_CLINIC_OR_DEPARTMENT_OTHER): Payer: Medicare Other | Admitting: Hematology

## 2020-09-24 VITALS — BP 123/65 | HR 91 | Temp 97.5°F | Resp 17 | Ht 65.0 in | Wt 144.1 lb

## 2020-09-24 DIAGNOSIS — C9000 Multiple myeloma not having achieved remission: Secondary | ICD-10-CM

## 2020-09-24 DIAGNOSIS — Z5111 Encounter for antineoplastic chemotherapy: Secondary | ICD-10-CM | POA: Diagnosis not present

## 2020-09-24 LAB — CMP (CANCER CENTER ONLY)
ALT: <6 U/L (ref 0–44)
AST: 8 U/L — ABNORMAL LOW (ref 15–41)
Albumin: 3.3 g/dL — ABNORMAL LOW (ref 3.5–5.0)
Alkaline Phosphatase: 77 U/L (ref 38–126)
Anion gap: 10 (ref 5–15)
BUN: 12 mg/dL (ref 8–23)
CO2: 26 mmol/L (ref 22–32)
Calcium: 7.3 mg/dL — ABNORMAL LOW (ref 8.9–10.3)
Chloride: 107 mmol/L (ref 98–111)
Creatinine: 0.77 mg/dL (ref 0.44–1.00)
GFR, Estimated: >60 mL/min (ref >60)
Glucose, Bld: 93 mg/dL (ref 70–99)
Potassium: 3.9 mmol/L (ref 3.5–5.1)
Sodium: 143 mmol/L (ref 135–145)
Total Bilirubin: 0.5 mg/dL (ref 0.3–1.2)
Total Protein: 5.5 g/dL — ABNORMAL LOW (ref 6.5–8.1)

## 2020-09-24 LAB — CBC WITH DIFFERENTIAL/PLATELET
Abs Immature Granulocytes: 0.06 10*3/uL (ref 0.00–0.07)
Basophils Absolute: 0 10*3/uL (ref 0.0–0.1)
Basophils Relative: 0 %
Eosinophils Absolute: 0.2 10*3/uL (ref 0.0–0.5)
Eosinophils Relative: 7 %
HCT: 34.3 % — ABNORMAL LOW (ref 36.0–46.0)
Hemoglobin: 11.1 g/dL — ABNORMAL LOW (ref 12.0–15.0)
Immature Granulocytes: 2 %
Lymphocytes Relative: 17 %
Lymphs Abs: 0.6 10*3/uL — ABNORMAL LOW (ref 0.7–4.0)
MCH: 30.9 pg (ref 26.0–34.0)
MCHC: 32.4 g/dL (ref 30.0–36.0)
MCV: 95.5 fL (ref 80.0–100.0)
Monocytes Absolute: 0.5 10*3/uL (ref 0.1–1.0)
Monocytes Relative: 16 %
Neutro Abs: 2 10*3/uL (ref 1.7–7.7)
Neutrophils Relative %: 58 %
Platelets: 85 10*3/uL — ABNORMAL LOW (ref 150–400)
RBC: 3.59 MIL/uL — ABNORMAL LOW (ref 3.87–5.11)
RDW: 16.6 % — ABNORMAL HIGH (ref 11.5–15.5)
WBC: 3.4 10*3/uL — ABNORMAL LOW (ref 4.0–10.5)
nRBC: 0 % (ref 0.0–0.2)

## 2020-09-26 ENCOUNTER — Other Ambulatory Visit: Payer: Self-pay | Admitting: Adult Health

## 2020-09-30 ENCOUNTER — Encounter: Payer: Self-pay | Admitting: Hematology

## 2020-09-30 ENCOUNTER — Telehealth: Payer: Self-pay | Admitting: *Deleted

## 2020-09-30 ENCOUNTER — Other Ambulatory Visit: Payer: Self-pay | Admitting: Hematology

## 2020-09-30 DIAGNOSIS — R52 Pain, unspecified: Secondary | ICD-10-CM

## 2020-09-30 MED ORDER — CYCLOBENZAPRINE HCL 5 MG PO TABS: 5.0000 mg | ORAL_TABLET | Freq: Three times a day (TID) | ORAL | 0 refills | Status: AC | PRN

## 2020-09-30 MED ORDER — FUROSEMIDE 20 MG PO TABS: 20.0000 mg | ORAL_TABLET | Freq: Every day | ORAL | 0 refills | Status: DC

## 2020-09-30 NOTE — Telephone Encounter (Signed)
Opened in error - response sent via Mychart

## 2020-10-01 ENCOUNTER — Other Ambulatory Visit: Payer: Self-pay

## 2020-10-01 ENCOUNTER — Ambulatory Visit (HOSPITAL_COMMUNITY)
Admission: RE | Admit: 2020-10-01 | Discharge: 2020-10-01 | Disposition: A | Discharge status: Home / Self Care | Payer: Medicare Other | Source: Ambulatory Visit | Attending: Hematology | Admitting: Hematology

## 2020-10-01 DIAGNOSIS — I700 Atherosclerosis of aorta: Secondary | ICD-10-CM | POA: Insufficient documentation

## 2020-10-01 DIAGNOSIS — Z5111 Encounter for antineoplastic chemotherapy: Secondary | ICD-10-CM

## 2020-10-01 DIAGNOSIS — C9000 Multiple myeloma not having achieved remission: Secondary | ICD-10-CM | POA: Insufficient documentation

## 2020-10-01 DIAGNOSIS — I2510 Atherosclerotic heart disease of native coronary artery without angina pectoris: Secondary | ICD-10-CM | POA: Diagnosis not present

## 2020-10-01 MED ORDER — FLUDEOXYGLUCOSE F - 18 (FDG) INJECTION
8.0000 | Freq: Once | INTRAVENOUS | Status: AC | PRN
  Administered 2020-10-01: 7.27 via INTRAVENOUS

## 2020-10-02 ENCOUNTER — Ambulatory Visit: Payer: Medicare Other | Admitting: Registered Nurse

## 2020-10-02 ENCOUNTER — Other Ambulatory Visit: Payer: Self-pay | Admitting: Student

## 2020-10-02 ENCOUNTER — Encounter: Payer: Self-pay | Admitting: Registered Nurse

## 2020-10-02 VITALS — BP 132/70 | HR 64 | Temp 98.2°F | Resp 16 | Ht 65.0 in | Wt 142.3 lb

## 2020-10-02 DIAGNOSIS — R609 Edema, unspecified: Secondary | ICD-10-CM

## 2020-10-02 LAB — CBC WITH DIFFERENTIAL/PLATELET
Basophils Absolute: 0 10*3/uL (ref 0.0–0.1)
Basophils Relative: 1.2 % (ref 0.0–3.0)
Eosinophils Absolute: 0.1 10*3/uL (ref 0.0–0.7)
Eosinophils Relative: 4.2 % (ref 0.0–5.0)
HCT: 32.9 % — ABNORMAL LOW (ref 36.0–46.0)
Hemoglobin: 11.2 g/dL — ABNORMAL LOW (ref 12.0–15.0)
Lymphocytes Relative: 21.2 % (ref 12.0–46.0)
Lymphs Abs: 0.5 10*3/uL — ABNORMAL LOW (ref 0.7–4.0)
MCHC: 34.1 g/dL (ref 30.0–36.0)
MCV: 94.2 fl (ref 78.0–100.0)
Monocytes Absolute: 0.4 10*3/uL (ref 0.1–1.0)
Monocytes Relative: 17.5 % — ABNORMAL HIGH (ref 3.0–12.0)
Neutro Abs: 1.3 10*3/uL — ABNORMAL LOW (ref 1.4–7.7)
Neutrophils Relative %: 55.9 % (ref 43.0–77.0)
Platelets: 297 10*3/uL (ref 150.0–400.0)
RBC: 3.49 Mil/uL — ABNORMAL LOW (ref 3.87–5.11)
RDW: 18.5 % — ABNORMAL HIGH (ref 11.5–15.5)
WBC: 2.3 10*3/uL — ABNORMAL LOW (ref 4.0–10.5)

## 2020-10-02 LAB — GLUCOSE, CAPILLARY: Glucose-Capillary: 94 mg/dL (ref 70–99)

## 2020-10-02 LAB — BRAIN NATRIURETIC PEPTIDE: Pro B Natriuretic peptide (BNP): 50 pg/mL (ref 0.0–100.0)

## 2020-10-02 MED ORDER — HYDROCHLOROTHIAZIDE 12.5 MG PO TABS: 12.5000 mg | ORAL_TABLET | Freq: Every day | ORAL | 0 refills | Status: DC

## 2020-10-02 NOTE — Progress Notes (Signed)
Established Patient Office Visit  Subjective:  Patient ID: Lisa Lester, female    DOB: 1945/08/02  Age: 75 y.o. MRN: [id number]  CC:  Chief Complaint  Patient presents with  . Leg Swelling    Pt has leg swelling, started lasix 2 days ago wants them checked, notes a little improvement     HPI Lisa Lester presents for follow up  Had PET CT yesterday - results not in yet. Bone Marrow biopsy tomorrow. Finished with treatment. Feeling well overall  Starting to consider a move back to [facility]. Wants to ensure she is safe and stable before this.  Did note in the past week or so that she's had some mild swelling around her ankles. This has come along with feelings of fullness in hands and jaw. No med changes, diet changes, or new exposures. Has not happened before. Denies chest pain, shob, doe, headache, visual changes. Vitals wnl today.  No further concerns.  Past Medical History:  Diagnosis Date  . Multiple myeloma ([place])   . Pathologic fracture    L3  . 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 BACK SURGERY    . BREAST ENHANCEMENT SURGERY Bilateral   . LUMBAR FUSION     L2 - L4, hx of pathologic fx of L3  . RADIOLOGY WITH ANESTHESIA N/A 06/13/2020   Procedure: MRI BRAIN WITHOUT; C-SPINE WITHOUT;  Surgeon: Radiologist, Medication, MD;  Location: [place];  Service: Radiology;  Laterality: N/A;    Family History  Problem Relation Age of Onset  . Cancer - Other Mother   . Cancer - Other Father     Social History   Socioeconomic History  . Marital status: Widowed    Spouse name: russell Shackett  . Number of children: 0  . Years of education: 59  . Highest education level: 12th grade  Occupational History  . Not on file  Tobacco Use  . Smoking status: Never Smoker  . Smokeless tobacco: Never Used  Vaping Use  . Vaping Use: Never used  Substance and Sexual Activity  . Alcohol use: Never  . Drug use: Never  . Sexual activity: Not  Currently  Other Topics Concern  . Not on file  Social History Narrative  . Not on file   Social Determinants of Health   Financial Resource Strain: Not on file  Food Insecurity: Not on file  Transportation Needs: Not on file  Physical Activity: Not on file  Stress: Not on file  Social Connections: Not on file  Intimate Partner Violence: Not on file    Outpatient Medications Prior to Visit  Medication Sig Dispense Refill  . acyclovir (ZOVIRAX) 400 MG tablet Take 400 mg by mouth 2 (two) times daily.    Marland Kitchen ALPRAZolam (XANAX) 0.5 MG tablet Take 1 tablet (0.5 mg total) by mouth 3 (three) times daily as needed for anxiety. 15 tablet 0  . aspirin EC 81 MG tablet Take 81 mg by mouth daily. Swallow whole.    . Cyanocobalamin (B-12) 1000 MCG SUBL Place 1,000 mcg under the tongue daily. 60 tablet 2  . cyclobenzaprine (FLEXERIL) 5 MG tablet Take 1 tablet (5 mg total) by mouth 3 (three) times daily as needed for muscle spasms. 30 tablet 0  . DULoxetine (CYMBALTA) 30 MG capsule TAKE 1 CAPSULE BY MOUTH EVERY DAY 90 capsule 2  . ergocalciferol (VITAMIN D2) 1.25 MG (50000 UT) capsule Take 1 capsule (50,000 Units total) by  mouth 2 (two) times a week. 16 capsule 2  . fentaNYL (DURAGESIC) 25 MCG/HR Place 1 patch onto the skin every 3 (three) days. 10 patch 0  . furosemide (LASIX) 20 MG tablet Take 1 tablet (20 mg total) by mouth daily. 30 tablet 0  . lenalidomide (REVLIMID) 15 MG capsule TAKE 1 CAPSULE BY MOUTH  DAILY FOR 14 DAYS, THEN 7  DAYS OFF 14 capsule 0  . lidocaine (LIDODERM) 5 % Place 1 patch onto the skin daily. Remove & Discard patch within 12 hours or as directed by MD on the neck 30 patch 5  . meloxicam (MOBIC) 15 MG tablet TAKE 1 TABLET BY MOUTH EVERY DAY 30 tablet 0  . nystatin (MYCOSTATIN) 100000 UNIT/ML suspension Take 5 mLs (500,000 Units total) by mouth 4 (four) times daily. 200 mL 0  . ondansetron (ZOFRAN) 8 MG tablet Take 1 tablet (8 mg total) by mouth every 8 (eight) hours as needed  for nausea or vomiting. 20 tablet 0  . oxyCODONE-acetaminophen (PERCOCET/ROXICET) 5-325 MG tablet Take 1 tablet by mouth every 6 (six) hours as needed for moderate pain or severe pain. 90 tablet 0  . pantoprazole (PROTONIX) 40 MG tablet Take 1 tablet (40 mg total) by mouth daily. 30 tablet 1  . polyethylene glycol (MIRALAX) 17 g packet Take 17 g by mouth daily. (Patient taking differently: Take 17 g by mouth daily as needed for mild constipation.) 30 each 1  . prochlorperazine (COMPAZINE) 10 MG tablet Take 1 tablet (10 mg total) by mouth every 6 (six) hours as needed for nausea or vomiting. 30 tablet 0  . senna-docusate (SENNA S) 8.6-50 MG tablet Take 2 tablets by mouth 2 (two) times daily. 60 tablet 2   No facility-administered medications prior to visit.    No Known Allergies  ROS Review of Systems  Constitutional: Negative.   HENT: Negative.   Eyes: Negative.   Respiratory: Negative.   Cardiovascular: Positive for leg swelling. Negative for chest pain and palpitations.  Gastrointestinal: Negative.   Genitourinary: Negative.   Musculoskeletal: Negative.   Skin: Negative.   Neurological: Negative.   Psychiatric/Behavioral: Negative.   All other systems reviewed and are negative.     Objective:    Physical Exam Vitals and nursing note reviewed.  Constitutional:      General: She is not in acute distress.    Appearance: Normal appearance. She is normal weight. She is not ill-appearing, toxic-appearing or diaphoretic.  Cardiovascular:     Rate and Rhythm: Normal rate and regular rhythm.     Heart sounds: Normal heart sounds. No murmur heard. No friction rub. No gallop.   Pulmonary:     Effort: Pulmonary effort is normal. No respiratory distress.     Breath sounds: Normal breath sounds. No stridor. No wheezing, rhonchi or rales.  Chest:     Chest wall: No tenderness.  Musculoskeletal:     Right lower leg: Edema (1+ pitting edema. no redness, tenderness, or heat.) present.      Left lower leg: Edema (1+ pitting edema. no redness, tenderness, or heat) present.  Skin:    General: Skin is warm and dry.  Neurological:     General: No focal deficit present.     Mental Status: She is alert and oriented to person, place, and time. Mental status is at baseline.  Psychiatric:        Mood and Affect: Mood normal.        Behavior: Behavior normal.  Thought Content: Thought content normal.        Judgment: Judgment normal.     BP 132/70   Pulse 64   Temp 98.2 F (36.8 C) (Temporal)   Resp 16   Ht _0  (1.651 m)   Wt 142 lb 4.8 oz (64.5 kg)   SpO2 96%   BMI 23.68 kg/m  Wt Readings from Last 3 Encounters:  10/02/20 142 lb 4.8 oz (64.5 kg)  09/24/20 144 lb 1.6 oz (65.4 kg)  09/10/20 141 lb 11.2 oz (64.3 kg)     Health Maintenance Due  Topic Date Due  . COLONOSCOPY (Pts 45-48yr Insurance coverage will need to be confirmed)  Never done  . MAMMOGRAM  Never done  . DEXA SCAN  Never done  . PNA vac Low Risk Adult (1 of 2 - PCV13) Never done    There are no preventive care reminders to display for this patient.  Lab Results  Component Value Date   TSH 0.531 06/04/2020   Lab Results  Component Value Date   WBC 3.4 (L) 09/24/2020   HGB 11.1 (L) 09/24/2020   HCT 34.3 (L) 09/24/2020   MCV 95.5 09/24/2020   PLT 85 (L) 09/24/2020   Lab Results  Component Value Date   NA 143 09/24/2020   K 3.9 09/24/2020   CO2 26 09/24/2020   GLUCOSE 93 09/24/2020   BUN 12 09/24/2020   CREATININE 0.77 09/24/2020   BILITOT 0.5 09/24/2020   ALKPHOS 77 09/24/2020   AST 8 (L) 09/24/2020   ALT <6 09/24/2020   PROT 5.5 (L) 09/24/2020   ALBUMIN 3.3 (L) 09/24/2020   CALCIUM 7.3 (L) 09/24/2020   ANIONGAP 10 09/24/2020   No results found for: CHOL No results found for: HDL No results found for: LDLCALC No results found for: TRIG No results found for: CHOLHDL No results found for: HGBA1C    Assessment & Plan:   Problem List Items Addressed This Visit    None   Visit Diagnoses    Dependent edema    -  Primary   Relevant Medications   hydrochlorothiazide (HYDRODIURIL) 12.5 MG tablet   Other Relevant Orders   EKG 12-Lead (Completed)   Brain natriuretic peptide   CBC with Differential/Platelet   D-dimer, quantitative (not at [facility]      No orders of the defined types were placed in this encounter.   Follow-up: No follow-ups on file.   PLAN  EKG compared to 06/09/20. No acute changes. No concerns on EKG. Essentially normal findings.  Will draw bnp, cbc, d-dimer to rule out risk for chf or dvt. Low suspicion for either.  Suspect low activity and perhaps some PVD. Encourage watching intake and output and staying active with mild to moderate exercise as tolerated for 30 minutes daily 4-5 days each week  Follow up after PET CT and bone marrow biopsy are in  Patient encouraged to call clinic with any questions, comments, or concerns.  RMaximiano Coss NP

## 2020-10-03 ENCOUNTER — Ambulatory Visit (HOSPITAL_COMMUNITY)
Admission: RE | Admit: 2020-10-03 | Discharge: 2020-10-03 | Disposition: A | Discharge status: Home / Self Care | Payer: Medicare Other | Source: Ambulatory Visit | Attending: Hematology | Admitting: Hematology

## 2020-10-03 ENCOUNTER — Other Ambulatory Visit: Payer: Self-pay

## 2020-10-03 ENCOUNTER — Encounter (HOSPITAL_COMMUNITY): Payer: Self-pay

## 2020-10-03 DIAGNOSIS — Z791 Long term (current) use of non-steroidal anti-inflammatories (NSAID): Secondary | ICD-10-CM | POA: Diagnosis not present

## 2020-10-03 DIAGNOSIS — Z981 Arthrodesis status: Secondary | ICD-10-CM | POA: Diagnosis not present

## 2020-10-03 DIAGNOSIS — Z79899 Other long term (current) drug therapy: Secondary | ICD-10-CM | POA: Insufficient documentation

## 2020-10-03 DIAGNOSIS — C9000 Multiple myeloma not having achieved remission: Secondary | ICD-10-CM | POA: Diagnosis present

## 2020-10-03 DIAGNOSIS — D649 Anemia, unspecified: Secondary | ICD-10-CM | POA: Insufficient documentation

## 2020-10-03 DIAGNOSIS — Z5111 Encounter for antineoplastic chemotherapy: Secondary | ICD-10-CM

## 2020-10-03 DIAGNOSIS — Z809 Family history of malignant neoplasm, unspecified: Secondary | ICD-10-CM | POA: Insufficient documentation

## 2020-10-03 DIAGNOSIS — Z7982 Long term (current) use of aspirin: Secondary | ICD-10-CM | POA: Diagnosis not present

## 2020-10-03 DIAGNOSIS — Z923 Personal history of irradiation: Secondary | ICD-10-CM | POA: Insufficient documentation

## 2020-10-03 DIAGNOSIS — D72819 Decreased white blood cell count, unspecified: Secondary | ICD-10-CM | POA: Diagnosis not present

## 2020-10-03 LAB — CBC WITH DIFFERENTIAL/PLATELET
Abs Immature Granulocytes: 0.01 10*3/uL (ref 0.00–0.07)
Basophils Absolute: 0.1 10*3/uL (ref 0.0–0.1)
Basophils Relative: 2 %
Eosinophils Absolute: 0.1 10*3/uL (ref 0.0–0.5)
Eosinophils Relative: 4 %
HCT: 35.8 % — ABNORMAL LOW (ref 36.0–46.0)
Hemoglobin: 11.8 g/dL — ABNORMAL LOW (ref 12.0–15.0)
Immature Granulocytes: 0 %
Lymphocytes Relative: 22 %
Lymphs Abs: 0.6 10*3/uL — ABNORMAL LOW (ref 0.7–4.0)
MCH: 31.9 pg (ref 26.0–34.0)
MCHC: 33 g/dL (ref 30.0–36.0)
MCV: 96.8 fL (ref 80.0–100.0)
Monocytes Absolute: 0.5 10*3/uL (ref 0.1–1.0)
Monocytes Relative: 17 %
Neutro Abs: 1.6 10*3/uL — ABNORMAL LOW (ref 1.7–7.7)
Neutrophils Relative %: 55 %
Platelets: 270 10*3/uL (ref 150–400)
RBC: 3.7 MIL/uL — ABNORMAL LOW (ref 3.87–5.11)
RDW: 16.3 % — ABNORMAL HIGH (ref 11.5–15.5)
WBC: 2.9 10*3/uL — ABNORMAL LOW (ref 4.0–10.5)
nRBC: 0 % (ref 0.0–0.2)

## 2020-10-03 LAB — D-DIMER, QUANTITATIVE: D-Dimer, Quant: 0.59 mcg/mL FEU — ABNORMAL HIGH (ref <0.50)

## 2020-10-03 MED ORDER — LIDOCAINE HCL (PF) 1 % IJ SOLN
INTRAMUSCULAR | Status: AC | PRN
  Administered 2020-10-03 (×2): 10 mL via INTRADERMAL

## 2020-10-03 MED ORDER — MIDAZOLAM HCL 2 MG/2ML IJ SOLN
INTRAMUSCULAR | Status: AC | PRN
  Administered 2020-10-03: 1 mg via INTRAVENOUS
  Administered 2020-10-03 (×4): 0.5 mg via INTRAVENOUS
  Administered 2020-10-03: 1 mg via INTRAVENOUS

## 2020-10-03 MED ORDER — MIDAZOLAM HCL 2 MG/2ML IJ SOLN
INTRAMUSCULAR | Status: AC
  Filled 2020-10-03: qty 2

## 2020-10-03 MED ORDER — FENTANYL CITRATE (PF) 100 MCG/2ML IJ SOLN
INTRAMUSCULAR | Status: AC | PRN
  Administered 2020-10-03 (×2): 50 ug via INTRAVENOUS

## 2020-10-03 MED ORDER — HYDROCODONE-ACETAMINOPHEN 5-325 MG PO TABS: 1.0000 | ORAL_TABLET | ORAL | Status: DC | PRN

## 2020-10-03 MED ORDER — SODIUM CHLORIDE 0.9 % IV SOLN: INTRAVENOUS | Status: DC

## 2020-10-03 MED ORDER — FENTANYL CITRATE (PF) 100 MCG/2ML IJ SOLN
INTRAMUSCULAR | Status: AC
  Filled 2020-10-03: qty 2

## 2020-10-03 NOTE — Discharge Instructions (Signed)
Urgent needs - Interventional Radiology on call MD [phone]  Wound - May remove dressing and shower tomorrow.  Keep site clean and dry.  Replace with bandaid as needed.  Do not submerge in tub or water until site healing well.    Bone Marrow Aspiration and Bone Marrow Biopsy, Adult, Care After This sheet gives you information about how to care for yourself after your procedure. Your health care provider may also give you more specific instructions. If you have problems or questions, contact your health care provider. What can I expect after the procedure? After the procedure, it is common to have:  Mild pain and tenderness.  Swelling.  Bruising. Follow these instructions at home: Puncture site care  Follow instructions from your health care provider about how to take care of the puncture site. Make sure you: ? Wash your hands with soap and water before and after you change your bandage (dressing). If soap and water are not available, use hand sanitizer. ? Change your dressing as told by your health care provider.  Check your puncture site every day for signs of infection. Check for: ? More redness, swelling, or pain. ? Fluid or blood. ? Warmth. ? Pus or a bad smell.   Activity  Return to your normal activities as told by your health care provider. Ask your health care provider what activities are safe for you.  Do not lift anything that is heavier than 10 lb (4.5 kg), or the limit that you are told, until your health care provider says that it is safe.  Do not drive for 24 hours if you were given a sedative during your procedure. General instructions  Take over-the-counter and prescription medicines only as told by your health care provider.  Do not take baths, swim, or use a hot tub until your health care provider approves. Ask your health care provider if you may take showers. You may only be allowed to take sponge baths.  If directed, put ice on the affected area. To do  this: ? Put ice in a plastic bag. ? Place a towel between your skin and the bag. ? Leave the ice on for 20 minutes, 2-3 times a day.  Keep all follow-up visits as told by your health care provider. This is important.   Contact a health care provider if:  Your pain is not controlled with medicine.  You have a fever.  You have more redness, swelling, or pain around the puncture site.  You have fluid or blood coming from the puncture site.  Your puncture site feels warm to the touch.  You have pus or a bad smell coming from the puncture site. Summary  After the procedure, it is common to have mild pain, tenderness, swelling, and bruising.  Follow instructions from your health care provider about how to take care of the puncture site and what activities are safe for you.  Take over-the-counter and prescription medicines only as told by your health care provider.  Contact a health care provider if you have any signs of infection, such as fluid or blood coming from the puncture site. This information is not intended to replace advice given to you by your health care provider. Make sure you discuss any questions you have with your health care provider. Document Revised: 11/01/2018 Document Reviewed: 11/01/2018 Elsevier Patient Education  2021 [employer].   Moderate Conscious Sedation, Adult, Care After This sheet gives you information about how to care for yourself after your procedure.  Your health care provider may also give you more specific instructions. If you have problems or questions, contact your health care provider. What can I expect after the procedure? After the procedure, it is common to have:  Sleepiness for several hours.  Impaired judgment for several hours.  Difficulty with balance.  Vomiting if you eat too soon. Follow these instructions at home: For the time period you were told by your health care provider:  Rest.  Do not participate in activities  where you could fall or become injured.  Do not drive or use machinery.  Do not drink alcohol.  Do not take sleeping pills or medicines that cause drowsiness.  Do not make important decisions or sign legal documents.  Do not take care of children on your own.      Eating and drinking  Follow the diet recommended by your health care provider.  Drink enough fluid to keep your urine pale yellow.  If you vomit: ? Drink water, juice, or soup when you can drink without vomiting. ? Make sure you have little or no nausea before eating solid foods.   General instructions  Take over-the-counter and prescription medicines only as told by your health care provider.  Have a responsible adult stay with you for the time you are told. It is important to have someone help care for you until you are awake and alert.  Do not smoke.  Keep all follow-up visits as told by your health care provider. This is important. Contact a health care provider if:  You are still sleepy or having trouble with balance after 24 hours.  You feel light-headed.  You keep feeling nauseous or you keep vomiting.  You develop a rash.  You have a fever.  You have redness or swelling around the IV site. Get help right away if:  You have trouble breathing.  You have new-onset confusion at home. Summary  After the procedure, it is common to feel sleepy, have impaired judgment, or feel nauseous if you eat too soon.  Rest after you get home. Know the things you should not do after the procedure.  Follow the diet recommended by your health care provider and drink enough fluid to keep your urine pale yellow.  Get help right away if you have trouble breathing or new-onset confusion at home. This information is not intended to replace advice given to you by your health care provider. Make sure you discuss any questions you have with your health care provider. Document Revised: 10/13/2019 Document Reviewed:  05/11/2019 Elsevier Patient Education  2021 [employer].

## 2020-10-03 NOTE — H&P (Signed)
Chief Complaint: Patient was seen in consultation today for multiple myeloma/bone marrow biopsy and aspiration.  Referring Physician(s): Brunetta Genera (oncology)  Supervising Physician: Markus Daft  Patient Status: [facility] - Out-pt  History of Present Illness: Lisa Lester is a 75 y.o. 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 In 2021, patient underwent bone marrow biopsy in IR which confirmed multiple myeloma. She also was found to have C1 pathological fracture s/p palliative radiation.  IR consulted by Dr. Irene Limbo for possible image-guided bone marrow biopsy/aspiration to evaluate multiple myeloma progression. Patient awake and alert sitting in bed. Complains of neck pain secondary to C1 fracture, rated 6/10 at this time. Soft c-collar in place. Denies fever, chills, chest pain, dyspnea, abdominal pain, or headache.   Past Medical History:  Diagnosis Date  . Multiple myeloma ([place])   . Pathologic fracture    L3  . Plasmacytoma [facility])     Past Surgical History:  Procedure Laterality Date  . BACK SURGERY    . BREAST ENHANCEMENT SURGERY Bilateral   . LUMBAR FUSION     L2 - L4, hx of pathologic fx of L3  . RADIOLOGY WITH ANESTHESIA N/A 06/13/2020   Procedure: MRI BRAIN WITHOUT; C-SPINE WITHOUT;  Surgeon: Radiologist, Medication, MD;  Location: [place];  Service: Radiology;  Laterality: N/A;    Allergies: Patient has no known allergies.  Medications: Prior to Admission medications   Medication Sig Start Date End Date Taking? Authorizing Provider  Cyanocobalamin (B-12) 1000 MCG SUBL Place 1,000 mcg under the tongue daily. 07/05/20  Yes Brunetta Genera, MD  cyclobenzaprine (FLEXERIL) 5 MG tablet Take 1 tablet (5 mg total) by mouth 3  (three) times daily as needed for muscle spasms. 09/30/20  Yes Brunetta Genera, MD  furosemide (LASIX) 20 MG tablet Take 1 tablet (20 mg total) by mouth daily. 09/30/20  Yes Brunetta Genera, MD  meloxicam (MOBIC) 15 MG tablet TAKE 1 TABLET BY MOUTH EVERY DAY 09/19/20  Yes [name], NP  oxyCODONE-acetaminophen (PERCOCET/ROXICET) 5-325 MG tablet Take 1 tablet by mouth every 6 (six) hours as needed for moderate pain or severe pain. 09/18/20  Yes Brunetta Genera, MD  acyclovir (ZOVIRAX) 400 MG tablet Take 400 mg by mouth 2 (two) times daily.    [provider]  ALPRAZolam Duanne Moron) 0.5 MG tablet Take 1 tablet (0.5 mg total) by mouth 3 (three) times daily as needed for anxiety. 07/01/20   [name], NP  aspirin EC 81 MG tablet Take 81 mg by mouth daily. Swallow whole.    [provider]  DULoxetine (CYMBALTA) 30 MG capsule TAKE 1 CAPSULE BY MOUTH EVERY DAY 07/23/20   Brunetta Genera, MD  ergocalciferol (VITAMIN D2) 1.25 MG (50000 UT) capsule Take 1 capsule (50,000 Units total) by mouth 2 (two) times a week. 08/19/20   Brunetta Genera, MD  fentaNYL (DURAGESIC) 25 MCG/HR Place 1 patch onto the skin every 3 (three) days. 05/28/20   Brunetta Genera, MD  hydrochlorothiazide (HYDRODIURIL) 12.5 MG tablet Take 1 tablet (12.5 mg total) by mouth daily. 10/02/20   [name], NP  lenalidomide (REVLIMID) 15 MG capsule TAKE 1 CAPSULE BY MOUTH  DAILY FOR 14 DAYS, THEN 7  DAYS OFF 09/19/20   Brunetta Genera, MD  lidocaine (LIDODERM) 5 % Place 1 patch onto the skin daily. Remove & Discard patch within 12 hours or as directed by MD on the neck 06/27/20   Tyler Pita, MD  nystatin (MYCOSTATIN) 100000 UNIT/ML suspension Take 5 mLs (500,000 Units total) by mouth 4 (four) times daily. 07/05/20   Brunetta Genera, MD  ondansetron (ZOFRAN) 8 MG tablet Take 1 tablet (8 mg total) by mouth every 8 (eight) hours as needed for nausea or vomiting. 08/27/20   Brunetta Genera, MD  pantoprazole (PROTONIX) 40 MG tablet Take 1 tablet (40 mg total) by mouth daily. 06/18/20 06/18/21  Pokhrel, Corrie Mckusick, MD  polyethylene glycol (MIRALAX) 17 g packet Take 17 g by mouth daily. Patient taking differently: Take 17 g by mouth daily as needed for mild constipation. 05/07/20   Brunetta Genera, MD  prochlorperazine (COMPAZINE) 10 MG tablet Take 1 tablet (10 mg total) by mouth every 6 (six) hours as needed for nausea or vomiting. 08/28/20   Brunetta Genera, MD  senna-docusate (SENNA S) 8.6-50 MG tablet Take 2 tablets by mouth 2 (two) times daily. 06/18/20   Pokhrel, Corrie Mckusick, MD     Family History  Problem Relation Age of Onset  . Cancer - Other Mother   . Cancer - Other Father     Social History   Socioeconomic History  . Marital status: Widowed    Spouse name: russell Cervi  . Number of children: 0  . Years of education: 55  . Highest education level: 12th grade  Occupational History  . Not on file  Tobacco Use  . Smoking status: Never Smoker  . Smokeless tobacco: Never Used  Vaping Use  . Vaping Use: Never used  Substance and Sexual Activity  . Alcohol use: Never  . Drug use: Never  . Sexual activity: Not Currently  Other Topics Concern  . Not on file  Social History Narrative  . Not on file   Social Determinants of Health   Financial Resource Strain: Not on file  Food Insecurity: Not on file  Transportation Needs: Not on file  Physical Activity: Not on file  Stress: Not on file  Social Connections: Not on file     Review of Systems: A 12 point ROS discussed and pertinent positives are indicated in the HPI above.  All other systems are negative.  Review of Systems  Constitutional: Negative for chills and fever.  Respiratory: Negative for shortness of breath and wheezing.   Cardiovascular: Negative for chest pain and palpitations.  Gastrointestinal: Negative for abdominal pain.  Musculoskeletal: Positive for neck pain.  Neurological:  Negative for headaches.  Psychiatric/Behavioral: Negative for behavioral problems and confusion.    Vital Signs: BP (!) 143/99   Pulse 94   Temp 99 F (37.2 C) (Oral)   Resp 18   Ht '5\' 5"'  (1.651 m)   Wt 142 lb 4.8 oz (64.5 kg)   SpO2 98%   BMI 23.68 kg/m   Physical Exam Vitals and nursing note reviewed.  Constitutional:      General: She is not in acute distress. Neck:     Comments: (+) soft c-collar. Cardiovascular:     Rate and Rhythm: Normal rate and regular rhythm.     Heart sounds: Normal heart sounds. No murmur heard.   Pulmonary:     Effort: Pulmonary effort is normal. No respiratory distress.     Breath sounds: Normal breath sounds. No wheezing.  Skin:    General: Skin is warm  and dry.  Neurological:     Mental Status: She is alert and oriented to person, place, and time.      MD Evaluation Airway: WNL Heart: WNL Abdomen: WNL Chest/ Lungs: WNL ASA  Classification: 3 Mallampati/Airway Score: Two   Imaging: NM PET Image Restage (PS) Whole Body  Result Date: 10/02/2020 CLINICAL DATA:  Subsequent treatment strategy for multiple myeloma. EXAM: NUCLEAR MEDICINE PET WHOLE BODY TECHNIQUE: 7.27 mCi F-18 FDG was injected intravenously. Full-ring PET imaging was performed from the head to foot after the radiotracer. CT data was obtained and used for attenuation correction and anatomic localization. Fasting blood glucose: 94 mg/dl COMPARISON:  05/24/2020 FINDINGS: Mediastinal blood pool activity: SUV max 2.4 HEAD/NECK: No hypermetabolic activity in the scalp. No hypermetabolic cervical lymph nodes. Incidental CT findings: none CHEST: No hypermetabolic mediastinal or hilar nodes. No suspicious pulmonary nodules on the CT scan. Incidental CT findings: Small right pleural effusion. Aortic atherosclerosis. Coronary artery calcifications. Bilateral breast augmentation. ABDOMEN/PELVIS: No abnormal hypermetabolic activity within the liver, pancreas, adrenal glands, or spleen. No  hypermetabolic lymph nodes in the abdomen or pelvis. Incidental CT findings: Aortic atherosclerosis. No aneurysm. Right lobe of liver cyst, image 123/4. Distal colonic diverticulosis noted. No acute inflammation. SKELETON: Index lesion involving the left lateral mass of C1 has an SUV max of 6.7. Previously 15.6. Index lesion involving the C4 vertebral body has an SUV max of 2.06. Previously 6.4. Index lesion involving the lower sternum with associated pathologic fracture has an SUV max of 2.31. Previously 6.65. Index lesion involving the left fifth rib has an SUV max of 2.04 on today's study. Previously 9.4. Incidental CT findings: none EXTREMITIES: Index lesion involving the distal right humeral shaft has an SUV max of 0.98. Previously 3.9. Subcutaneous nodule within the anterior fat of the mid right thigh measures 1.6 cm within SUV max of 1.46. Previously this measured 1.3 cm with SUV max of 3.4. Incidental CT findings: none IMPRESSION: 1. Interval response to therapy. There has been significant interval decrease in the degree of FDG uptake associated with multiple lesions of myeloma. Persistent but improved uptake involving the lateral mass of C1 has an SUV max of 6.7 consistent with residual metabolically active disease. 2. No new or progressive sites of disease. 3. Aortic Atherosclerosis (ICD10-I70.0). Coronary artery calcifications. Electronically Signed   By: Kerby Moors M.D.   On: 10/02/2020 16:11    Labs:  CBC: Recent Labs    09/16/20 1245 09/24/20 1434 10/02/20 1034 10/03/20 0737  WBC 3.4* 3.4* 2.3 Repeated and verified X2.* 2.9*  HGB 12.0 11.1* 11.2* 11.8*  HCT 36.6 34.3* 32.9* 35.8*  PLT 127* 85* 297.0 270    COAGS: Recent Labs    05/27/20 0730  INR 0.9    BMP: Recent Labs    08/26/20 1513 09/10/20 1334 09/16/20 1245 09/24/20 1434  NA 137 139 136 143  K 3.8 5.0 4.3 3.9  CL 106 106 102 107  CO2 '23 28 25 26  ' GLUCOSE 113* 107* 101* 93  BUN '9 9 16 12  ' CALCIUM 8.2*  8.4* 8.7* 7.3*  CREATININE 0.83 0.77 0.84 0.77  GFRNONAA >60 >60 >60 >60    LIVER FUNCTION TESTS: Recent Labs    08/26/20 1513 09/10/20 1334 09/16/20 1245 09/24/20 1434  BILITOT 0.4 0.3 0.4 0.5  AST 11* 9* 10* 8*  ALT '7 7 8 ' <6  ALKPHOS 102 82 78 77  PROT 5.7* 5.9* 6.0* 5.5*  ALBUMIN 3.2* 3.4* 3.5 3.3*  Assessment and Plan:  Multiple myeloma with associated C1 lesion s/p palliative radiation. Plan for image-guided bone marrow biopsy/aspiration today in IR. Patient is NPO. Afebrile. CBC with differential ordered this AM.  Risks and benefits discussed with the patient including, but not limited to bleeding, infection, damage to adjacent structures or low yield requiring additional tests. All of the patient's questions were answered, patient is agreeable to proceed. Consent signed and in chart.   Thank you for this interesting consult.  I greatly enjoyed meeting Lisa Lester and look forward to participating in their care.  A copy of this report was sent to the requesting provider on this date.  Electronically Signed: Earley Abide, PA-C 10/03/2020, 8:19 AM   I spent a total of 25 Minutes in face to face in clinical consultation, greater than 50% of which was counseling/coordinating care for multiple myeloma/bone marrow biopsy and aspiration.

## 2020-10-03 NOTE — Procedures (Signed)
Interventional Radiology Procedure:   Indications: Myeloma  Procedure: CT guided bone marrow biopsy  Findings: 2 aspirates and 2 cores from right ilium  Complications: None     EBL: Minimal, less than 20 ml  Plan: Discharge to home in one hour.   Jonise Weightman R. Anselm Pancoast, MD  Pager: [phone]

## 2020-10-08 NOTE — Progress Notes (Signed)
HEMATOLOGY/ONCOLOGY CLINIC NOTE  Date of Service: 10/08/2020  Patient Care Team: [name], NP as PCP - General (Adult Health Nurse Practitioner)  CHIEF COMPLAINTS/PURPOSE OF CONSULTATION:  mx of myeloma  HISTORY OF PRESENTING ILLNESS:  Lisa Lester is a wonderful 75 y.o. female who has been referred to [place] by Dr. 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 Pt is accompanied today by her nephew. The pt reports that she is doing well overall.   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 To treat this the pt had a Kyphoplasty and radiation therapy. Pt also had a BM Bx, urinalysis, and a bone scan completed at the time. There was no official diagnosis of MGUS or Multiple Myeloma at this time, but they continued to monitor.   A few weeks ago ptt began having significant pain in her chest, which was thought to be a fractured sternum. Pt describes the pain as "flesh being ripped off of her bones". She was also having left-sided neck and back discomfort at this time. Pt was taken to the hospital where her pain was treated and she received a right Iliac Bx, BM Bx, and CT Chest while admitted. They did not complete any whole body imaging at this time.   She had a root canal nearly 1.5 months ago. Pt discontinued taking Thyroid replacement 5-6 years ago. Pt is also no longer taking any HTN medications. She was asked to restart Gabapentin yesterday, which she was given for pain control, but felt ill after taking it. She continues taking Xanax and Hydrocodone every 4 hours. Pt reports some constipation and takes Colace occasionally.   Pt had COVID19 in July of 2020. Her husband also had the virus which caused his passing. Pt has received two doses of the COVID19 vaccine but has not received the booster.  Of note since the patient's last visit, pt has had CT Chest ([id number]) completed on 04/09/2020 with  results revealing "1. Chronic posterior surgical fusion of L2 and L4 with bilateral transpedicular screws and bridging rods. Hardware intact appearing. 2. Status post vertebroplasty of L3. There is an underlying lytic lesion at the L3 vertebral body which extends into its left pedicle. These findings correlate with the provided history of a known plasmacytoma. 3. There are numerous lytic skeletal lesions at the chest, abdomen and pelvis, more numerous at the spine and ribs. 4. Multiple lytic lesions are present at the sternum, with interval development of a pathologic fracture at the sternum without significant displacement. 5. Stable mild superior endplate compression deformity at T11. 6. 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 Similar findings were present on prior study from 03/30/12, the degree of biliary ductal dilation has worsened as compared to the prior study. No choledochal stone identified.   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  Most recent lab results (04/11/2020) of CBC w/diff is as follows: all values are WNL except for Lymphs Rel  at 18.5. 04/10/2020 CMP is as follows: all values are WNL except for CO2 at 33, Glucose at 120, Calcium at 11.6, Anion gap at 3.  On review of systems, pt reports constipation, chest pain, neck pain, back pain and denies dysuria and any other symptoms.   On PMHx the pt reports Kyphoplasty,  Plasmacytoma, HTN, Thyroid disorder. On Social Hx the pt reports that she is a non-smoker and only drinks alcohol socially.   INTERVAL HISTORY:  Lisa Lester is a wonderful 75 y.o. 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 The patient's last visit with [place] was on 09/24/2020. The pt reports that she is doing well overall. We are joined today by her niece.  The pt reports no new symptoms but notes she has a bump on he leg. The pt notes it is improving and believes it was due to her dog jumping on her. The pt notes that the pain in her neck and head is not getting any better, potentially worsening. The pt still needs to take her pain medications constantly. The pt notes that is as controlled as it has been with the current regiment of fentanyl patch and Oxycodone every 6 hours. She notes that she would not be able to function without this.  Of note since the patient's last visit, pt has had CT Bm Bx on 10/03/2020, which revealed "- Normocellular bone marrow with erythroid-predominant trilineage hematopoiesis and no increase in plasma cells"  The pt has had PET Whole Body on 10/01/2020, which revealed "1. Interval response to therapy. There has been significant interval decrease in the degree of FDG uptake associated with multiple lesions of myeloma. Persistent but improved uptake involving the lateral mass of C1 has an SUV max of 6.7 consistent with residual metabolically active disease. 2. No new or progressive sites of disease. 3. Aortic Atherosclerosis (ICD10-I70.0). Coronary artery Calcifications."  Lab results today 10/09/2020 of CBC w/diff is as follows: all values are WNL except for WBC of 2.4K, RBC of 3.76, RDW of 15.7, Neutro Abs of 1.4K, Lymphs Abs of 0.6K, Potassium of 5.2, Glucose of 104, Creatinine of 1.02, Total Protein of 6.1, AST of 11, GFR est of 58.  On review of systems, pt reports neck and head pain, leg swelling and denies abdominal pain, fevers,  chills, and any other symptoms.  MEDICAL HISTORY:  Past Medical History:  Diagnosis Date  . Multiple myeloma ([place])   . Pathologic fracture    L3  . Plasmacytoma ([place])     SURGICAL HISTORY: Past Surgical History:  Procedure Laterality Date  . BACK SURGERY    . BREAST ENHANCEMENT SURGERY Bilateral   . LUMBAR FUSION     L2 - L4, hx of pathologic fx of L3  . RADIOLOGY WITH ANESTHESIA N/A 06/13/2020   Procedure: MRI BRAIN WITHOUT; C-SPINE WITHOUT;  Surgeon: Radiologist, Medication, MD;  Location: [facility];  Service: Radiology;  Laterality: N/A;    SOCIAL HISTORY: Social History   Socioeconomic History  . Marital status: Widowed    Spouse name: russell Boyadjian  . Number of children: 0  . Years of education: 91  . Highest education level: 12th grade  Occupational History  . Not on file  Tobacco Use  . Smoking status: Never Smoker  . Smokeless tobacco: Never Used  Vaping Use  . Vaping Use: Never used  Substance and Sexual Activity  . Alcohol use: Never  . Drug use: Never  . Sexual activity: Not Currently  Other Topics Concern  . Not on file  Social History Narrative  . Not on file   Social Determinants of Health   Financial Resource Strain: Not on file  Food Insecurity: Not on file  Transportation Needs: Not on file  Physical Activity: Not on file  Stress: Not on file  Social Connections: Not on file  Intimate Partner Violence: Not on file    FAMILY HISTORY: Family History  Problem Relation Age of Onset  . Cancer - Other Mother   . Cancer - Other Father     ALLERGIES:  has No Known Allergies.  MEDICATIONS:  Current Outpatient Medications  Medication Sig Dispense Refill  . acyclovir (ZOVIRAX) 400 MG tablet Take 400 mg by mouth 2 (two) times daily.    Marland Kitchen ALPRAZolam (XANAX) 0.5 MG tablet Take 1 tablet (0.5 mg total) by mouth 3 (three) times daily as needed for anxiety. 15 tablet 0  . aspirin EC 81 MG tablet Take 81 mg by mouth daily. Swallow whole.    .  Cyanocobalamin (B-12) 1000 MCG SUBL Place 1,000 mcg under the tongue daily. 60 tablet 2  . cyclobenzaprine (FLEXERIL) 5 MG tablet Take 1 tablet (5 mg total) by mouth 3 (three) times daily as needed for muscle spasms. 30 tablet 0  . DULoxetine (CYMBALTA) 30 MG capsule TAKE 1 CAPSULE BY MOUTH EVERY DAY 90 capsule 2  . ergocalciferol (VITAMIN D2) 1.25 MG (50000 UT) capsule Take 1 capsule (50,000 Units total) by mouth 2 (two) times a week. 16 capsule 2  . fentaNYL (DURAGESIC) 25 MCG/HR Place 1 patch onto the skin every 3 (three) days. 10 patch 0  . furosemide (LASIX) 20 MG tablet Take 1 tablet (20 mg total) by mouth daily. 30 tablet 0  . hydrochlorothiazide (HYDRODIURIL) 12.5 MG tablet Take 1 tablet (12.5 mg total) by mouth daily. 30 tablet 0  . lenalidomide (REVLIMID) 15 MG capsule TAKE 1 CAPSULE BY MOUTH  DAILY FOR 14 DAYS, THEN 7  DAYS OFF 14 capsule 0  . lidocaine (LIDODERM) 5 % Place 1 patch onto the skin daily. Remove & Discard patch within 12 hours or as directed by MD on the neck 30 patch 5  . meloxicam (MOBIC) 15 MG tablet TAKE 1 TABLET BY MOUTH EVERY DAY 30 tablet 0  . nystatin (MYCOSTATIN) 100000 UNIT/ML suspension Take 5 mLs (500,000 Units total) by mouth 4 (four) times daily. 200 mL 0  . ondansetron (ZOFRAN) 8 MG tablet Take 1 tablet (8 mg total) by mouth every 8 (eight) hours as needed for nausea or vomiting. 20 tablet 0  . oxyCODONE-acetaminophen (PERCOCET/ROXICET) 5-325 MG tablet Take 1 tablet by mouth every 6 (six) hours as needed for moderate pain or severe pain. 90 tablet 0  . pantoprazole (PROTONIX) 40 MG tablet Take 1 tablet (40 mg total) by mouth daily. 30 tablet 1  . polyethylene glycol (MIRALAX) 17 g packet Take 17 g by mouth daily. (Patient taking differently: Take 17 g by mouth daily as needed for mild constipation.) 30 each 1  . prochlorperazine (COMPAZINE) 10 MG tablet Take 1 tablet (10 mg total) by mouth every 6 (six) hours as needed for nausea or vomiting. 30 tablet 0  .  senna-docusate (SENNA S) 8.6-50 MG tablet Take 2 tablets by mouth 2 (two) times daily. 60 tablet 2   No current facility-administered medications for this visit.    REVIEW OF SYSTEMS:   10 Point review of Systems was done is negative except as noted above.  PHYSICAL EXAMINATION: ECOG PERFORMANCE STATUS: 2 - Symptomatic, <50% confined to bed  . Vitals:   10/09/20 0922  BP: 121/68  Pulse: 91  Resp: 16  Temp: (!) 97.5 F (36.4 C)  SpO2: 98%   Filed Weights   10/09/20 0922  Weight: 138 lb 9.6 oz (62.9 kg)   .Body mass index is 23.06 kg/m.  Exam was given in a wheelchair.  GENERAL:alert, in no acute distress and comfortable SKIN: no acute rashes, no significant lesions EYES: conjunctiva are pink and non-injected, sclera anicteric OROPHARYNX: MMM, no exudates, no oropharyngeal erythema or ulceration NECK: supple, no JVD LYMPH:  no palpable lymphadenopathy in the cervical, axillary or inguinal regions LUNGS: clear to auscultation b/l with normal respiratory effort HEART: regular rate & rhythm ABDOMEN:  normoactive bowel sounds , non tender, not distended. Extremity: minimal leg swelling PSYCH: alert & oriented x 3 with fluent speech NEURO: no focal motor/sensory deficits  LABORATORY DATA:  I have reviewed the data as listed  . CBC Latest Ref Rng & Units 10/03/2020 10/02/2020 09/24/2020  WBC 4.0 - 10.5 K/uL 2.9(L) 2.3 Repeated and verified X2.(L) 3.4(L)  Hemoglobin 12.0 - 15.0 g/dL 11.8(L) 11.2(L) 11.1(L)  Hematocrit 36.0 - 46.0 % 35.8(L) 32.9(L) 34.3(L)  Platelets 150 - 400 K/uL 270 297.0 85(L)    . CMP Latest Ref Rng & Units 09/24/2020 09/16/2020 09/10/2020  Glucose 70 - 99 mg/dL 93 101(H) 107(H)  BUN 8 - 23 mg/dL _0 Creatinine 0.44 - 1.00 mg/dL 0.77 0.84 0.77  Sodium 135 - 145 mmol/L 143 136 139  Potassium 3.5 - 5.1 mmol/L 3.9 4.3 5.0  Chloride 98 - 111 mmol/L 107 102 106  CO2 22 - 32 mmol/L _1 Calcium 8.9 - 10.3 mg/dL 7.3(L) 8.7(L) 8.4(L)  Total  Protein 6.5 - 8.1 g/dL 5.5(L) 6.0(L) 5.9(L)  Total Bilirubin 0.3 - 1.2 mg/dL 0.5 0.4 0.3  Alkaline Phos 38 - 126 U/L 77 78 82  AST 15 - 41 U/L 8(L) 10(L) 9(L)  ALT 0 - 44 U/L <_2 Component     Latest Ref Rng & Units 07/29/2020  IgG (Immunoglobin G), Serum     586 - 1,602 mg/dL 687  IgA     64 - 422 mg/dL 27 (L)  IgM (Immunoglobulin M), Srm     26 - 217 mg/dL 35  Total Protein ELP     6.0 - 8.5 g/dL 5.7 (L)  Albumin SerPl Elph-Mcnc     2.9 - 4.4 g/dL 3.4  Alpha 1     0.0 - 0.4 g/dL 0.3  Alpha2 Glob SerPl Elph-Mcnc     0.4 - 1.0 g/dL 0.8  B-Globulin SerPl Elph-Mcnc     0.7 - 1.3 g/dL 0.7  Gamma Glob SerPl Elph-Mcnc     0.4 - 1.8 g/dL 0.6  M Protein SerPl Elph-Mcnc     Not Observed g/dL 0.3 (H)  Globulin, Total     2.2 - 3.9 g/dL 2.3  Albumin/Glob SerPl     0.7 - 1.7 1.5  IFE 1      Comment (A)  Please Note (HCV):      Comment  Kappa free light chain     3.3 - 19.4 mg/L 21.0 (H)  Lamda free light chains     5.7 - 26.3 mg/L 14.7  Kappa, lamda light chain ratio     0.26 - 1.65 1.43    RADIOGRAPHIC STUDIES: I have personally reviewed the radiological images  as listed and agreed with the findings in the report. NM PET Image Restage (PS) Whole Body  Result Date: 10/02/2020 CLINICAL DATA:  Subsequent treatment strategy for multiple myeloma. EXAM: NUCLEAR MEDICINE PET WHOLE BODY TECHNIQUE: 7.27 mCi F-18 FDG was injected intravenously. Full-ring PET imaging was performed from the head to foot after the radiotracer. CT data was obtained and used for attenuation correction and anatomic localization. Fasting blood glucose: 94 mg/dl COMPARISON:  05/24/2020 FINDINGS: Mediastinal blood pool activity: SUV max 2.4 HEAD/NECK: No hypermetabolic activity in the scalp. No hypermetabolic cervical lymph nodes. Incidental CT findings: none CHEST: No hypermetabolic mediastinal or hilar nodes. No suspicious pulmonary nodules on the CT scan. Incidental CT findings: Small right pleural effusion.  Aortic atherosclerosis. Coronary artery calcifications. Bilateral breast augmentation. ABDOMEN/PELVIS: No abnormal hypermetabolic activity within the liver, pancreas, adrenal glands, or spleen. No hypermetabolic lymph nodes in the abdomen or pelvis. Incidental CT findings: Aortic atherosclerosis. No aneurysm. Right lobe of liver cyst, image [id number]. Distal colonic diverticulosis noted. No acute inflammation. SKELETON: Index lesion involving the left lateral mass of C1 has an SUV max of 6.7. Previously 15.6. Index lesion involving the C4 vertebral body has an SUV max of 2.06. Previously 6.4. Index lesion involving the lower sternum with associated pathologic fracture has an SUV max of 2.31. Previously 6.65. Index lesion involving the left fifth rib has an SUV max of 2.04 on today's study. Previously 9.4. Incidental CT findings: none EXTREMITIES: Index lesion involving the distal right humeral shaft has an SUV max of 0.98. Previously 3.9. Subcutaneous nodule within the anterior fat of the mid right thigh measures 1.6 cm within SUV max of 1.46. Previously this measured 1.3 cm with SUV max of 3.4. Incidental CT findings: none IMPRESSION: 1. Interval response to therapy. There has been significant interval decrease in the degree of FDG uptake associated with multiple lesions of myeloma. Persistent but improved uptake involving the lateral mass of C1 has an SUV max of 6.7 consistent with residual metabolically active disease. 2. No new or progressive sites of disease. 3. Aortic Atherosclerosis (ICD10-I70.0). Coronary artery calcifications. Electronically Signed   By: Kerby Moors M.D.   On: 10/02/2020 16:11   CT Biopsy  Result Date: 10/03/2020 INDICATION: 75 year old with history of myeloma. Bone marrow biopsy ordered to evaluate treatment response. EXAM: CT GUIDED BONE MARROW ASPIRATES AND BIOPSY Physician: Stephan Minister. Anselm Pancoast, MD MEDICATIONS: None. ANESTHESIA/SEDATION: Fentanyl 100 mcg IV; Versed 4.0 mg IV Moderate Sedation  Time: The patient was continuously monitored during the procedure by the interventional radiology nurse under my direct supervision. COMPLICATIONS: None immediate. PROCEDURE: The procedure was explained to the patient. The risks and benefits of the procedure were discussed and the patient's questions were addressed. Informed consent was obtained from the patient. Patient was placed in a right lateral decubitus position. Images of the pelvis were obtained. The lower back was prepped and draped in sterile fashion. The skin and right posterior ilium were anesthetized with 1% lidocaine. 11 gauge bone needle was directed into the right ilium with CT guidance. Two aspirates and two core biopsies were obtained. Bandage placed over the puncture site. FINDINGS: Biopsy needle successfully advanced into the posterior right ilium. IMPRESSION: CT guided bone marrow aspiration and core biopsy. Electronically Signed   By: Markus Daft M.D.   On: 10/03/2020 16:29   CT BONE MARROW BIOPSY & ASPIRATION  Result Date: 10/03/2020 INDICATION: 75 year old with history of myeloma. Bone marrow biopsy ordered to evaluate treatment response. EXAM: CT GUIDED BONE MARROW  ASPIRATES AND BIOPSY Physician: Stephan Minister. Anselm Pancoast, MD MEDICATIONS: None. ANESTHESIA/SEDATION: Fentanyl 100 mcg IV; Versed 4.0 mg IV Moderate Sedation Time: The patient was continuously monitored during the procedure by the interventional radiology nurse under my direct supervision. COMPLICATIONS: None immediate. PROCEDURE: The procedure was explained to the patient. The risks and benefits of the procedure were discussed and the patient's questions were addressed. Informed consent was obtained from the patient. Patient was placed in a right lateral decubitus position. Images of the pelvis were obtained. The lower back was prepped and draped in sterile fashion. The skin and right posterior ilium were anesthetized with 1% lidocaine. 11 gauge bone needle was directed into the right  ilium with CT guidance. Two aspirates and two core biopsies were obtained. Bandage placed over the puncture site. FINDINGS: Biopsy needle successfully advanced into the posterior right ilium. IMPRESSION: CT guided bone marrow aspiration and core biopsy. Electronically Signed   By: Markus Daft M.D.   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  Previous h/o plasmacytomas 05/15/2020 M Protein at 0.7 g/dL 05/24/2020 PET/CT ([id number]) revealed "1. There are multiple hypermetabolic lytic osseous lesions consistent with multiple myeloma. The most prominent lesion is within the left lateral mass of C1 and this likely involves the foramen transversarium. Probable T3 and sternal pathologic fractures."  2) C1 myeloma lesion status post palliative radiation with improvement in pain   PLAN: -Discussed pt labwork today, 10/09/2020; blood counts and chemistries stable. -Discussed pt CT Bm Bx 10/03/2020; bone marrow completely clean with less than 1% plasma cells. -Discussed pt PET Whole Body on 10/01/2020; no new findings and most lesions shrunk to normal. One spot in C1 that is decreased but still being read.  -Advised pt that it is less likely to be myeloma left in the C1 area due to treatment and radiation there. Most likely going to be inflammation due to radiation and prior fracture healing. -Advised pt that it would not be the best option to biopsy the area in C1. We will just continue to monitor. -Recommended pt f/u with her neurosurgeon to see if any other intervention might help with pan management.  -Advised pt that the goal with pain management is not to be completely pain free but to manage the pain and focus on immobilization and seeing what the neurosurgeon can do. -Discussed the role of adding adjuvants instead of increasing the narcotics. The pt notes the adjuvants in the  past made her feel very weird. -Recommended pt take Meloxicam daily for the next month to help with inflammation reduction. -Will Rx Lyrica to help with neuralgic-like pain and reduce pain medication needs. The pt is agreeable to this. -Advised pt that she can take Tylenol as needed as well. -Advised pt that we will keep Oxycodone at same dosage and pt can take 1-2 pills every six hours. Advised pt to try to attempt to just take 1 pill and not constantly take 2, but never more than every six hours. -Advised pt that neck-related PT needs clearance from neurosurgeon and to keep neck immobilized until then. -Recommended pt wear compression socks when sedimentary and elevate legs when laying down. -Recommended that the pt continue to eat well, drink at least 48-64 oz of water each day, and walk 20-30 minutes each day.  -Continue 1000 mcg Vitamin B12 & one B-complex daily.  -Continue daily baby  ASA and Acyclovir BID. -The pt has no prohibitive toxicities from continuing 15 mg maintenance Revlimid 3 weeks on, 1 week off at this time. -Continue Zometa q4weeks at this time. -Will see back in 1 month with labs and next Zometa infusion.   FOLLOW UP: Return to clinic with Dr. Irene Limbo with labs and next dose of Zometa in 1 month   The total time spent in the appointment was 40 minutes and more than 50% was on counseling and direct patient cares.   All of the patient's questions were answered with apparent satisfaction. The patient knows to call the clinic with any problems, questions or concerns.    Sullivan Lone MD [place] AAHIVMS [facility] Hematology/Oncology Physician [facility]  (Office):       [phone] (Work cell):  [phone] (Fax):           [phone]  10/08/2020 8:12 PM   I, Reinaldo Raddle, am acting as scribe for Dr. Sullivan Lone, MD.    .I have reviewed the above documentation for accuracy and completeness, and I agree with the above. Brunetta Genera MD

## 2020-10-09 ENCOUNTER — Inpatient Hospital Stay: Payer: Medicare Other | Attending: Hematology | Admitting: Hematology

## 2020-10-09 ENCOUNTER — Other Ambulatory Visit: Payer: Self-pay

## 2020-10-09 ENCOUNTER — Inpatient Hospital Stay: Payer: Medicare Other

## 2020-10-09 VITALS — BP 121/68 | HR 91 | Temp 97.5°F | Resp 16 | Ht 65.0 in | Wt 138.6 lb

## 2020-10-09 DIAGNOSIS — G893 Neoplasm related pain (acute) (chronic): Secondary | ICD-10-CM | POA: Diagnosis not present

## 2020-10-09 DIAGNOSIS — Z79899 Other long term (current) drug therapy: Secondary | ICD-10-CM | POA: Diagnosis not present

## 2020-10-09 DIAGNOSIS — Z5111 Encounter for antineoplastic chemotherapy: Secondary | ICD-10-CM

## 2020-10-09 DIAGNOSIS — I700 Atherosclerosis of aorta: Secondary | ICD-10-CM | POA: Diagnosis not present

## 2020-10-09 DIAGNOSIS — R519 Headache, unspecified: Secondary | ICD-10-CM | POA: Insufficient documentation

## 2020-10-09 DIAGNOSIS — R600 Localized edema: Secondary | ICD-10-CM | POA: Insufficient documentation

## 2020-10-09 DIAGNOSIS — M549 Dorsalgia, unspecified: Secondary | ICD-10-CM | POA: Insufficient documentation

## 2020-10-09 DIAGNOSIS — C9000 Multiple myeloma not having achieved remission: Secondary | ICD-10-CM | POA: Insufficient documentation

## 2020-10-09 DIAGNOSIS — K5900 Constipation, unspecified: Secondary | ICD-10-CM | POA: Insufficient documentation

## 2020-10-09 DIAGNOSIS — M542 Cervicalgia: Secondary | ICD-10-CM | POA: Diagnosis not present

## 2020-10-09 DIAGNOSIS — I10 Essential (primary) hypertension: Secondary | ICD-10-CM | POA: Insufficient documentation

## 2020-10-09 DIAGNOSIS — J90 Pleural effusion, not elsewhere classified: Secondary | ICD-10-CM | POA: Diagnosis not present

## 2020-10-09 DIAGNOSIS — Z791 Long term (current) use of non-steroidal anti-inflammatories (NSAID): Secondary | ICD-10-CM | POA: Insufficient documentation

## 2020-10-09 DIAGNOSIS — Z7982 Long term (current) use of aspirin: Secondary | ICD-10-CM | POA: Diagnosis not present

## 2020-10-09 LAB — CBC WITH DIFFERENTIAL/PLATELET
Abs Immature Granulocytes: 0.04 10*3/uL (ref 0.00–0.07)
Basophils Absolute: 0.1 10*3/uL (ref 0.0–0.1)
Basophils Relative: 3 %
Eosinophils Absolute: 0.2 10*3/uL (ref 0.0–0.5)
Eosinophils Relative: 7 %
HCT: 36.2 % (ref 36.0–46.0)
Hemoglobin: 12 g/dL (ref 12.0–15.0)
Immature Granulocytes: 2 %
Lymphocytes Relative: 25 %
Lymphs Abs: 0.6 10*3/uL — ABNORMAL LOW (ref 0.7–4.0)
MCH: 31.9 pg (ref 26.0–34.0)
MCHC: 33.1 g/dL (ref 30.0–36.0)
MCV: 96.3 fL (ref 80.0–100.0)
Monocytes Absolute: 0.2 10*3/uL (ref 0.1–1.0)
Monocytes Relative: 8 %
Neutro Abs: 1.4 10*3/uL — ABNORMAL LOW (ref 1.7–7.7)
Neutrophils Relative %: 55 %
Platelets: 233 10*3/uL (ref 150–400)
RBC: 3.76 MIL/uL — ABNORMAL LOW (ref 3.87–5.11)
RDW: 15.7 % — ABNORMAL HIGH (ref 11.5–15.5)
WBC: 2.4 10*3/uL — ABNORMAL LOW (ref 4.0–10.5)
nRBC: 0 % (ref 0.0–0.2)

## 2020-10-09 LAB — CMP (CANCER CENTER ONLY)
ALT: <6 U/L (ref 0–44)
AST: 11 U/L — ABNORMAL LOW (ref 15–41)
Albumin: 3.5 g/dL (ref 3.5–5.0)
Alkaline Phosphatase: 72 U/L (ref 38–126)
Anion gap: 11 (ref 5–15)
BUN: 13 mg/dL (ref 8–23)
CO2: 31 mmol/L (ref 22–32)
Calcium: 9 mg/dL (ref 8.9–10.3)
Chloride: 99 mmol/L (ref 98–111)
Creatinine: 1.02 mg/dL — ABNORMAL HIGH (ref 0.44–1.00)
GFR, Estimated: 58 mL/min — ABNORMAL LOW (ref >60)
Glucose, Bld: 104 mg/dL — ABNORMAL HIGH (ref 70–99)
Potassium: 5.2 mmol/L — ABNORMAL HIGH (ref 3.5–5.1)
Sodium: 141 mmol/L (ref 135–145)
Total Bilirubin: 0.4 mg/dL (ref 0.3–1.2)
Total Protein: 6.1 g/dL — ABNORMAL LOW (ref 6.5–8.1)

## 2020-10-09 MED ORDER — ERGOCALCIFEROL 1.25 MG (50000 UT) PO CAPS: 50000.0000 [IU] | ORAL_CAPSULE | ORAL | 2 refills | Status: AC

## 2020-10-09 MED ORDER — PREGABALIN 25 MG PO CAPS: 25.0000 mg | ORAL_CAPSULE | Freq: Two times a day (BID) | ORAL | 1 refills | Status: AC

## 2020-10-09 MED ORDER — B-12 1000 MCG SL SUBL: 1000.0000 ug | SUBLINGUAL_TABLET | Freq: Every day | SUBLINGUAL | 2 refills | Status: DC

## 2020-10-10 ENCOUNTER — Telehealth: Payer: Self-pay | Admitting: Hematology

## 2020-10-10 NOTE — Telephone Encounter (Signed)
Scheduled appt per 4/13 los - pt niece is aware of appt.

## 2020-10-15 ENCOUNTER — Encounter (HOSPITAL_COMMUNITY): Payer: Self-pay | Admitting: Hematology

## 2020-10-15 LAB — SURGICAL PATHOLOGY

## 2020-10-15 MED ORDER — OXYCODONE-ACETAMINOPHEN 5-325 MG PO TABS: 1.0000 | ORAL_TABLET | Freq: Four times a day (QID) | ORAL | 0 refills | Status: DC | PRN

## 2020-10-17 ENCOUNTER — Other Ambulatory Visit: Payer: Self-pay | Admitting: Registered Nurse

## 2020-10-17 ENCOUNTER — Encounter: Payer: Self-pay | Admitting: Hematology

## 2020-10-19 ENCOUNTER — Other Ambulatory Visit: Payer: Self-pay | Admitting: Hematology

## 2020-10-19 DIAGNOSIS — C9000 Multiple myeloma not having achieved remission: Secondary | ICD-10-CM

## 2020-10-21 ENCOUNTER — Other Ambulatory Visit: Payer: Self-pay

## 2020-10-22 ENCOUNTER — Other Ambulatory Visit: Payer: Self-pay | Admitting: Hematology

## 2020-10-22 DIAGNOSIS — Z7189 Other specified counseling: Secondary | ICD-10-CM

## 2020-10-22 DIAGNOSIS — C9000 Multiple myeloma not having achieved remission: Secondary | ICD-10-CM

## 2020-10-23 ENCOUNTER — Telehealth: Payer: Self-pay | Admitting: Hematology

## 2020-10-23 NOTE — Telephone Encounter (Signed)
Please review for refill thank you. 

## 2020-10-23 NOTE — Telephone Encounter (Signed)
Called pt to r/s appts per 4/27 sch msg. No answer. Left msg for pt to call back to r/s.

## 2020-10-27 ENCOUNTER — Other Ambulatory Visit: Payer: Self-pay | Admitting: Hematology

## 2020-11-04 ENCOUNTER — Other Ambulatory Visit: Payer: Self-pay

## 2020-11-04 ENCOUNTER — Other Ambulatory Visit: Payer: Medicare Other | Admitting: Hospice

## 2020-11-04 DIAGNOSIS — C9001 Multiple myeloma in remission: Secondary | ICD-10-CM

## 2020-11-04 DIAGNOSIS — Z515 Encounter for palliative care: Secondary | ICD-10-CM

## 2020-11-04 NOTE — Progress Notes (Signed)
[facility] Consult Note Telephone: [phone]  Fax: [phone]  PATIENT NAME: Lisa Lester DOB: 1946/02/06 MRN: [id number]  PRIMARY CARE PROVIDER:   [name], NP [name], NP [address] [place],  [place] [address]  REFERRING PROVIDER: [name], NP [name], NP [address] [place],  [place] [address]  RESPONSIBLE PARTY:Self [phone] HCPOA/Extended Emergency Contact Information Primary Emergency Contact: greer,doug Mobile Phone: [phone] Relation: Nephew Preferred language: English Interpreter needed? No Secondary Emergency Contact: greer,julie Mobile Phone: [phone] Relation: Niece Preferred language: English Interpreter needed? No [name] is a Database administrator Information    Name Relation Home Work Mobile   greer,julie Niece   [phone]   greer,doug Alanson Puls   [phone]     TELEHEALTH VISIT STATEMENT Due to the COVID-19 crisis, this visit was done via telemedicine and it was initiated and consent by this patient and or family.   Visit is to build trust and highlight Palliative Medicine as specialized medical care for people living with serious illness, aimed at facilitating better quality of life through symptoms relief, assisting with advance care planning and complex medical decision making.   RECOMMENDATIONS/PLAN:   Advance Care Planning/Code Status: Patient is a DO NOT RESUSCITATE  Goals of Care: Goals of care include to maximize quality of life and symptom management. She reports she is now in remission and wishes to go back to [place] by weekend.   Visit consisted of counseling and education dealing with the complex and emotionally intense issues of symptom management and palliative care in the setting of serious and potentially life-threatening illness.  Patient is upbeat today because of high remission status and help fight against cancer.  NP provided  therapeutic listening shared in her joy. Palliative care team will continue to support patient, patient's family, and medical team.  Symptom management/Plan:  Multiple myeloma: Patient has completed chemo/radiation.  Now in remission.  Surveillance as planned. Anxiety: Continue slow deep breathing, reframing and de-escalation techniques.  Xanax as ordered. Pain: Pain in neck currently managed with fentanyl patch and oxycodone. Follow up: Palliative care will continue to follow for complex medical decision making, advance care planning, and clarification of goals. Return 6 weeks or prn. Encouraged to call provider sooner with any concerns.  CHIEF COMPLAINT: Palliative follow up visit/PET scan and bone marrow came back clean- patient is in remission. Chemo/radiation is completed. Patient plans to go back in [place] and will continue with surveillance there.   HISTORY OF PRESENT ILLNESS: Lisa Lester is a 75 y.o. female with multiple medical problems including  multiple myeloma which is now in remission.  Patient reports she has completed chemo/radiation and that her recent PET scan and bone marrow biopsy came back negative.  She expressed appreciation for quality care that she received; she is happy and upbeat that she is now in remission; that she can go back to [place].  Patient with history of generalized body pain, anxiety, depression.History obtained from review of EMR, discussion with primary team, family, caregiver  and/or patient. Records reviewed and summarized above. All 10 point systems reviewed and are negative except as documented in history of present illness above  Review and summarization of Epic records shows history from other than patient.   Palliative Care was asked to follow this patient by consultation request of [name], NP to help address complex decision making in the context of advance care planning and goals of care  clarification.   PPS: 60% ROS  Constitution:  Denies fever/chills EYES: denies vision changes ENMT: denies Xerostomia,  dysphagia Cardiovascular: denies chest pain Pulmonary: denies  cough, denies dyspnea  Abdomen: endorses fair appetite, denies constipation or diarrhea GU: denies dysuria MSK:  Ambulatory, no falls reported Skin: denies rashes/bruising Neurological: endorses weakness, occasional pain to neck, denies insomnia Psych: Endorses positive mood; anxiety improved since use of high remission from multiple myeloma Heme/lymph/immuno: denies bruises, no abnormal bleeding   PERTINENT MEDICATIONS:  Outpatient Encounter Medications as of 11/04/2020  Medication Sig  . acyclovir (ZOVIRAX) 400 MG tablet TAKE 1 TABLET BY MOUTH TWICE A DAY  . ALPRAZolam (XANAX) 0.5 MG tablet Take 1 tablet (0.5 mg total) by mouth 3 (three) times daily as needed for anxiety.  Marland Kitchen aspirin EC 81 MG tablet Take 81 mg by mouth daily. Swallow whole.  . Cyanocobalamin (B-12) 1000 MCG SUBL Place 1,000 mcg under the tongue daily.  . cyclobenzaprine (FLEXERIL) 5 MG tablet Take 1 tablet (5 mg total) by mouth 3 (three) times daily as needed for muscle spasms.  . ergocalciferol (VITAMIN D2) 1.25 MG (50000 UT) capsule Take 1 capsule (50,000 Units total) by mouth 2 (two) times a week.  . fentaNYL (DURAGESIC) 25 MCG/HR Place 1 patch onto the skin every 3 (three) days.  . furosemide (LASIX) 20 MG tablet TAKE 1 TABLET BY MOUTH EVERY DAY  . hydrochlorothiazide (HYDRODIURIL) 12.5 MG tablet Take 1 tablet (12.5 mg total) by mouth daily.  Marland Kitchen lenalidomide (REVLIMID) 15 MG capsule TAKE 1 CAPSULE BY MOUTH  DAILY FOR 14 DAYS, THEN 7  DAYS OFF  . meloxicam (MOBIC) 15 MG tablet TAKE 1 TABLET BY MOUTH EVERY DAY  . ondansetron (ZOFRAN) 8 MG tablet Take 1 tablet (8 mg total) by mouth every 8 (eight) hours as needed for nausea or vomiting.  Marland Kitchen oxyCODONE-acetaminophen (PERCOCET/ROXICET) 5-325 MG tablet Take 1-2 tablets by mouth every 6 (six) hours as needed for moderate pain or severe pain.   . pantoprazole (PROTONIX) 40 MG tablet Take 1 tablet (40 mg total) by mouth daily.  . polyethylene glycol (MIRALAX) 17 g packet Take 17 g by mouth daily. (Patient taking differently: Take 17 g by mouth daily as needed for mild constipation.)  . pregabalin (LYRICA) 25 MG capsule Take 1 capsule (25 mg total) by mouth 2 (two) times daily.  . prochlorperazine (COMPAZINE) 10 MG tablet Take 1 tablet (10 mg total) by mouth every 6 (six) hours as needed for nausea or vomiting.  . senna-docusate (SENNA S) 8.6-50 MG tablet Take 2 tablets by mouth 2 (two) times daily.   No facility-administered encounter medications on file as of 11/04/2020.    HOSPICE ELIGIBILITY/DIAGNOSIS: TBD  PAST MEDICAL HISTORY:  Past Medical History:  Diagnosis Date  . Multiple myeloma ([place])   . Pathologic fracture    L3  . 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 Cancer - Other Mother   . Cancer - Other Father     ALLERGIES: No Known Allergies    I spent 50  minutes providing this consultation; this includes time spent with patient/family, chart review and documentation. More than 50% of the time in this consultation was spent on counseling and coordinating communication   Thank you for the opportunity to participate in the care of VANA ARIF Please call our office at [phone] if we  can be of additional assistance.  Note: Portions of this note were generated with Lobbyist. Dictation errors may occur despite best attempts at proofreading.  Teodoro Spray, NP

## 2020-11-07 NOTE — Progress Notes (Signed)
HEMATOLOGY/ONCOLOGY CLINIC NOTE  Date of Service: 11/08/2020  Patient Care Team: [name], NP as PCP - General (Adult Health Nurse Practitioner)  CHIEF COMPLAINTS/PURPOSE OF CONSULTATION:  mx of myeloma  HISTORY OF PRESENTING ILLNESS:  Lisa Lester is a wonderful 75 y.o. female who has been referred to [place] by Dr. 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 Pt is accompanied today by her nephew. The pt reports that she is doing well overall.   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 To treat this the pt had a Kyphoplasty and radiation therapy. Pt also had a BM Bx, urinalysis, and a bone scan completed at the time. There was no official diagnosis of MGUS or Multiple Myeloma at this time, but they continued to monitor.   A few weeks ago ptt began having significant pain in her chest, which was thought to be a fractured sternum. Pt describes the pain as "flesh being ripped off of her bones". She was also having left-sided neck and back discomfort at this time. Pt was taken to the hospital where her pain was treated and she received a right Iliac Bx, BM Bx, and CT Chest while admitted. They did not complete any whole body imaging at this time.   She had a root canal nearly 1.5 months ago. Pt discontinued taking Thyroid replacement 5-6 years ago. Pt is also no longer taking any HTN medications. She was asked to restart Gabapentin yesterday, which she was given for pain control, but felt ill after taking it. She continues taking Xanax and Hydrocodone every 4 hours. Pt reports some constipation and takes Colace occasionally.   Pt had COVID19 in July of 2020. Her husband also had the virus which caused his passing. Pt has received two doses of the COVID19 vaccine but has not received the booster.  Of note since the patient's last visit, pt has had CT Chest ([id number]) completed on 04/09/2020 with  results revealing "1. Chronic posterior surgical fusion of L2 and L4 with bilateral transpedicular screws and bridging rods. Hardware intact appearing. 2. Status post vertebroplasty of L3. There is an underlying lytic lesion at the L3 vertebral body which extends into its left pedicle. These findings correlate with the provided history of a known plasmacytoma. 3. There are numerous lytic skeletal lesions at the chest, abdomen and pelvis, more numerous at the spine and ribs. 4. Multiple lytic lesions are present at the sternum, with interval development of a pathologic fracture at the sternum without significant displacement. 5. Stable mild superior endplate compression deformity at T11. 6. 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 Similar findings were present on prior study from 03/30/12, the degree of biliary ductal dilation has worsened as compared to the prior study. No choledochal stone identified.   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  Most recent lab results (04/11/2020) of CBC w/diff is as follows: all values are WNL except for Lymphs Rel  at 18.5. 04/10/2020 CMP is as follows: all values are WNL except for CO2 at 33, Glucose at 120, Calcium at 11.6, Anion gap at 3.  On review of systems, pt reports constipation, chest pain, neck pain, back pain and denies dysuria and any other symptoms.   On PMHx the pt reports Kyphoplasty,  Plasmacytoma, HTN, Thyroid disorder. On Social Hx the pt reports that she is a non-smoker and only drinks alcohol socially.   INTERVAL HISTORY:  Lisa Lester is a wonderful 75 y.o. 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 The patient's last visit with [place] was on 10/09/2020. The pt reports that she is doing well overall. We are joined today by her family member.  The pt reports that she continues to have head and neck pain. The pt notes she saw the neurosurgeon, who mentioned she may need surgery in a few months. The pt notes they did not recommend any further scans or imaging. The pt notes that she will be going home to [facility] next week and following up with doctors there. The pt notes that she has been eating better and her taste buds have been fluctuating. The pt has a PCP down there as well, but has not made an appointment yet. She does have an appointment with her pain management doctor.  Lab results today 11/08/2020 of CBC w/diff and CMP is as follows: all values are WNL except for WBC of 3.4K, RBC of 3.63, Hgb of 11.7, Plt of 117K, Calcium of 8.3, Total protein of 5.7, AST of 92, ALT of 85, Alkaline Phosphatase of 155.  On review of systems, pt reports continued neck and head pain and denies back pain, abdominal pain, decreased appetite, leg swelling, and any other symptoms.  MEDICAL HISTORY:  Past Medical History:  Diagnosis Date  . Multiple myeloma ([place])   . Pathologic fracture    L3  . Plasmacytoma ([place])     SURGICAL HISTORY: Past Surgical History:  Procedure Laterality Date  . BACK SURGERY    . BREAST ENHANCEMENT SURGERY Bilateral   . LUMBAR FUSION     L2 - L4, hx of pathologic fx of L3  . RADIOLOGY WITH ANESTHESIA N/A 06/13/2020   Procedure: MRI BRAIN WITHOUT; C-SPINE WITHOUT;  Surgeon: Radiologist, Medication, MD;  Location: [place];  Service: Radiology;  Laterality: N/A;    SOCIAL HISTORY: Social History   Socioeconomic History  .  Marital status: Widowed    Spouse name: russell Sada  . Number of children: 0  . Years of education: 79  . Highest education level: 12th grade  Occupational History  . Not on file  Tobacco Use  . Smoking status: Never Smoker  . Smokeless tobacco: Never Used  Vaping Use  . Vaping Use: Never used  Substance and Sexual Activity  . Alcohol use: Never  . Drug use: Never  . Sexual activity: Not Currently  Other Topics Concern  . Not on file  Social History Narrative  . Not on file   Social Determinants of Health   Financial Resource Strain: Not on file  Food Insecurity: Not on file  Transportation Needs: Not on file  Physical Activity: Not on file  Stress: Not on file  Social Connections: Not on file  Intimate Partner Violence: Not on file    FAMILY HISTORY: Family History  Problem Relation Age of Onset  . Cancer - Other Mother   . Cancer - Other Father     ALLERGIES:  has No Known Allergies.  MEDICATIONS:  Current Outpatient Medications  Medication Sig Dispense Refill  . acyclovir (ZOVIRAX) 400 MG tablet TAKE 1 TABLET BY MOUTH TWICE A DAY 180 tablet 1  . ALPRAZolam (XANAX) 0.5 MG tablet Take 1 tablet (0.5 mg total) by mouth 3 (three) times daily as needed for anxiety. 15 tablet 0  . aspirin EC 81 MG tablet Take 81 mg by mouth daily. Swallow whole.    . Cyanocobalamin (B-12) 1000 MCG SUBL Place 1,000 mcg under the tongue daily. 60 tablet 2  . cyclobenzaprine (FLEXERIL) 5 MG tablet Take 1 tablet (5 mg total) by mouth 3 (three) times daily as needed for muscle spasms. 30 tablet 0  . ergocalciferol (VITAMIN D2) 1.25 MG (50000 UT) capsule Take 1 capsule (50,000 Units total) by mouth 2 (two) times a week. 16 capsule 2  . fentaNYL (DURAGESIC) 25 MCG/HR Place 1 patch onto the skin every 3 (three) days. 10 patch 0  . furosemide (LASIX) 20 MG tablet TAKE 1 TABLET BY MOUTH EVERY DAY 90 tablet 1  . hydrochlorothiazide (HYDRODIURIL) 12.5 MG tablet Take 1 tablet (12.5 mg total) by  mouth daily. 30 tablet 0  . lenalidomide (REVLIMID) 15 MG capsule TAKE 1 CAPSULE BY MOUTH  DAILY FOR 14 DAYS, THEN 7  DAYS OFF 14 capsule 0  . meloxicam (MOBIC) 15 MG tablet TAKE 1 TABLET BY MOUTH EVERY DAY 30 tablet 0  . ondansetron (ZOFRAN) 8 MG tablet Take 1 tablet (8 mg total) by mouth every 8 (eight) hours as needed for nausea or vomiting. 20 tablet 0  . oxyCODONE-acetaminophen (PERCOCET/ROXICET) 5-325 MG tablet Take 1-2 tablets by mouth every 6 (six) hours as needed for moderate pain or severe pain. 120 tablet 0  . pantoprazole (PROTONIX) 40 MG tablet Take 1 tablet (40 mg total) by mouth daily. 30 tablet 1  . polyethylene glycol (MIRALAX) 17 g packet Take 17 g by mouth daily. (Patient taking differently: Take 17 g by mouth daily as needed for mild constipation.) 30 each 1  . pregabalin (LYRICA) 25 MG capsule Take 1 capsule (25 mg total) by mouth 2 (two) times daily. 60 capsule 1  . prochlorperazine (COMPAZINE) 10 MG tablet Take 1 tablet (10 mg total) by mouth every 6 (six) hours as needed for nausea or vomiting. 30 tablet 0  . senna-docusate (SENNA S) 8.6-50 MG tablet Take 2 tablets by mouth 2 (two) times daily. 60 tablet 2   No current facility-administered medications for this visit.    REVIEW OF SYSTEMS:   10 Point review of Systems was done is negative except as noted above.  PHYSICAL EXAMINATION: ECOG PERFORMANCE STATUS: 2 - Symptomatic, <50% confined to bed  . Vitals:   11/08/20 0930  BP: 125/60  Pulse: 72  Resp: 18  Temp: (!) 97.2 F (36.2 C)  SpO2: 100%   Filed Weights   11/08/20 0930  Weight: 139 lb 3.2 oz (63.1 kg)   .Body mass index is 23.16 kg/m.   Exam was given in a chair.  GENERAL:alert, in no acute distress and comfortable SKIN: no acute rashes, no significant lesions EYES: conjunctiva are pink and non-injected, sclera anicteric OROPHARYNX: MMM, no exudates, no oropharyngeal erythema or ulceration NECK: supple, no JVD LYMPH:  no palpable  lymphadenopathy in the cervical, axillary or inguinal regions LUNGS: clear to auscultation b/l with normal respiratory effort HEART: regular rate & rhythm ABDOMEN:  normoactive bowel sounds , non tender, not distended. Extremity: minimal leg swelling PSYCH: alert &  oriented x 3 with fluent speech NEURO: no focal motor/sensory deficits  LABORATORY DATA:  I have reviewed the data as listed  . CBC Latest Ref Rng & Units 11/08/2020 10/09/2020 10/03/2020  WBC 4.0 - 10.5 K/uL 3.4(L) 2.4(L) 2.9(L)  Hemoglobin 12.0 - 15.0 g/dL 11.7(L) 12.0 11.8(L)  Hematocrit 36.0 - 46.0 % 35.7(L) 36.2 35.8(L)  Platelets 150 - 400 K/uL 117(L) 233 270    . CMP Latest Ref Rng & Units 11/08/2020 10/09/2020 09/24/2020  Glucose 70 - 99 mg/dL 83 104(H) 93  BUN 8 - 23 mg/dL '15 13 12  ' Creatinine 0.44 - 1.00 mg/dL 0.93 1.02(H) 0.77  Sodium 135 - 145 mmol/L 138 141 143  Potassium 3.5 - 5.1 mmol/L 3.8 5.2(H) 3.9  Chloride 98 - 111 mmol/L 104 99 107  CO2 22 - 32 mmol/L '28 31 26  ' Calcium 8.9 - 10.3 mg/dL 8.3(L) 9.0 7.3(L)  Total Protein 6.5 - 8.1 g/dL 5.7(L) 6.1(L) 5.5(L)  Total Bilirubin 0.3 - 1.2 mg/dL 0.4 0.4 0.5  Alkaline Phos 38 - 126 U/L 155(H) 72 77  AST 15 - 41 U/L 92(H) 11(L) 8(L)  ALT 0 - 44 U/L 85(H) <6 <6      SURGICAL PATHOLOGY Surgical Pathology   THIS IS AN ADDENDUM REPORT   CASE: [id number]  PATIENT: Lisa Lester  Bone Marrow Report  Addendum    Reason for Addendum #1: Cytogenetics results   Clinical History: Myeloma      DIAGNOSIS:   BONE MARROW, ASPIRATE, CLOT, CORE:  - Normocellular bone marrow with erythroid-predominant trilineage  hematopoiesis and no increase in plasma cells  - See comment   PERIPHERAL BLOOD:  - Leukopenia  - Normocytic anemia  - See CBC data    COMMENT:   Plasma cells are not increased on aspirate smears (2% by manual  differential count) or by CD138 immunohistochemistry on the clot or core  biopsy (<1%). No light chain restricted plasma cell  population is  identified by kappa/lambda light chain in situ hybridization. There is  no morphologic evidence of involvement by plasma cell neoplasm.  Correlation with clinical findings, other laboratory data, and pending  cytogenetics/FISH results is recommended.     RADIOGRAPHIC STUDIES: I have personally reviewed the radiological images as listed and agreed with the findings in the report. CLINICAL DATA:  Subsequent treatment strategy for multiple myeloma.  EXAM: NUCLEAR MEDICINE PET WHOLE BODY  TECHNIQUE: 7.27 mCi F-18 FDG was injected intravenously. Full-ring PET imaging was performed from the head to foot after the radiotracer. CT data was obtained and used for attenuation correction and anatomic localization.  Fasting blood glucose: 94 mg/dl  COMPARISON:  05/24/2020  FINDINGS: Mediastinal blood pool activity: SUV max 2.4  HEAD/NECK: No hypermetabolic activity in the scalp. No hypermetabolic cervical lymph nodes.  Incidental CT findings: none  CHEST: No hypermetabolic mediastinal or hilar nodes. No suspicious pulmonary nodules on the CT scan.  Incidental CT findings: Small right pleural effusion. Aortic atherosclerosis. Coronary artery calcifications. Bilateral breast augmentation.  ABDOMEN/PELVIS: No abnormal hypermetabolic activity within the liver, pancreas, adrenal glands, or spleen. No hypermetabolic lymph nodes in the abdomen or pelvis.  Incidental CT findings: Aortic atherosclerosis. No aneurysm. Right lobe of liver cyst, image 123/4. Distal colonic diverticulosis noted. No acute inflammation.  SKELETON:  Index lesion involving the left lateral mass of C1 has an SUV max of 6.7. Previously 15.6.  Index lesion involving the C4 vertebral body has an SUV max of 2.06. Previously 6.4.  Index lesion involving the  lower sternum with associated pathologic fracture has an SUV max of 2.31. Previously 6.65.  Index lesion involving the left  fifth rib has an SUV max of 2.04 on today's study. Previously 9.4.  Incidental CT findings: none  EXTREMITIES: Index lesion involving the distal right humeral shaft has an SUV max of 0.98. Previously 3.9.  Subcutaneous nodule within the anterior fat of the mid right thigh measures 1.6 cm within SUV max of 1.46. Previously this measured 1.3 cm with SUV max of 3.4.  Incidental CT findings: none  IMPRESSION: 1. Interval response to therapy. There has been significant interval decrease in the degree of FDG uptake associated with multiple lesions of myeloma. Persistent but improved uptake involving the lateral mass of C1 has an SUV max of 6.7 consistent with residual metabolically active disease. 2. No new or progressive sites of disease. 3. Aortic Atherosclerosis (ICD10-I70.0). Coronary artery calcifications.   Electronically Signed   By: Kerby Moors M.D.   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  Previous h/o plasmacytomas 05/15/2020 M Protein at 0.7 g/dL 05/24/2020 PET/CT ([id number]) revealed "1. There are multiple hypermetabolic lytic osseous lesions consistent with multiple myeloma. The most prominent lesion is within the left lateral mass of C1 and this likely involves the foramen transversarium. Probable T3 and sternal pathologic fractures."  2) C1 myeloma lesion status post palliative radiation with improvement in pain  3) Elevated LFTs PLAN: -Discussed pt labwork today, 11/08/2020; blood counts stable, mild thrombocytopenia, mild elevation in liver enzymes. -Recommended pt see an oncologist within a month of moving back home. -Advised pt our plan is to continue the maintenance Revlimid and Zometa. -Recommended pt connect with PCP and set up an appointment. This will be important for all non-cancer medications. -Advised pt to connect  with neurosurgeon regarding finding a better muscle relaxer that works for her. Recommended pt ask regarding possibility of trigger point injection or steroid injection. -Advised pt to hold Meloxicam at this time due to elevation in liver enzymes. Hold all alcohol consumption as well. -Recommended OTC Voltaren gel for local pain reduction in pt's neck pain. -Recommended that the pt continue to eat well, drink at least 48-64 oz of water each day, and walk 20-30 minutes each day.  -Continue 1000 mcg Vitamin B12 & one B-complex daily.  -Continue daily baby ASA and Acyclovir BID. -The pt has no prohibitive toxicities from continuing 15 mg maintenance Revlimid 3 weeks on, 1 week off at this time. -Continue Zometa q4weeks at this time. -Pt will be seeing Dr. Macky Lower at [facility] in [place], [place]. -Will Refill oxycodone one more time. Will switch from Percocet to Oxycodone.   FOLLOW UP: Patient is transferring to [facility] to Dr Macky Lower, [facility]   The total time spent in the appointment was 40 minutes and more than 50% was on counseling and direct patient cares.   All of the patient's questions were answered with apparent satisfaction. The patient knows to call the clinic with any problems, questions or concerns.    Sullivan Lone MD [place] AAHIVMS [facility] Hematology/Oncology Physician [facility]  (Office):       [phone] (Work cell):  [phone] (Fax):           [phone]  11/08/2020 10:13 AM   I, Reinaldo Raddle, am acting as scribe for Dr. Sullivan Lone, MD.  .I have  reviewed the above documentation for accuracy and completeness, and I agree with the above. Brunetta Genera MD

## 2020-11-08 ENCOUNTER — Other Ambulatory Visit: Payer: Self-pay

## 2020-11-08 ENCOUNTER — Inpatient Hospital Stay (HOSPITAL_BASED_OUTPATIENT_CLINIC_OR_DEPARTMENT_OTHER): Payer: Medicare Other | Admitting: Hematology

## 2020-11-08 ENCOUNTER — Inpatient Hospital Stay: Payer: Medicare Other

## 2020-11-08 ENCOUNTER — Inpatient Hospital Stay: Payer: Medicare Other | Attending: Hematology

## 2020-11-08 DIAGNOSIS — Z923 Personal history of irradiation: Secondary | ICD-10-CM | POA: Diagnosis not present

## 2020-11-08 DIAGNOSIS — D696 Thrombocytopenia, unspecified: Secondary | ICD-10-CM | POA: Diagnosis not present

## 2020-11-08 DIAGNOSIS — R079 Chest pain, unspecified: Secondary | ICD-10-CM | POA: Diagnosis not present

## 2020-11-08 DIAGNOSIS — C9000 Multiple myeloma not having achieved remission: Secondary | ICD-10-CM

## 2020-11-08 DIAGNOSIS — Z8616 Personal history of COVID-19: Secondary | ICD-10-CM | POA: Insufficient documentation

## 2020-11-08 DIAGNOSIS — Z79899 Other long term (current) drug therapy: Secondary | ICD-10-CM | POA: Diagnosis not present

## 2020-11-08 DIAGNOSIS — M542 Cervicalgia: Secondary | ICD-10-CM | POA: Diagnosis not present

## 2020-11-08 DIAGNOSIS — Z7982 Long term (current) use of aspirin: Secondary | ICD-10-CM | POA: Diagnosis not present

## 2020-11-08 DIAGNOSIS — K5900 Constipation, unspecified: Secondary | ICD-10-CM | POA: Diagnosis not present

## 2020-11-08 DIAGNOSIS — M549 Dorsalgia, unspecified: Secondary | ICD-10-CM | POA: Diagnosis not present

## 2020-11-08 DIAGNOSIS — R7989 Other specified abnormal findings of blood chemistry: Secondary | ICD-10-CM | POA: Insufficient documentation

## 2020-11-08 DIAGNOSIS — Z5111 Encounter for antineoplastic chemotherapy: Secondary | ICD-10-CM

## 2020-11-08 LAB — CMP (CANCER CENTER ONLY)
ALT: 85 U/L — ABNORMAL HIGH (ref 0–44)
AST: 92 U/L — ABNORMAL HIGH (ref 15–41)
Albumin: 3.5 g/dL (ref 3.5–5.0)
Alkaline Phosphatase: 155 U/L — ABNORMAL HIGH (ref 38–126)
Anion gap: 6 (ref 5–15)
BUN: 15 mg/dL (ref 8–23)
CO2: 28 mmol/L (ref 22–32)
Calcium: 8.3 mg/dL — ABNORMAL LOW (ref 8.9–10.3)
Chloride: 104 mmol/L (ref 98–111)
Creatinine: 0.93 mg/dL (ref 0.44–1.00)
GFR, Estimated: >60 mL/min (ref >60)
Glucose, Bld: 83 mg/dL (ref 70–99)
Potassium: 3.8 mmol/L (ref 3.5–5.1)
Sodium: 138 mmol/L (ref 135–145)
Total Bilirubin: 0.4 mg/dL (ref 0.3–1.2)
Total Protein: 5.7 g/dL — ABNORMAL LOW (ref 6.5–8.1)

## 2020-11-08 LAB — CBC WITH DIFFERENTIAL/PLATELET
Abs Immature Granulocytes: 0.02 10*3/uL (ref 0.00–0.07)
Basophils Absolute: 0.1 10*3/uL (ref 0.0–0.1)
Basophils Relative: 2 %
Eosinophils Absolute: 0.2 10*3/uL (ref 0.0–0.5)
Eosinophils Relative: 6 %
HCT: 35.7 % — ABNORMAL LOW (ref 36.0–46.0)
Hemoglobin: 11.7 g/dL — ABNORMAL LOW (ref 12.0–15.0)
Immature Granulocytes: 1 %
Lymphocytes Relative: 21 %
Lymphs Abs: 0.7 10*3/uL (ref 0.7–4.0)
MCH: 32.2 pg (ref 26.0–34.0)
MCHC: 32.8 g/dL (ref 30.0–36.0)
MCV: 98.3 fL (ref 80.0–100.0)
Monocytes Absolute: 0.5 10*3/uL (ref 0.1–1.0)
Monocytes Relative: 14 %
Neutro Abs: 1.9 10*3/uL (ref 1.7–7.7)
Neutrophils Relative %: 56 %
Platelets: 117 10*3/uL — ABNORMAL LOW (ref 150–400)
RBC: 3.63 MIL/uL — ABNORMAL LOW (ref 3.87–5.11)
RDW: 13.4 % (ref 11.5–15.5)
WBC: 3.4 10*3/uL — ABNORMAL LOW (ref 4.0–10.5)
nRBC: 0 % (ref 0.0–0.2)

## 2020-11-08 MED ORDER — SODIUM CHLORIDE 0.9 % IV SOLN
Freq: Once | INTRAVENOUS | Status: AC
  Filled 2020-11-08: qty 250

## 2020-11-08 MED ORDER — ZOLEDRONIC ACID 4 MG/100ML IV SOLN
INTRAVENOUS | Status: AC
  Filled 2020-11-08: qty 100

## 2020-11-08 MED ORDER — ZOLEDRONIC ACID 4 MG/100ML IV SOLN
4.0000 mg | Freq: Once | INTRAVENOUS | Status: AC
  Administered 2020-11-08: 4 mg via INTRAVENOUS

## 2020-11-08 MED ORDER — OXYCODONE HCL 5 MG PO TABS: 5.0000 mg | ORAL_TABLET | Freq: Four times a day (QID) | ORAL | 0 refills | Status: AC | PRN

## 2020-11-08 NOTE — Progress Notes (Signed)
Ok to proceed with Zometa with Calcium 8.3 per Dr. Irene Limbo.

## 2020-11-08 NOTE — Patient Instructions (Signed)
Zoledronic Acid Injection (Hypercalcemia, Oncology) What is this medicine? ZOLEDRONIC ACID (ZOE le dron ik AS id) slows calcium loss from bones. It high calcium levels in the blood from some kinds of cancer. It may be used in other people at risk for bone loss. This medicine may be used for other purposes; ask your health care provider or pharmacist if you have questions. COMMON BRAND NAME(S): Zometa What should I tell my health care provider before I take this medicine? They need to know if you have any of these conditions:  cancer  dehydration  dental disease  kidney disease  liver disease  low levels of calcium in the blood  lung or breathing disease (asthma)  receiving steroids like dexamethasone or prednisone  an unusual or allergic reaction to zoledronic acid, other medicines, foods, dyes, or preservatives  pregnant or trying to get pregnant  breast-feeding How should I use this medicine? This drug is injected into a vein. It is given by a health care provider in a hospital or clinic setting. Talk to your health care provider about the use of this drug in children. Special care may be needed. Overdosage: If you think you have taken too much of this medicine contact a poison control center or emergency room at once. NOTE: This medicine is only for you. Do not share this medicine with others. What if I miss a dose? Keep appointments for follow-up doses. It is important not to miss your dose. Call your health care provider if you are unable to keep an appointment. What may interact with this medicine?  certain antibiotics given by injection  NSAIDs, medicines for pain and inflammation, like ibuprofen or naproxen  some diuretics like bumetanide, furosemide  teriparatide  thalidomide This list may not describe all possible interactions. Give your health care provider a list of all the medicines, herbs, non-prescription drugs, or dietary supplements you use. Also tell  them if you smoke, drink alcohol, or use illegal drugs. Some items may interact with your medicine. What should I watch for while using this medicine? Visit your health care provider for regular checks on your progress. It may be some time before you see the benefit from this drug. Some people who take this drug have severe bone, joint, or muscle pain. This drug may also increase your risk for jaw problems or a broken thigh bone. Tell your health care provider right away if you have severe pain in your jaw, bones, joints, or muscles. Tell you health care provider if you have any pain that does not go away or that gets worse. Tell your dentist and dental surgeon that you are taking this drug. You should not have major dental surgery while on this drug. See your dentist to have a dental exam and fix any dental problems before starting this drug. Take good care of your teeth while on this drug. Make sure you see your dentist for regular follow-up appointments. You should make sure you get enough calcium and vitamin D while you are taking this drug. Discuss the foods you eat and the vitamins you take with your health care provider. Check with your health care provider if you have severe diarrhea, nausea, and vomiting, or if you sweat a lot. The loss of too much body fluid may make it dangerous for you to take this drug. You may need blood work done while you are taking this drug. Do not become pregnant while taking this drug. Women should inform their health care provider   if they wish to become pregnant or think they might be pregnant. There is potential for serious harm to an unborn child. Talk to your health care provider for more information. What side effects may I notice from receiving this medicine? Side effects that you should report to your doctor or health care provider as soon as possible:  allergic reactions (skin rash, itching or hives; swelling of the face, lips, or tongue)  bone  pain  infection (fever, chills, cough, sore throat, pain or trouble passing urine)  jaw pain, especially after dental work  joint pain  kidney injury (trouble passing urine or change in the amount of urine)  low blood pressure (dizziness; feeling faint or lightheaded, falls; unusually weak or tired)  low calcium levels (fast heartbeat; muscle cramps or pain; pain, tingling, or numbness in the hands or feet; seizures)  low magnesium levels (fast, irregular heartbeat; muscle cramp or pain; muscle weakness; tremors; seizures)  low red blood cell counts (trouble breathing; feeling faint; lightheaded, falls; unusually weak or tired)  muscle pain  redness, blistering, peeling, or loosening of the skin, including inside the mouth  severe diarrhea  swelling of the ankles, feet, hands  trouble breathing Side effects that usually do not require medical attention (report to your doctor or health care provider if they continue or are bothersome):  anxious  constipation  coughing  depressed mood  eye irritation, itching, or pain  fever  general ill feeling or flu-like symptoms  nausea  pain, redness, or irritation at site where injected  trouble sleeping This list may not describe all possible side effects. Call your doctor for medical advice about side effects. You may report side effects to FDA at 1-800-FDA-1088. Where should I keep my medicine? This drug is given in a hospital or clinic. It will not be stored at home. NOTE: This sheet is a summary. It may not cover all possible information. If you have questions about this medicine, talk to your doctor, pharmacist, or health care provider.  2021 Elsevier/Gold Standard (2019-03-30 09:13:00)  

## 2020-11-10 ENCOUNTER — Other Ambulatory Visit: Payer: Self-pay | Admitting: Hematology

## 2020-11-10 DIAGNOSIS — C9000 Multiple myeloma not having achieved remission: Secondary | ICD-10-CM

## 2020-11-13 ENCOUNTER — Other Ambulatory Visit: Payer: Self-pay | Admitting: Registered Nurse

## 2020-11-13 DIAGNOSIS — R609 Edema, unspecified: Secondary | ICD-10-CM

## 2020-11-17 ENCOUNTER — Other Ambulatory Visit: Payer: Self-pay | Admitting: Registered Nurse

## 2020-11-18 NOTE — Telephone Encounter (Signed)
The original prescription was discontinued on 11/08/2020 by Brunetta Genera, MD. Renewing this prescription may not be appropriate.

## 2020-11-22 ENCOUNTER — Telehealth: Payer: Self-pay | Admitting: Registered Nurse

## 2020-11-22 NOTE — Telephone Encounter (Signed)
Spoke with patient's niece she state patient moved back to [facility].

## 2020-12-03 ENCOUNTER — Other Ambulatory Visit: Payer: Self-pay | Admitting: Hematology

## 2020-12-03 DIAGNOSIS — C9000 Multiple myeloma not having achieved remission: Secondary | ICD-10-CM

## 2020-12-06 ENCOUNTER — Other Ambulatory Visit: Payer: Self-pay | Admitting: Registered Nurse

## 2020-12-06 DIAGNOSIS — R609 Edema, unspecified: Secondary | ICD-10-CM

## 2020-12-20 ENCOUNTER — Other Ambulatory Visit: Payer: Self-pay | Admitting: Registered Nurse

## 2020-12-20 ENCOUNTER — Other Ambulatory Visit: Payer: Self-pay | Admitting: Hematology

## 2020-12-20 DIAGNOSIS — C9000 Multiple myeloma not having achieved remission: Secondary | ICD-10-CM

## 2020-12-20 DIAGNOSIS — Z7189 Other specified counseling: Secondary | ICD-10-CM

## 2020-12-20 DIAGNOSIS — R609 Edema, unspecified: Secondary | ICD-10-CM

## 2020-12-26 ENCOUNTER — Other Ambulatory Visit: Payer: Self-pay | Admitting: Hematology

## 2020-12-26 DIAGNOSIS — C9000 Multiple myeloma not having achieved remission: Secondary | ICD-10-CM

## 2021-01-14 ENCOUNTER — Other Ambulatory Visit: Payer: Self-pay | Admitting: Hematology

## 2021-01-14 DIAGNOSIS — C9000 Multiple myeloma not having achieved remission: Secondary | ICD-10-CM

## 2021-01-21 ENCOUNTER — Other Ambulatory Visit: Payer: Self-pay | Admitting: Hematology

## 2021-02-07 ENCOUNTER — Other Ambulatory Visit: Payer: Self-pay | Admitting: Hematology

## 2021-02-07 DIAGNOSIS — C9000 Multiple myeloma not having achieved remission: Secondary | ICD-10-CM

## 2021-02-10 NOTE — Progress Notes (Signed)
Attempted to contact pt to verify that pt has moved and transferred her care to a [facility] provider. No answer.

## 2021-02-11 NOTE — Progress Notes (Signed)
Verified pt has transferred her care to an MD in [place]. We do not have a current address for this pt. Revlimid RX taken ove rby new Oncologist/ Confirmed by [employer]

## 2021-12-03 ENCOUNTER — Other Ambulatory Visit: Payer: Self-pay | Admitting: Oncology
